# Patient Record
Sex: Female | Born: 1965
Health system: Southern US, Community
[De-identification: ages and names within clinical notes are randomized; demographics above are authoritative.]

## PROBLEM LIST (undated history)

## (undated) DIAGNOSIS — K8020 Calculus of gallbladder without cholecystitis without obstruction: Secondary | ICD-10-CM

## (undated) DIAGNOSIS — I10 Essential (primary) hypertension: Secondary | ICD-10-CM

## (undated) DIAGNOSIS — E785 Hyperlipidemia, unspecified: Secondary | ICD-10-CM

## (undated) DIAGNOSIS — E119 Type 2 diabetes mellitus without complications: Secondary | ICD-10-CM

## (undated) HISTORY — PX: TUBAL LIGATION: SHX77

## (undated) HISTORY — DX: Calculus of gallbladder without cholecystitis without obstruction: K80.20

## (undated) HISTORY — DX: Type 2 diabetes mellitus without complications: E11.9

## (undated) HISTORY — DX: Hyperlipidemia, unspecified: E78.5

## (undated) HISTORY — PX: CHOLECYSTECTOMY: SHX55

---

## 1997-07-08 ENCOUNTER — Encounter: Admission: RE | Admit: 1997-07-08 | Discharge: 1997-07-08 | Payer: Self-pay | Admitting: Family Medicine

## 1997-07-11 ENCOUNTER — Encounter: Admission: RE | Admit: 1997-07-11 | Discharge: 1997-07-11 | Payer: Self-pay | Admitting: Family Medicine

## 1997-09-26 ENCOUNTER — Emergency Department (HOSPITAL_COMMUNITY): Admission: EM | Admit: 1997-09-26 | Discharge: 1997-09-26 | Payer: Self-pay | Admitting: Emergency Medicine

## 1997-11-04 ENCOUNTER — Encounter: Admission: RE | Admit: 1997-11-04 | Discharge: 1997-11-04 | Payer: Self-pay | Admitting: Family Medicine

## 1997-11-25 ENCOUNTER — Encounter: Admission: RE | Admit: 1997-11-25 | Discharge: 1997-11-25 | Payer: Self-pay | Admitting: Family Medicine

## 1997-12-25 ENCOUNTER — Emergency Department (HOSPITAL_COMMUNITY): Admission: EM | Admit: 1997-12-25 | Discharge: 1997-12-25 | Payer: Self-pay | Admitting: Emergency Medicine

## 1997-12-25 ENCOUNTER — Encounter: Payer: Self-pay | Admitting: Emergency Medicine

## 1998-06-27 ENCOUNTER — Encounter: Admission: RE | Admit: 1998-06-27 | Discharge: 1998-06-27 | Payer: Self-pay | Admitting: Family Medicine

## 1998-07-17 ENCOUNTER — Encounter: Admission: RE | Admit: 1998-07-17 | Discharge: 1998-07-17 | Payer: Self-pay | Admitting: Family Medicine

## 1998-08-01 ENCOUNTER — Encounter: Admission: RE | Admit: 1998-08-01 | Discharge: 1998-08-01 | Payer: Self-pay | Admitting: Family Medicine

## 1998-09-14 ENCOUNTER — Encounter: Admission: RE | Admit: 1998-09-14 | Discharge: 1998-09-14 | Payer: Self-pay | Admitting: Family Medicine

## 1998-09-20 ENCOUNTER — Encounter: Admission: RE | Admit: 1998-09-20 | Discharge: 1998-09-20 | Payer: Self-pay | Admitting: Sports Medicine

## 1998-12-13 ENCOUNTER — Emergency Department (HOSPITAL_COMMUNITY): Admission: EM | Admit: 1998-12-13 | Discharge: 1998-12-13 | Payer: Self-pay | Admitting: Emergency Medicine

## 1999-07-31 ENCOUNTER — Encounter: Admission: RE | Admit: 1999-07-31 | Discharge: 1999-07-31 | Payer: Self-pay | Admitting: Family Medicine

## 1999-11-06 ENCOUNTER — Emergency Department (HOSPITAL_COMMUNITY): Admission: EM | Admit: 1999-11-06 | Discharge: 1999-11-06 | Payer: Self-pay | Admitting: Emergency Medicine

## 2000-11-28 ENCOUNTER — Encounter: Admission: RE | Admit: 2000-11-28 | Discharge: 2000-11-28 | Payer: Self-pay | Admitting: Family Medicine

## 2001-02-06 ENCOUNTER — Emergency Department (HOSPITAL_COMMUNITY): Admission: EM | Admit: 2001-02-06 | Discharge: 2001-02-06 | Payer: Self-pay | Admitting: Emergency Medicine

## 2001-04-23 ENCOUNTER — Encounter: Admission: RE | Admit: 2001-04-23 | Discharge: 2001-04-23 | Payer: Self-pay | Admitting: Family Medicine

## 2001-08-05 ENCOUNTER — Encounter: Admission: RE | Admit: 2001-08-05 | Discharge: 2001-08-05 | Payer: Self-pay | Admitting: Family Medicine

## 2001-09-01 ENCOUNTER — Inpatient Hospital Stay (HOSPITAL_COMMUNITY): Admission: AD | Admit: 2001-09-01 | Discharge: 2001-09-01 | Payer: Self-pay | Admitting: *Deleted

## 2001-09-11 ENCOUNTER — Encounter: Admission: RE | Admit: 2001-09-11 | Discharge: 2001-09-11 | Payer: Self-pay | Admitting: Family Medicine

## 2001-10-15 ENCOUNTER — Ambulatory Visit (HOSPITAL_COMMUNITY): Admission: RE | Admit: 2001-10-15 | Discharge: 2001-10-15 | Payer: Self-pay | Admitting: Family Medicine

## 2001-10-20 ENCOUNTER — Encounter: Admission: RE | Admit: 2001-10-20 | Discharge: 2001-10-20 | Payer: Self-pay | Admitting: Family Medicine

## 2001-10-27 ENCOUNTER — Inpatient Hospital Stay (HOSPITAL_COMMUNITY): Admission: RE | Admit: 2001-10-27 | Discharge: 2001-10-27 | Payer: Self-pay | Admitting: *Deleted

## 2001-12-09 ENCOUNTER — Encounter: Admission: RE | Admit: 2001-12-09 | Discharge: 2001-12-09 | Payer: Self-pay | Admitting: Family Medicine

## 2001-12-09 ENCOUNTER — Inpatient Hospital Stay (HOSPITAL_COMMUNITY): Admission: AD | Admit: 2001-12-09 | Discharge: 2001-12-09 | Payer: Self-pay | Admitting: Obstetrics and Gynecology

## 2001-12-09 ENCOUNTER — Encounter: Payer: Self-pay | Admitting: Obstetrics and Gynecology

## 2002-01-08 ENCOUNTER — Encounter: Admission: RE | Admit: 2002-01-08 | Discharge: 2002-01-08 | Payer: Self-pay | Admitting: Family Medicine

## 2002-01-15 ENCOUNTER — Encounter: Admission: RE | Admit: 2002-01-15 | Discharge: 2002-01-15 | Payer: Self-pay | Admitting: Family Medicine

## 2002-01-19 ENCOUNTER — Encounter: Admission: RE | Admit: 2002-01-19 | Discharge: 2002-04-19 | Payer: Self-pay | Admitting: *Deleted

## 2002-01-26 ENCOUNTER — Encounter: Admission: RE | Admit: 2002-01-26 | Discharge: 2002-01-26 | Payer: Self-pay | Admitting: Family Medicine

## 2002-02-08 ENCOUNTER — Encounter: Admission: RE | Admit: 2002-02-08 | Discharge: 2002-02-08 | Payer: Self-pay | Admitting: Family Medicine

## 2002-02-11 ENCOUNTER — Ambulatory Visit (HOSPITAL_COMMUNITY): Admission: RE | Admit: 2002-02-11 | Discharge: 2002-02-11 | Payer: Self-pay | Admitting: Family Medicine

## 2002-02-11 ENCOUNTER — Encounter: Payer: Self-pay | Admitting: Family Medicine

## 2002-02-22 ENCOUNTER — Encounter (INDEPENDENT_AMBULATORY_CARE_PROVIDER_SITE_OTHER): Payer: Self-pay | Admitting: Specialist

## 2002-02-22 ENCOUNTER — Inpatient Hospital Stay (HOSPITAL_COMMUNITY): Admission: AD | Admit: 2002-02-22 | Discharge: 2002-02-26 | Payer: Self-pay | Admitting: *Deleted

## 2002-02-23 ENCOUNTER — Encounter: Payer: Self-pay | Admitting: *Deleted

## 2002-02-24 ENCOUNTER — Encounter: Payer: Self-pay | Admitting: *Deleted

## 2002-02-24 ENCOUNTER — Encounter: Payer: Self-pay | Admitting: Family Medicine

## 2002-02-28 ENCOUNTER — Inpatient Hospital Stay (HOSPITAL_COMMUNITY): Admission: AD | Admit: 2002-02-28 | Discharge: 2002-02-28 | Payer: Self-pay | Admitting: Obstetrics and Gynecology

## 2002-02-28 ENCOUNTER — Encounter: Payer: Self-pay | Admitting: Obstetrics and Gynecology

## 2002-03-03 ENCOUNTER — Encounter: Admission: RE | Admit: 2002-03-03 | Discharge: 2002-03-03 | Payer: Self-pay | Admitting: Family Medicine

## 2002-03-25 ENCOUNTER — Encounter (INDEPENDENT_AMBULATORY_CARE_PROVIDER_SITE_OTHER): Payer: Self-pay | Admitting: *Deleted

## 2002-03-25 LAB — CONVERTED CEMR LAB

## 2002-04-09 ENCOUNTER — Encounter: Admission: RE | Admit: 2002-04-09 | Discharge: 2002-04-09 | Payer: Self-pay | Admitting: Family Medicine

## 2002-04-09 ENCOUNTER — Other Ambulatory Visit: Admission: RE | Admit: 2002-04-09 | Discharge: 2002-04-09 | Payer: Self-pay | Admitting: Family Medicine

## 2002-04-28 ENCOUNTER — Encounter: Admission: RE | Admit: 2002-04-28 | Discharge: 2002-04-28 | Payer: Self-pay | Admitting: Family Medicine

## 2003-11-04 ENCOUNTER — Encounter: Admission: RE | Admit: 2003-11-04 | Discharge: 2003-11-04 | Payer: Self-pay | Admitting: Family Medicine

## 2003-11-07 ENCOUNTER — Encounter: Admission: RE | Admit: 2003-11-07 | Discharge: 2003-11-07 | Payer: Self-pay | Admitting: Family Medicine

## 2004-04-26 ENCOUNTER — Other Ambulatory Visit: Admission: RE | Admit: 2004-04-26 | Discharge: 2004-04-26 | Payer: Self-pay | Admitting: Family Medicine

## 2004-04-26 ENCOUNTER — Ambulatory Visit: Payer: Self-pay | Admitting: Family Medicine

## 2004-12-14 ENCOUNTER — Ambulatory Visit: Payer: Self-pay | Admitting: Family Medicine

## 2005-03-06 ENCOUNTER — Emergency Department (HOSPITAL_COMMUNITY): Admission: EM | Admit: 2005-03-06 | Discharge: 2005-03-06 | Payer: Self-pay | Admitting: Emergency Medicine

## 2005-08-07 ENCOUNTER — Emergency Department (HOSPITAL_COMMUNITY): Admission: EM | Admit: 2005-08-07 | Discharge: 2005-08-07 | Payer: Self-pay | Admitting: Emergency Medicine

## 2006-01-11 ENCOUNTER — Emergency Department (HOSPITAL_COMMUNITY): Admission: EM | Admit: 2006-01-11 | Discharge: 2006-01-11 | Payer: Self-pay | Admitting: Emergency Medicine

## 2006-05-23 ENCOUNTER — Encounter (INDEPENDENT_AMBULATORY_CARE_PROVIDER_SITE_OTHER): Payer: Self-pay | Admitting: *Deleted

## 2006-07-23 ENCOUNTER — Emergency Department (HOSPITAL_COMMUNITY): Admission: EM | Admit: 2006-07-23 | Discharge: 2006-07-23 | Payer: Self-pay | Admitting: Emergency Medicine

## 2007-02-24 ENCOUNTER — Emergency Department (HOSPITAL_COMMUNITY): Admission: EM | Admit: 2007-02-24 | Discharge: 2007-02-24 | Payer: Self-pay | Admitting: Emergency Medicine

## 2007-02-25 ENCOUNTER — Emergency Department (HOSPITAL_COMMUNITY): Admission: EM | Admit: 2007-02-25 | Discharge: 2007-02-25 | Payer: Self-pay | Admitting: Emergency Medicine

## 2007-02-26 ENCOUNTER — Emergency Department (HOSPITAL_COMMUNITY): Admission: EM | Admit: 2007-02-26 | Discharge: 2007-02-26 | Payer: Self-pay | Admitting: Family Medicine

## 2007-09-03 ENCOUNTER — Emergency Department (HOSPITAL_COMMUNITY): Admission: EM | Admit: 2007-09-03 | Discharge: 2007-09-03 | Payer: Self-pay | Admitting: Emergency Medicine

## 2007-09-07 ENCOUNTER — Emergency Department (HOSPITAL_COMMUNITY): Admission: EM | Admit: 2007-09-07 | Discharge: 2007-09-07 | Payer: Self-pay | Admitting: Emergency Medicine

## 2008-06-11 ENCOUNTER — Emergency Department (HOSPITAL_COMMUNITY): Admission: EM | Admit: 2008-06-11 | Discharge: 2008-06-11 | Payer: Self-pay | Admitting: Emergency Medicine

## 2008-06-13 ENCOUNTER — Emergency Department (HOSPITAL_COMMUNITY): Admission: EM | Admit: 2008-06-13 | Discharge: 2008-06-13 | Payer: Self-pay | Admitting: Emergency Medicine

## 2009-05-04 ENCOUNTER — Emergency Department (HOSPITAL_COMMUNITY): Admission: EM | Admit: 2009-05-04 | Discharge: 2009-05-04 | Payer: Self-pay | Admitting: Emergency Medicine

## 2010-03-14 ENCOUNTER — Ambulatory Visit: Payer: Self-pay | Admitting: Family Medicine

## 2010-03-27 ENCOUNTER — Encounter: Payer: Self-pay | Admitting: Family Medicine

## 2010-03-27 DIAGNOSIS — F17200 Nicotine dependence, unspecified, uncomplicated: Secondary | ICD-10-CM | POA: Insufficient documentation

## 2010-03-27 DIAGNOSIS — E669 Obesity, unspecified: Secondary | ICD-10-CM | POA: Insufficient documentation

## 2010-03-27 DIAGNOSIS — R5383 Other fatigue: Secondary | ICD-10-CM

## 2010-03-27 DIAGNOSIS — L301 Dyshidrosis [pompholyx]: Secondary | ICD-10-CM | POA: Insufficient documentation

## 2010-03-27 DIAGNOSIS — R5381 Other malaise: Secondary | ICD-10-CM | POA: Insufficient documentation

## 2010-03-29 LAB — CONVERTED CEMR LAB
ALT: 14 units/L (ref 0–35)
AST: 12 units/L (ref 0–37)
Albumin: 4.5 g/dL (ref 3.5–5.2)
Alkaline Phosphatase: 64 units/L (ref 39–117)
BUN: 17 mg/dL (ref 6–23)
Basophils Absolute: 0 10*3/uL (ref 0.0–0.1)
Basophils Relative: 0 % (ref 0–1)
CO2: 25 meq/L (ref 19–32)
Calcium: 9.6 mg/dL (ref 8.4–10.5)
Chloride: 103 meq/L (ref 96–112)
Direct LDL: 156 mg/dL — ABNORMAL HIGH
Eosinophils Absolute: 0.1 10*3/uL (ref 0.0–0.7)
Glucose, Bld: 106 mg/dL — ABNORMAL HIGH (ref 70–99)
HCT: 42.1 % (ref 36.0–46.0)
Hemoglobin: 13.4 g/dL (ref 12.0–15.0)
Lymphocytes Relative: 35 % (ref 12–46)
Lymphs Abs: 2 10*3/uL (ref 0.7–4.0)
MCHC: 31.8 g/dL (ref 30.0–36.0)
Monocytes Absolute: 0.5 10*3/uL (ref 0.1–1.0)
Monocytes Relative: 8 % (ref 3–12)
Neutro Abs: 3.1 10*3/uL (ref 1.7–7.7)
Neutrophils Relative %: 55 % (ref 43–77)
Potassium: 4.3 meq/L (ref 3.5–5.3)
RBC: 4.83 M/uL (ref 3.87–5.11)
RDW: 14.6 % (ref 11.5–15.5)
Sodium: 137 meq/L (ref 135–145)
TSH: 2.768 microintl units/mL (ref 0.350–4.500)
Total Bilirubin: 0.3 mg/dL (ref 0.3–1.2)
Total Protein: 7.7 g/dL (ref 6.0–8.3)
WBC: 5.6 10*3/uL (ref 4.0–10.5)

## 2010-04-26 NOTE — Assessment & Plan Note (Signed)
Summary: np/son Lisa Sullivan davis sees Jenisha Faison/eo   Vital Signs:  Patient profile:   45 year old female Height:      63 inches Weight:      215.2 pounds BMI:     38.26 Temp:     98.7 degrees F oral Pulse rate:   76 / minute BP sitting:   121 / 85  (left arm) Cuff size:   regular  Vitals Entered By: Garen Grams LPN (March 27, 2010 8:48 AM) CC: New Patient Is Patient Diabetic? No Pain Assessment Patient in pain? no        Primary Care Provider:  Antoine Primas DO  CC:  New Patient.  History of Present Illness: 45 yo female here to establish care  1.  Preventitive-  Pt would like a mammogram and would like to have labs done.   Pt is due for a pap smear,  no abnormal pap smear in the past last one though was > 3 yrs ago.   Pt is still smoking but only anbout 4-5 cigs daily.  Pt has quit in the past for about 6 months at one point.  Pt does not feel she can set a quit date at this time, pt though would like to quit cold [place] when she triess  Pt has had some dizziness from time to time, no time of day specific, no meds, or supplements, not associated with food no shortness of breath or chest pain involved, no LOC, pt states it has only occured a couple of times.  Pt scared it may be her blood pressure.   obesity- pt is still overweight but is happy with her weight but states she is tired a lot of the time and contributes it to her weight.   Habits & Providers  Alcohol-Tobacco-Diet     Tobacco Status: current     Tobacco Counseling: to quit use of tobacco products     Cigarette Packs/Day: <0.25  Current Medications (verified): 1)  Hydrocortisone Acetate 1 % Crea (Hydrocortisone Acetate) .... Apply To Hands Nightly  Allergies (verified): No Known Drug Allergies PMH-FH-SH reviewed-no changes except otherwise noted  Social History: 1/4 ppd  Lives with boyfriend, Lisa Sullivan,  and  son Junius Finner. +cocaine in UDS in pregnancy but denies use.  Has older daugther (b 40 or so) and 2  grandchildren. Unemployed.Smoking Status:  current Packs/Day:  <0.25  Review of Systems       see hpi  Physical Exam  General:  NAD pleasant Eyes:  PERRL, EOMi Ears:  TM intact b/l non bulging no erythema Mouth:  MMM Neck:  mild goiter Lungs:  CTAB Heart:  RRR no murmur Abdomen:  BS+, NT ND Pulses:  2+ Extremities:  no edema hands- dry hands with eczema, mild around mouth as well.  Neurologic:  CN 2-12 intact   Impression & Recommendations:  Problem # 1:  FATIGUE (ICD-780.79) pt appears to have some mild fatigue, no red flags, will get labs to see how pt is doing, if still being tired may consider a sleep study as well in the future.  Orders: CBC w/Diff-[facility] (60454) TSH-[facility] ([id number])  Problem # 2:  OBESITY, UNSPECIFIED (ICD-278.00) get labs, pt seems content with her weight at present time.  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  If after treatment still not better will consider derm consult but have some other regimens to try first.   Complete Medication List: 1)  Hydrocortisone Acetate 1 % Crea (Hydrocortisone acetate) .... Apply to hands nightly  Other Orders: Mammogram (Screening) (Mammo) Pap Smear-[facility] ([id number])  Patient Instructions: 1)  Nice to meet you 2)  We will get some labs and I will call you with the results 3)  We did a pap smear today, I will call you if anything is abnormal otherwise no news is good news. 4)  Keep trying to quit smnoking it is the best you can do for your health 5)  For your hands, try hydrocortisone cream apply to hands nightly, you can put on your face three  times a week 6)  I want to see you again in 3-4 months to see how you are doing with smoking and your weight.    Orders Added: 1)  Comp Met-[facility] [[id number]] 2)  Direct LDL-[facility] [[id number]] 3)  CBC w/Diff-[facility] [85025] 4)  TSH-[facility] [[id number]] 5)  Mammogram (Screening) [Mammo] 6)  Pap Smear-[facility] [[id number]] 7)  [facility] [99203]

## 2010-06-17 ENCOUNTER — Inpatient Hospital Stay (HOSPITAL_COMMUNITY)
Admission: AD | Admit: 2010-06-17 | Discharge: 2010-06-17 | Disposition: A | Discharge status: Home / Self Care | Payer: Self-pay | Source: Ambulatory Visit | Attending: Obstetrics & Gynecology | Admitting: Obstetrics & Gynecology

## 2010-06-17 DIAGNOSIS — K219 Gastro-esophageal reflux disease without esophagitis: Secondary | ICD-10-CM | POA: Insufficient documentation

## 2010-06-17 DIAGNOSIS — R1013 Epigastric pain: Secondary | ICD-10-CM | POA: Insufficient documentation

## 2010-06-17 LAB — URINALYSIS, ROUTINE W REFLEX MICROSCOPIC
Leukocytes, UA: NEGATIVE
Protein, ur: NEGATIVE mg/dL
Urobilinogen, UA: 0.2 mg/dL (ref 0.0–1.0)

## 2010-06-17 LAB — URINE MICROSCOPIC-ADD ON

## 2010-07-05 LAB — URINE MICROSCOPIC-ADD ON

## 2010-07-05 LAB — URINALYSIS, ROUTINE W REFLEX MICROSCOPIC
Glucose, UA: NEGATIVE mg/dL
Ketones, ur: NEGATIVE mg/dL
pH: 6 (ref 5.0–8.0)

## 2010-07-27 ENCOUNTER — Inpatient Hospital Stay (INDEPENDENT_AMBULATORY_CARE_PROVIDER_SITE_OTHER)
Admission: RE | Admit: 2010-07-27 | Discharge: 2010-07-27 | Disposition: A | Discharge status: Home / Self Care | Payer: Self-pay | Source: Ambulatory Visit | Attending: Family Medicine | Admitting: Family Medicine

## 2010-07-27 ENCOUNTER — Ambulatory Visit (INDEPENDENT_AMBULATORY_CARE_PROVIDER_SITE_OTHER): Payer: Self-pay

## 2010-07-27 DIAGNOSIS — S82899A Other fracture of unspecified lower leg, initial encounter for closed fracture: Secondary | ICD-10-CM

## 2010-08-10 NOTE — Op Note (Signed)
NAME:  Lisa Sullivan, Lisa Sullivan                         ACCOUNT NO.:  [id number]   MEDICAL RECORD NO.:  [id number]                   PATIENT TYPE:  INP   LOCATION:  9128                                 FACILITY:  [facility]   PHYSICIAN:  Mary Sella. Orlene Erm, M.D.                 DATE OF BIRTH:  [birth year]   DATE OF PROCEDURE:  02/22/2002  DATE OF DISCHARGE:                                 OPERATIVE REPORT   PREOPERATIVE DIAGNOSES:  A 45 year old black female at 37 weeks 2 days with  probable abruption and undesired fertility, with a previous low transverse  cesarean section, desiring repeat low transverse cesarean section.   POSTOPERATIVE DIAGNOSES:  A 45 year old black female at 37 weeks 2 days with  probable abruption and undesired fertility, with a previous low transverse  cesarean section, desiring repeat low transverse cesarean section.   PROCEDURES:  1. Repeat low transverse cesarean section.  2. Bilateral tubal ligation.   SURGEON:  Mary Sella. Orlene Erm, M.D.   ASSISTANTMichele Mcalpine D. Okey Dupre, M.D.   ANESTHESIA:  Spinal.   COMPLICATIONS:  None.   ESTIMATED BLOOD LOSS:  1000 cc.   FINDINGS:  Viable female infant delivered at 12:52 p.m. with Apgars of 3 and  7.  Dense adhesions of the uterus to the anterior abdominal wall and dense  fascial scar.  Filmy adhesions of the tubes and ovaries.   INDICATION FOR PROCEDURE:  The patient is a 45 year old black female who  presents to [facility] with history of sudden onset of heavy vaginal  bleeding.  The patient was found to have bright red blood per vagina at the  time of admission.  The patient was not contracting at the time but was  complaining of sharp abdominal pain over her cesarean site.  The patient had  reassuring fetal monitoring at that time.  She was admitted and started on  IV fluids.  DIC profile was negative.  The patient was counseled on the  risks and benefits of delivering at 37 weeks 2 days with regard to  particularly prematurity  issues.  The patient was offered amniocentesis but  declined.  The decision was made with the patient to proceed with repeat low  transverse C-section followed by tubal ligation.   DESCRIPTION OF PROCEDURE:  The patient was taken to the operating room,  where she was given spinal anesthesia.  She was prepped and draped in a  sterile fashion.  A Pfannenstiel incision was performed with a scalpel and  carried down to the underlying fascia with the Bovie.  The fascia was  incised and the incision was extended laterally.  The fascia was separated  from the midline rectus muscle bellies with sharp and blunt dissection.  The  peritoneum was entered sharply and extended superiorly and inferiorly.  The  bladder blade was placed, and a bladder flap was created with sharp and  blunt dissection.  A low uterine incision was made with a scalpel and  carried down to the uterine cavity.  The placenta was anterior and the  uterine incision entered through the placenta.  The amniotic membranes were  ruptured with Allis clamp, and fluid was clear.  The head was asynclitic,  and there was difficulty with getting the head to come through the uterine  incision.  An attempt was made with the vacuum to place on the fetal head.  This was abandoned after two pop-offs and Kjelland forceps were placed on  the infant's head.  The infant's head was easily delivered through the  operative incision.  The body was delivered and the cord was clamped and  cut.  The infant was handed off to the awaiting neonatal resuscitation team.  The time from the uterine incision to time of delivery was approximately  three to four minutes.  The placenta was then manually extracted and the  uterus was mobilized by dissecting the dense adhesions to the anterior  abdominal wall.  The uterus was then externalized and the uterus was  curetted with a dry lap sponge.  The uterine incision was closed with a  running stitch of 0 chromic.  A  second imbricating suture was used to attain  hemostasis.  The final hemostasis was obtained with a figure-of-eight suture  on the left corner.  The patient's fallopian tubes were identified and  grasped with Babcock clamps.  A 0 plain gut suture was placed around the  tube and a 1 cm segment of tube was excised.  The tubes were hemostatic and  returned to the abdominal cavity.  The paracolic gutters were emptied of  clot and debris, and the tubes were inspected and noted to be hemostatic  again.  The uterine incision was inspected and noted to be hemostatic.  The  fascia was closed with a running stitch of 0 Vicryl.  The subcutaneous  tissues were irrigated with copious amounts of saline and the skin edges  were reapproximated with staples.  The patient tolerated the procedure well  and returned to the recovery room in stable condition.                                               Mary Sella. Orlene Erm, M.D.    [facility]  D:  02/22/2002  T:  02/22/2002  Job:  [id number]

## 2010-08-10 NOTE — Discharge Summary (Signed)
NAME:  Lisa Sullivan, Lisa Sullivan                         ACCOUNT NO.:  [id number]   MEDICAL RECORD NO.:  [id number]                   PATIENT TYPE:  INP   LOCATION:  9141                                 FACILITY:  [facility]   PHYSICIAN:  Bradly Bienenstock, M.D.                   DATE OF BIRTH:  1965-09-15   DATE OF ADMISSION:  02/22/2002  DATE OF DISCHARGE:  02/26/2002                                 DISCHARGE SUMMARY   DISCHARGE DIAGNOSES:  1. Intrauterine pregnancy at 37 weeks and 2 days, delivered of a viable     female.  2. Status post repeat low transverse cesarean section with bilateral tubal     ligation.  3. Placental abruption likely secondary to cocaine use.  4. Substance abuse, cocaine positive urine.  5. Pulmonary edema postoperatively with hypoxia - resolved.  6. Postoperative fevers believed secondary to respiratory     infection/pneumonia.  7. Anemia, discharge hemoglobin 8.9.   CONSULTS WHILE IN HOSPITAL:  Social work.  Child Protective Services was  contacted secondary to cocaine positive urine in pregnancy.   FOLLOW UP:  The patient is to follow up next week at the [facility].   DISCHARGE MEDICATIONS:  1. Percocet 5/325 1-2 tablets p.o. q.4h p.r.n. severe pain.  2. Ibuprofen 600 mg one tablet q.6h p.r.n. pain.  3. Azithromycin 260 mg one p.o. daily x3 days.  4. Albuterol MDI 2 puffs q.4h p.r.n.  5. Iron 325 mg one tablet p.o. daily.  6. Mylanta chewable gas pills p.r.n.  7. Over-the-counter stool softeners p.r.n.   HISTORY OF PRESENT ILLNESS:  This is a 45 year old G4, P1, 0, 2, 1, who  presents at 37 weeks and one day, dated by LMP and an 18-week ultrasound,  with the chief complaint of heavy vaginal bleeding.  The patient had been  transported to the hospital by EMS.  EMS arrived at the patient's home after  receiving a call about her vaginal bleeding and reported a blood pressure  elevated to 200/100 on site.  The patient was started on IV  antihypertensives and transported to [facility].   PREGNANCY COURSE/RISKS:  The patient was at advanced maternal age, she did  have a normal amniocentesis at 18 weeks.  The patient was Rh negative,  otherwise uncomplicated prenatal course.   OBSTETRICAL HISTORY:  The patient had one prior term delivery in 1982, a low  transverse cesarean section of a female infant, 4 pounds, 5 ounces.  The  patient states that she went into a coma after this, became very ill  postoperatively.  On examination of the records, patient did have what  sounds like a significant postoperative endometritis after her last delivery  but no loss of consciousness, did have an extended inpatient stay for IV  antibiotics.   GYNECOLOGIC HISTORY:  All normal Pap smears.   MEDICAL AND SURGICAL HISTORY:  Only  a C-section and a history of urinary  tract infections.   SOCIAL HISTORY:  The patient does smoke half a pack per day.  Denied alcohol  or drug use but was subsequently found to be cocaine positive and did  eventually admit to use of cocaine.   PRENATAL LABS:  Blood type A negative, antibody screen negative, rubella  immune, hepatitic B, syphilis, HIV, GC, chlamydia and GBS all negative.  She  had a normal triple screen and normal amniocentesis.   PHYSICAL EXAMINATION:  VITAL SIGNS: On admission temperature was 97.2, pulse  89, blood pressure 151/82.  GENERAL: This is an anxious patient who is alert and oriented.  CARDIOVASCULAR: Regular rate and rhythm without murmurs.  LUNGS: Clear to auscultation bilaterally.  ABDOMEN: Gravid, nontender.  EXTREMITIES: No extremity edema.  DIGITAL EXAM: Closed, 50% effaced and high.  Fetal heart tones baseline 140,  average variability was reactive and no decelerations, there was slight  uterine irritability.   PLAN:  The patient was admitted for management of her placental abruption.   HOSPITAL COURSE:  The patient's vaginal bleeding continued, therefore it was   decided to take the patient to the operating room for delivery on 02/22/02.  She was delivered of a viable female at that time, and also underwent  bilateral tubal ligation.   POSTOPERATIVE COURSE:  The patient continued to have somewhat high blood  pressures and 24 hours postoperatively she did have a temperature to 102  with decreased oxygenation to 87% on room air.  A chest x-ray was done.  Blood cultures and respiratory cultures were all done.  The patient was  started on Ancef therapy.  On chest x-ray patient was found to have  pulmonary edema which was believed secondary to her cocaine use.  She was  given IV Lasix with improvement of her oxygenation by  Postoperative day three.  The patient also did have a fever and as her fever  did begin fairly soon after her operation, it was felt that likely this was  not secondary to endometritis but this was more likely to be a pulmonary  infection, therefore azithromycin was added to the patient's antibiotics.  She did spend three days in the adult intensive care unit where she slowly  stabilized.  While in the intensive care she was seen by the social worker  and Child Protective Services was consulted.  With the patient's continued mild hypoxia, it was decided to do a spiral CT  to rule out pulmonary embolus and this was negative for PE, this was  performed on 02/24/02.  By 02/25/02 the patient had improved on her  oxygenation, her blood pressures had returned to normal range and fevers had  resolved.  Her last fever spike was on 02/24/02.  Therefore she was moved out  of the adult ICU.  She continued to do well and was discharged on 02/26/02.   DISCHARGE LABS:  Hemoglobin 8.9, platelets 161,000, white count 5900.  Complete metabolic panel performed on 02/24/02 was normal except for a  slightly low calcium of 7.3, total protein of 5.2, albumin of 2.2.  AST of 50, ALT 24, alkaline phosphatase 115, total bilirubin 0.1, LDH 270,  magnesium 1.8, uric  acid 4.3, creatinine was 0.8.  Blood culture showed no  growth.  The patient was discharged in good condition and is to follow up in the  family practice center.  Bradly Bienenstock, M.D.   Arliss Journey  D:  07/05/2002  T:  07/05/2002  Job:  [id number]

## 2010-12-20 LAB — POCT I-STAT, CHEM 8
BUN: 7
Calcium, Ion: 1.07 — ABNORMAL LOW
Chloride: 100
Creatinine, Ser: 1.3 — ABNORMAL HIGH
Glucose, Bld: 161 — ABNORMAL HIGH
HCT: 40
Hemoglobin: 13.6
Potassium: 3.4 — ABNORMAL LOW
Sodium: 130 — ABNORMAL LOW
TCO2: 22

## 2010-12-20 LAB — URINE MICROSCOPIC-ADD ON

## 2010-12-20 LAB — DIFFERENTIAL
Basophils Absolute: 0
Basophils Relative: 0
Eosinophils Absolute: 0
Eosinophils Relative: 0
Lymphocytes Relative: 4 — ABNORMAL LOW
Lymphs Abs: 0.5 — ABNORMAL LOW
Monocytes Absolute: 0.9
Monocytes Relative: 6
Neutro Abs: 13.4 — ABNORMAL HIGH
Neutrophils Relative %: 90 — ABNORMAL HIGH

## 2010-12-20 LAB — URINALYSIS, ROUTINE W REFLEX MICROSCOPIC
Glucose, UA: NEGATIVE
Ketones, ur: 15 — AB
Leukocytes, UA: NEGATIVE
Nitrite: NEGATIVE
Protein, ur: 100 — AB
Specific Gravity, Urine: 1.031 — ABNORMAL HIGH
Urobilinogen, UA: 1
pH: 5.5

## 2010-12-20 LAB — CBC
HCT: 37.4
Hemoglobin: 12.8
MCHC: 34.2
MCV: 83.8
Platelets: 200
RBC: 4.47
RDW: 13.4
WBC: 14.9 — ABNORMAL HIGH

## 2010-12-31 LAB — CBC
HCT: 38.1
MCV: 84.7
Platelets: 248
RBC: 4.5
WBC: 11.1 — ABNORMAL HIGH

## 2010-12-31 LAB — DIFFERENTIAL
Eosinophils Absolute: 0.2
Lymphocytes Relative: 23
Lymphs Abs: 2.5
Monocytes Relative: 9
Neutrophils Relative %: 66

## 2010-12-31 LAB — CULTURE, ROUTINE-ABSCESS

## 2011-02-10 ENCOUNTER — Encounter: Payer: Self-pay | Admitting: *Deleted

## 2011-02-10 ENCOUNTER — Emergency Department (HOSPITAL_COMMUNITY)
Admission: EM | Admit: 2011-02-10 | Discharge: 2011-02-10 | Disposition: A | Discharge status: Home / Self Care | Payer: Self-pay | Attending: Emergency Medicine | Admitting: Emergency Medicine

## 2011-02-10 DIAGNOSIS — H5789 Other specified disorders of eye and adnexa: Secondary | ICD-10-CM | POA: Insufficient documentation

## 2011-02-10 DIAGNOSIS — H109 Unspecified conjunctivitis: Secondary | ICD-10-CM

## 2011-02-10 LAB — GLUCOSE, CAPILLARY: Glucose-Capillary: 92 mg/dL (ref 70–99)

## 2011-02-10 MED ORDER — FLUORESCEIN SODIUM 1 MG OP STRP
1.0000 | ORAL_STRIP | Freq: Once | OPHTHALMIC | Status: AC
  Administered 2011-02-10: 1 via OPHTHALMIC
  Filled 2011-02-10: qty 2

## 2011-02-10 MED ORDER — TETRACAINE HCL 0.5 % OP SOLN
1.0000 [drp] | Freq: Once | OPHTHALMIC | Status: AC
  Administered 2011-02-10: 1 [drp] via OPHTHALMIC
  Filled 2011-02-10: qty 2

## 2011-02-10 MED ORDER — ERYTHROMYCIN 5 MG/GM OP OINT: TOPICAL_OINTMENT | OPHTHALMIC | Status: AC

## 2011-02-10 NOTE — ED Provider Notes (Signed)
History     CSN: [id number] Arrival date & time: 02/10/2011 12:31 PM   First MD Initiated Contact with Patient 02/10/11 1304      Chief Complaint  Patient presents with  . Eye Problem    (Consider location/radiation/quality/duration/timing/severity/associated sxs/prior treatment) HPI Comments: Patient woke up this morning with bilateral eye swelling shut as well as crusting of both eyelids. She was not able to open her left eye until warm compress was applied. She has irritation and drainage coming from both eyes. Photophobia and headache as well. She denies a change in vision or any recent eye trauma. 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 The patient does not wear contact lenses and feel a foreign body sensation.  Patient is a 45 y.o. female presenting with eye problem. The history is provided by the patient.  Eye Problem  Associated symptoms include discharge, photophobia and eye redness. Pertinent negatives include no numbness and no weakness.    History reviewed. No pertinent past medical history.  History reviewed. No pertinent past surgical history.  History reviewed. No pertinent family history.  History  Substance Use Topics  . Smoking status: Current Everyday Smoker  . Smokeless tobacco: Not on file  . Alcohol Use: Yes    OB History    Grav Para Term Preterm Abortions TAB SAB Ect Mult Living                  Review of Systems  Constitutional: Negative for fever, chills and appetite change.  HENT: Negative for congestion.   Eyes: Positive for photophobia, pain, discharge and redness. Negative for visual disturbance.  Respiratory: Negative for shortness of breath.   Cardiovascular: Negative for chest pain and leg swelling.  Gastrointestinal: Negative for abdominal pain.  Genitourinary: Negative for dysuria, urgency and frequency.  Neurological: Negative for dizziness, syncope, weakness,  light-headedness, numbness and headaches.  Psychiatric/Behavioral: Negative for confusion.    Allergies  Sulfa antibiotics  Home Medications   Current Outpatient Rx  Name Route Sig Dispense Refill  . IBUPROFEN 200 MG PO TABS Oral Take 800 mg by mouth every 6 (six) hours as needed. Pain     . TETRAHYDROZOLINE-ZN SULFATE 0.05-0.25 % OP SOLN Both Eyes Place 2 drops into both eyes 3 (three) times daily as needed. Dry Eyes       BP 158/102  Pulse 69  Temp 98.2 F (36.8 C)  Resp 18  SpO2 100%  Physical Exam  Nursing note and vitals reviewed. Constitutional: She is oriented to person, place, and time. She appears well-developed and well-nourished. No distress.  HENT:  Head: Normocephalic and atraumatic.  Eyes: EOM are normal. Pupils are equal, round, and reactive to light. No foreign bodies found. Right eye exhibits discharge and exudate. Right eye exhibits no hordeolum. No foreign body present in the right eye. Left eye exhibits discharge and exudate. Left eye exhibits no hordeolum. No foreign body present in the left eye. Right conjunctiva is injected. Right conjunctiva has no hemorrhage. Left conjunctiva is injected. Left conjunctiva has no hemorrhage. Scleral icterus is present.       Normal appearance  Neck: Normal range of motion.  Neurological: She is alert and oriented to person, place, and time.  Psychiatric: She has a normal mood and affect. Her behavior is normal.    ED Course  Procedures (including critical care time)   Labs Reviewed  POCT CBG MONITORING   No  results found.   No diagnosis found. Woods lamp done with fluoro and tetracaine drops. NO corneal abrasions seen, no foreign bodies present   MDM  Conjunctivitis        Jaci Carrel, [place] 02/10/11 1422

## 2011-02-10 NOTE — ED Notes (Signed)
bilat eye redness, left eye swelling

## 2011-02-10 NOTE — ED Provider Notes (Signed)
Medical screening examination/treatment/procedure(s) were performed by non-physician practitioner and as supervising physician I was immediately available for consultation/collaboration.   Andrew King, MD 02/10/11 1555 

## 2011-02-28 ENCOUNTER — Encounter: Payer: Self-pay | Admitting: Family Medicine

## 2011-02-28 ENCOUNTER — Ambulatory Visit (INDEPENDENT_AMBULATORY_CARE_PROVIDER_SITE_OTHER): Payer: Self-pay | Admitting: Family Medicine

## 2011-02-28 DIAGNOSIS — E049 Nontoxic goiter, unspecified: Secondary | ICD-10-CM

## 2011-02-28 DIAGNOSIS — E669 Obesity, unspecified: Secondary | ICD-10-CM

## 2011-02-28 DIAGNOSIS — R5381 Other malaise: Secondary | ICD-10-CM

## 2011-02-28 DIAGNOSIS — I10 Essential (primary) hypertension: Secondary | ICD-10-CM

## 2011-02-28 DIAGNOSIS — F17200 Nicotine dependence, unspecified, uncomplicated: Secondary | ICD-10-CM

## 2011-02-28 LAB — BASIC METABOLIC PANEL
CO2: 23 mEq/L (ref 19–32)
Calcium: 8.8 mg/dL (ref 8.4–10.5)
Chloride: 108 mEq/L (ref 96–112)
Glucose, Bld: 82 mg/dL (ref 70–99)
Sodium: 139 mEq/L (ref 135–145)

## 2011-02-28 LAB — T4, FREE: Free T4: 1.17 ng/dL (ref 0.80–1.80)

## 2011-02-28 MED ORDER — HYDROCHLOROTHIAZIDE 12.5 MG PO CAPS: 12.5000 mg | ORAL_CAPSULE | Freq: Every day | ORAL | Status: DC

## 2011-02-28 NOTE — Assessment & Plan Note (Signed)
Patient has had 2 readings have significant elevation one here with her dentist office. I think it is time to start a diuretic we will start hydrochlorothiazide 12.5 mg we will also get a basic metabolic panel to make sure there is no renal dysfunction. Patient's rectal exam today was normal we'll continue to monitor.

## 2011-02-28 NOTE — Assessment & Plan Note (Signed)
Encourage patient to continue to lose weight encouraged patient to continue to watch her diet. Patient declined any type of nutrition counseling and will continue to work this on her own. Patient was not willing to set any  goals today.

## 2011-02-28 NOTE — Progress Notes (Signed)
  Subjective:    Patient ID: Lisa Sullivan, female    DOB: 1965/07/15, 45 y.o.   MRN: [id number]  HPI Obesity- Wt Readings from Last 3 Encounters:  02/28/11 216 lb (97.977 kg)  03/27/10 215 lb 3.2 oz (97.614 kg)   Continues to have problems keeping her weight down. Patient states that she just to PT to work out to attempt to watch her diet but over the course of the holiday season she's been eating much worse. Patient does not have regular exercise routine and declined any type of nutrition counseling at this time. Vision is goal weight of 175 pounds but does not know a realistic time frame for this.  High blood pressure-patient was seen by a dentist recently and had significant pressure at that time he was told to follow up with [place]. Patient's blood pressure today is 150/97. Patient does state that she's been having some headaches from time to time and is black dots to come in her eyesight time to time. Patient is also been feeling much more fatigue than usual but denies any type of chest pain any shortness of breath or any weakness in the extremities.  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 Patient declined oh to set a quit date today.   Review of Systems History the history of present illness    Objective:   Physical Exam General: NAD pleasant  Eyes: PERRL, EOMi patient has no taking or changes over the Ears: TM intact b/l non bulging no erythema  Mouth: MMM  Neck: Moderate goiter this is change somewhat from last visit. Lungs: CTAB  Heart: RRR no murmur  Abdomen: BS+, NT ND  Pulses: 2+  Extremities: no edema  Neurologic: CN 2-12 intact    Assessment & Plan:

## 2011-02-28 NOTE — Assessment & Plan Note (Signed)
Patient is complaining of fatigue still. Patient has a new findings or goiter so we will get an thyroid function tests and see if there's anything abnormal it would be easily treatable.

## 2011-02-28 NOTE — Assessment & Plan Note (Addendum)
Symmetric bilaterally no nodules we will get labs to feel that no imaging is necessary at this time. Patient has had this for the last 2 visits and it seems to be moderately bigger than last time. I think once we have the labs we should consider also doing a imaging to make sure there is no nodules.

## 2011-02-28 NOTE — Assessment & Plan Note (Signed)
Still smoking but has decreased to less than 5 cigarettes daily encourage patient to attempt to set a quit date patient was not willing to do that today we will follow that up in 2 weeks.

## 2011-02-28 NOTE — Patient Instructions (Signed)
Good to see you. I am going to get some labs and I will call you when I get the results. I'm starting you on a small water pill I when she to take daily. Really would like to see you again in 2 weeks just to make sure everything is getting better and that you're tolerating the medication okay. Continue to try to stop smoking is the best thing he can do for yourself. See you in 2 weeks.

## 2011-03-12 ENCOUNTER — Ambulatory Visit (INDEPENDENT_AMBULATORY_CARE_PROVIDER_SITE_OTHER): Payer: Self-pay | Admitting: Family Medicine

## 2011-03-12 ENCOUNTER — Encounter: Payer: Self-pay | Admitting: Family Medicine

## 2011-03-12 VITALS — BP 133/94 | HR 76 | Temp 98.4°F | Ht 63.0 in | Wt 214.0 lb

## 2011-03-12 DIAGNOSIS — R220 Localized swelling, mass and lump, head: Secondary | ICD-10-CM

## 2011-03-12 DIAGNOSIS — R221 Localized swelling, mass and lump, neck: Secondary | ICD-10-CM | POA: Insufficient documentation

## 2011-03-12 DIAGNOSIS — I10 Essential (primary) hypertension: Secondary | ICD-10-CM

## 2011-03-12 DIAGNOSIS — E049 Nontoxic goiter, unspecified: Secondary | ICD-10-CM

## 2011-03-12 MED ORDER — HYDROCHLOROTHIAZIDE 12.5 MG PO CAPS: 25.0000 mg | ORAL_CAPSULE | Freq: Every day | ORAL | Status: DC

## 2011-03-12 NOTE — Assessment & Plan Note (Addendum)
Patient's blood pressure is improved Medications we will increase to 25 mg hydrochlorothiazide Followup in 3-4 weeks

## 2011-03-12 NOTE — Assessment & Plan Note (Signed)
Patient's neck swelling likely more of a lymph node which could be midline no a little more concerning. Patient also has a history of smoking. We will get a ultrasound just because we are also getting an ultrasound of her thyroid. Will call patient with results we'll follow up in 3-4 weeks to make sure it improves. It does not improve would consider using antibiotics and seeing if it improves. The patient red flags to look out for such as trouble swallowing hoarseness or worsening pain

## 2011-03-12 NOTE — Progress Notes (Signed)
  Subjective:    Patient ID: Lisa Sullivan, female    DOB: Jul 27, 1965, 45 y.o.   MRN: [id number]  HPI Patient is coming back with followup for her hypertension. Patient on last visit was given hydrochlorothiazide 12.5 mg daily. Patient's blood pressure today is Side effects: Patient denies any chest pain shortness of breath abdominal pain orthostatic hypotension type symptoms dizziness lightheadedness or loss of consciousness.  At last visit patient also had a goiter that seems to be enlarging from previous visits. Patient stated that she was feeling a little fatigued and having trouble with weight loss. Patient's did have thyroid labs drawn.  Lab Results  Component Value Date   TSH 2.205 02/28/2011   at this time we will get a ultrasound due to patient feeling very concerned. Starting last night as well patient had a midline mass under her jaw. Patient states it is tender to palpation but denies any fever, chills, any trouble swallowing or  Hoarseness.   Review of Systems As stated above in history of present illness    Objective:   Physical Exam General: NAD pleasant  Eyes: PERRL, EOMi patient has no taking or changes over the Ears: TM intact b/l non bulging no erythema  Mouth: MMM mild postnasal drip no masses or erythema in the mucosa. Neck: Moderate goiter stable since last visit. At midline on lower jaw on the inferior aspect patient has a 0.5 cm mass with mild edema surrounding it. Mildly tender to palpation mobile but does consistency is hard such as a lymph node more than salivary gland. Lungs: CTAB  Heart: RRR no murmur  Abdomen: BS+, NT ND  Pulses: 2+  Extremities: no edema     Assessment & Plan:

## 2011-03-12 NOTE — Assessment & Plan Note (Signed)
We'll get ultrasound to review. In addition to this patient's midline mass likely a enlarged lymph node we will also ultrasound to make sure it's not a solitary mass of a sore to her with the biopsy. Patient does have a history of smoking

## 2011-03-12 NOTE — Patient Instructions (Signed)
It is good to see you. I when she to increase your blood pressure medicine to 2 pills daily. We will get an ultrasound of your neck and I will call you with the results. I should probably see you again in 3-4 weeks. Happy holidays and happy new year.

## 2011-03-13 ENCOUNTER — Ambulatory Visit (HOSPITAL_COMMUNITY)
Admission: RE | Admit: 2011-03-13 | Discharge: 2011-03-13 | Disposition: A | Discharge status: Home / Self Care | Payer: Self-pay | Source: Ambulatory Visit | Attending: Family Medicine | Admitting: Family Medicine

## 2011-03-13 DIAGNOSIS — E049 Nontoxic goiter, unspecified: Secondary | ICD-10-CM | POA: Insufficient documentation

## 2011-03-13 DIAGNOSIS — E041 Nontoxic single thyroid nodule: Secondary | ICD-10-CM | POA: Insufficient documentation

## 2011-03-14 ENCOUNTER — Other Ambulatory Visit (HOSPITAL_COMMUNITY): Payer: Self-pay

## 2011-04-23 ENCOUNTER — Encounter: Payer: Self-pay | Admitting: Family Medicine

## 2011-04-23 ENCOUNTER — Ambulatory Visit (INDEPENDENT_AMBULATORY_CARE_PROVIDER_SITE_OTHER): Payer: Self-pay | Admitting: Family Medicine

## 2011-04-23 VITALS — BP 131/88 | HR 67 | Temp 98.1°F | Ht 63.0 in | Wt 223.0 lb

## 2011-04-23 DIAGNOSIS — J069 Acute upper respiratory infection, unspecified: Secondary | ICD-10-CM

## 2011-04-23 MED ORDER — FEXOFENADINE HCL 180 MG PO TABS: 180.0000 mg | ORAL_TABLET | Freq: Every day | ORAL | Status: DC

## 2011-04-23 MED ORDER — GUAIFENESIN ER 600 MG PO TB12: 1200.0000 mg | ORAL_TABLET | Freq: Two times a day (BID) | ORAL | Status: DC

## 2011-04-23 NOTE — Progress Notes (Signed)
  Subjective:    Patient ID: Lisa Sullivan, female    DOB: 1965-08-28, 46 y.o.   MRN: [id number]  URI  This is a new problem. The current episode started in the past 7 days. The problem has been gradually worsening. There has been no fever. Associated symptoms include congestion, coughing, headaches and nausea. Pertinent negatives include no abdominal pain, dysuria or vomiting. Associated symptoms comments: Eye swelling. She has tried antihistamine, increased fluids and NSAIDs for the symptoms. The treatment provided mild relief.      Review of Systems  Constitutional: Negative for fever, chills and fatigue.  HENT: Positive for congestion.   Respiratory: Positive for cough. Negative for chest tightness and shortness of breath.   Cardiovascular: Negative for palpitations.  Gastrointestinal: Positive for nausea. Negative for vomiting and abdominal pain.  Genitourinary: Negative for dysuria and genital sores.  Neurological: Positive for headaches.       Objective:   Physical Exam  Vitals reviewed. Constitutional: She appears well-developed and well-nourished.  HENT:  Head: Normocephalic and atraumatic.  Nose: Mucosal edema and rhinorrhea present. Right sinus exhibits no maxillary sinus tenderness. Left sinus exhibits no maxillary sinus tenderness.  Mouth/Throat: Oropharynx is clear and moist.       Right TM with effusion  Eyes: Pupils are equal, round, and reactive to light. Right conjunctiva is injected.  Neck: Normal range of motion. No thyromegaly present.  Cardiovascular: Normal rate, regular rhythm and normal heart sounds.   Pulmonary/Chest: Effort normal and breath sounds normal. She has no wheezes. She has no rales.  Abdominal: Soft. There is no tenderness.  Lymphadenopathy:    She has no cervical adenopathy.          Assessment & Plan:   1. URI (upper respiratory infection)   Mucinex, Allegra, Visine prn

## 2011-04-23 NOTE — Patient Instructions (Signed)

## 2011-05-03 ENCOUNTER — Other Ambulatory Visit: Payer: Self-pay | Admitting: Family Medicine

## 2011-05-03 NOTE — Telephone Encounter (Signed)
Refill request

## 2011-07-08 ENCOUNTER — Telehealth: Payer: Self-pay | Admitting: Family Medicine

## 2011-07-08 DIAGNOSIS — I10 Essential (primary) hypertension: Secondary | ICD-10-CM

## 2011-07-08 MED ORDER — HYDROCHLOROTHIAZIDE 12.5 MG PO CAPS: 25.0000 mg | ORAL_CAPSULE | Freq: Every day | ORAL | Status: DC

## 2011-07-08 NOTE — Telephone Encounter (Signed)
She does need to be seen.

## 2011-07-08 NOTE — Telephone Encounter (Signed)
Voice message left for patient. Need to schedule appointment to follow up about BP.

## 2011-07-08 NOTE — Telephone Encounter (Signed)
Pt is supposed to be taking 2   hydrochlorothiazide (MICROZIDE) 12.5 MG   And the pharmacy only has her taking 1 - needs to get this changed  [facility]

## 2011-07-08 NOTE — Telephone Encounter (Signed)
Message left on voicemail that rx has been sent  and to call  Soon for appointment.

## 2011-07-23 ENCOUNTER — Encounter: Payer: Self-pay | Admitting: Family Medicine

## 2011-07-26 ENCOUNTER — Other Ambulatory Visit (HOSPITAL_COMMUNITY)
Admission: RE | Admit: 2011-07-26 | Discharge: 2011-07-26 | Disposition: A | Discharge status: Home / Self Care | Payer: Self-pay | Source: Ambulatory Visit | Attending: Family Medicine | Admitting: Family Medicine

## 2011-07-26 ENCOUNTER — Encounter: Payer: Self-pay | Admitting: Family Medicine

## 2011-07-26 ENCOUNTER — Ambulatory Visit (INDEPENDENT_AMBULATORY_CARE_PROVIDER_SITE_OTHER): Payer: Self-pay | Admitting: Family Medicine

## 2011-07-26 VITALS — BP 132/100 | HR 79 | Temp 98.3°F | Ht 63.0 in | Wt 219.0 lb

## 2011-07-26 DIAGNOSIS — R7309 Other abnormal glucose: Secondary | ICD-10-CM

## 2011-07-26 DIAGNOSIS — Z01419 Encounter for gynecological examination (general) (routine) without abnormal findings: Secondary | ICD-10-CM | POA: Insufficient documentation

## 2011-07-26 DIAGNOSIS — E669 Obesity, unspecified: Secondary | ICD-10-CM

## 2011-07-26 DIAGNOSIS — R739 Hyperglycemia, unspecified: Secondary | ICD-10-CM

## 2011-07-26 DIAGNOSIS — E049 Nontoxic goiter, unspecified: Secondary | ICD-10-CM

## 2011-07-26 DIAGNOSIS — Z124 Encounter for screening for malignant neoplasm of cervix: Secondary | ICD-10-CM

## 2011-07-26 DIAGNOSIS — Z23 Encounter for immunization: Secondary | ICD-10-CM

## 2011-07-26 DIAGNOSIS — F17200 Nicotine dependence, unspecified, uncomplicated: Secondary | ICD-10-CM

## 2011-07-26 DIAGNOSIS — I10 Essential (primary) hypertension: Secondary | ICD-10-CM

## 2011-07-26 LAB — POCT GLYCOSYLATED HEMOGLOBIN (HGB A1C): Hemoglobin A1C: 6

## 2011-07-26 MED ORDER — LEVOTHYROXINE SODIUM 25 MCG PO TABS: 25.0000 ug | ORAL_TABLET | Freq: Every day | ORAL | Status: DC

## 2011-07-26 MED ORDER — METFORMIN HCL 500 MG PO TABS: 500.0000 mg | ORAL_TABLET | Freq: Two times a day (BID) | ORAL | Status: DC

## 2011-07-26 MED ORDER — RANITIDINE HCL 150 MG PO CAPS: 150.0000 mg | ORAL_CAPSULE | Freq: Two times a day (BID) | ORAL | Status: DC

## 2011-07-26 NOTE — Assessment & Plan Note (Signed)
Patient's goiter we will monitor closely, will start Synthroid at a very low dose see if patient symptoms improved as well as goiter. Patient has had workup no findings to suggest any further workup is necessary.

## 2011-07-26 NOTE — Progress Notes (Signed)
  Subjective:    Patient ID: Lisa Sullivan, female    DOB: 23-Jun-1965, 46 y.o.   MRN: [id number]  Gynecologic Exam   patient is here for her yearly exam.  1. Hypertension Blood pressure at home: Not checking Blood pressure today: 130 systolically on multiple checks and 90s to 100 diastolic Taking Meds: Yes hydrochlorothiazide 25 mg daily. Side effects: No ROS: Denies headache visual changes nausea, vomiting, chest pain or abdominal pain or shortness of breath.  At last visit patient also had a goiter that seems to be enlarging from previous visits. Patient stated that she was feeling a little fatigued and having trouble with weight loss. Patient's did have thyroid labs drawn.  Lab Results  Component Value Date   TSH 2.205 02/28/2011   ultrasound was negative at that time as well. Patient still complains of fatigue as well as weight loss that is not occurring even though patient is trying very hard. 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 Filed Weights   07/26/11 1557  Weight: 219 lb (99.338 kg)   patient has not been working on a regular basis but is encouraged to do so.  Patient is still smoking has never attempted to try to stop it would be very interested in stopping. She knows that this can affect her weight and her high blood pressure.  Genella Rife- patient also has a history of GERD has taken medicine for such as Entex which has improved has been off work for some time and is starting to have increasing heartburn with food as well as laying down at night. Patient denies any weight loss any hematemesis.   Preventative care-patient is due for her Pap smear as well as tetanus shot. Review of Systems  As stated above in history of present illness    Objective:   Physical Exam  vitals reviewed General: NAD pleasant  Eyes: PERRL, EOMi patient has no taking or changes over the Ears: TM intact b/l non bulging no  erythema  Mouth: MMM mild postnasal drip no masses or erythema in the mucosa. Neck: Moderate goiter stable since last visit. Lungs: CTAB  Heart: RRR no murmur  Abdomen: BS+, NT ND  Pulses: 2+  Extremities: Trace edema  Pelvic exam: Patient had Pap collected normal vaginal mucosa patient did have some very mild scant white discharge from the cervical os patient is nontender on bimanual exam no masses palpated.    Assessment & Plan:

## 2011-07-26 NOTE — Assessment & Plan Note (Signed)
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 Patient will follow up with Dr. Raymondo Band for intramedullary blood pressure monitoring. Patient will try some diet and exercise MIBI in follow up with Dr. Verlon Setting and nutrition clinic. Patient will return in 4-6 weeks for reevaluation.

## 2011-07-26 NOTE — Patient Instructions (Signed)
It is good to see you. We are checking your A1c today. I'm starting 2 new medications in you. One is for your thyroid in one is to keep your sugars controlled. Your blood pressure is little elevated today but we'll make no changes at this time. I would like you to come back and see Dr. Raymondo Band for ambulatory blood pressure monitoring and smoking cessation. He should probably come back and see me in 4-6 weeks to see how you're doing.  Marland Kitchen

## 2011-07-26 NOTE — Assessment & Plan Note (Signed)
Will be seen by Dr. Raymondo Band for smoking cessation. Encouraged to quit smoking. Patient has not tried to quit ever so is very open to many different options. Patient would like to quit for her kids as well as her health.

## 2011-07-26 NOTE — Assessment & Plan Note (Signed)
Did get an A1c with patient having a history of hyperglycemia as well as obesity. Patient has a strong family history of diabetes as well. Patient's A1c was 6.0 conserver prediabetic issues. Patient also has a history of gestational diabetes Started metformin 500 mg twice a day  Followup with Dr. Verlon Setting as well as myself in 4-6 weeks.

## 2011-07-29 NOTE — Progress Notes (Signed)
Addended by: Jone Baseman D on: 07/29/2011 08:46 AM   Modules accepted: Orders

## 2011-07-30 ENCOUNTER — Telehealth: Payer: Self-pay | Admitting: *Deleted

## 2011-07-30 NOTE — Telephone Encounter (Signed)
Lisa Sullivan ok to change

## 2011-07-31 MED ORDER — FAMOTIDINE 40 MG PO TABS: 40.0000 mg | ORAL_TABLET | Freq: Two times a day (BID) | ORAL | Status: DC

## 2011-07-31 NOTE — Telephone Encounter (Signed)
Done

## 2011-07-31 NOTE — Telephone Encounter (Signed)
Spoke with Dr. Jennette Kettle and she agreed to pepcid 20mg  bid.Loralee Pacas [place]

## 2011-08-21 ENCOUNTER — Other Ambulatory Visit (HOSPITAL_COMMUNITY): Payer: Self-pay | Admitting: General Practice

## 2011-08-21 ENCOUNTER — Other Ambulatory Visit: Payer: Self-pay | Admitting: Family Medicine

## 2011-08-21 DIAGNOSIS — Z1231 Encounter for screening mammogram for malignant neoplasm of breast: Secondary | ICD-10-CM

## 2011-09-12 ENCOUNTER — Emergency Department (HOSPITAL_COMMUNITY)
Admission: EM | Admit: 2011-09-12 | Discharge: 2011-09-12 | Disposition: A | Discharge status: Home / Self Care | Payer: Self-pay | Attending: Emergency Medicine | Admitting: Emergency Medicine

## 2011-09-12 ENCOUNTER — Encounter (HOSPITAL_COMMUNITY): Payer: Self-pay

## 2011-09-12 DIAGNOSIS — Z882 Allergy status to sulfonamides status: Secondary | ICD-10-CM | POA: Insufficient documentation

## 2011-09-12 DIAGNOSIS — F17200 Nicotine dependence, unspecified, uncomplicated: Secondary | ICD-10-CM | POA: Insufficient documentation

## 2011-09-12 DIAGNOSIS — I10 Essential (primary) hypertension: Secondary | ICD-10-CM | POA: Insufficient documentation

## 2011-09-12 DIAGNOSIS — M542 Cervicalgia: Secondary | ICD-10-CM | POA: Insufficient documentation

## 2011-09-12 DIAGNOSIS — M62838 Other muscle spasm: Secondary | ICD-10-CM | POA: Insufficient documentation

## 2011-09-12 HISTORY — DX: Essential (primary) hypertension: I10

## 2011-09-12 LAB — POCT PREGNANCY, URINE: Preg Test, Ur: NEGATIVE

## 2011-09-12 MED ORDER — IBUPROFEN 800 MG PO TABS: 800.0000 mg | ORAL_TABLET | Freq: Three times a day (TID) | ORAL | Status: AC | PRN

## 2011-09-12 MED ORDER — DIAZEPAM 5 MG PO TABS
5.0000 mg | ORAL_TABLET | Freq: Once | ORAL | Status: AC
  Administered 2011-09-12: 5 mg via ORAL
  Filled 2011-09-12: qty 1

## 2011-09-12 MED ORDER — ONDANSETRON 4 MG PO TBDP
ORAL_TABLET | ORAL | Status: AC
  Administered 2011-09-12: 4 mg
  Filled 2011-09-12: qty 1

## 2011-09-12 MED ORDER — HYDROMORPHONE HCL PF 1 MG/ML IJ SOLN
1.0000 mg | Freq: Once | INTRAMUSCULAR | Status: AC
  Administered 2011-09-12: 1 mg via INTRAMUSCULAR
  Filled 2011-09-12: qty 1

## 2011-09-12 MED ORDER — DIAZEPAM 5 MG PO TABS: 5.0000 mg | ORAL_TABLET | Freq: Three times a day (TID) | ORAL | Status: AC | PRN

## 2011-09-12 MED ORDER — KETOROLAC TROMETHAMINE 60 MG/2ML IM SOLN
60.0000 mg | Freq: Once | INTRAMUSCULAR | Status: AC
  Administered 2011-09-12: 60 mg via INTRAMUSCULAR
  Filled 2011-09-12: qty 2

## 2011-09-12 MED ORDER — OXYCODONE-ACETAMINOPHEN 5-325 MG PO TABS: 1.0000 | ORAL_TABLET | Freq: Four times a day (QID) | ORAL | Status: AC | PRN

## 2011-09-12 NOTE — ED Provider Notes (Signed)
History     CSN: [id number]  Arrival date & time 09/12/11  1478   First MD Initiated Contact with Patient 09/12/11 [phone]      Chief Complaint  Patient presents with  . Neck Pain    (Consider location/radiation/quality/duration/timing/severity/associated sxs/prior treatment) HPI Pt reports about 2 weeks of severe, aching R shoulder pain after doing some heavy lifting. Denies any falls or trauma. Pain radiates to her R lateral arm, worse with movement. No numbness or tingling. Not improved with motrin taken at home.   Past Medical History  Diagnosis Date  . Hypertension     Past Surgical History  Procedure Date  . Cesarean section     History reviewed. No pertinent family history.  History  Substance Use Topics  . Smoking status: Current Everyday Smoker -- 0.5 packs/day  . Smokeless tobacco: Not on file  . Alcohol Use: Yes    OB History    Grav Para Term Preterm Abortions TAB SAB Ect Mult Living                  Review of Systems All other systems reviewed and are negative except as noted in HPI.   Allergies  Sulfa antibiotics  Home Medications   Current Outpatient Rx  Name Route Sig Dispense Refill  . VITAMIN B 12 PO Oral Take 1 tablet by mouth daily.    Marland Kitchen FAMOTIDINE 40 MG PO TABS Oral Take 1 tablet (40 mg total) by mouth 2 (two) times daily. 180 tablet 1  . FEXOFENADINE HCL 180 MG PO TABS Oral Take 1 tablet (180 mg total) by mouth daily. 15 tablet 2  . HYDROCHLOROTHIAZIDE 12.5 MG PO CAPS Oral Take 2 capsules (25 mg total) by mouth daily. 180 capsule 1  . IBUPROFEN 200 MG PO TABS Oral Take 800 mg by mouth every 6 (six) hours as needed. Pain     . LEVOTHYROXINE SODIUM 25 MCG PO TABS Oral Take 1 tablet (25 mcg total) by mouth daily. 90 tablet 1  . METFORMIN HCL 500 MG PO TABS Oral Take 1 tablet (500 mg total) by mouth 2 (two) times daily with a meal. 180 tablet 3  . TETRAHYDROZOLINE-ZN SULFATE 0.05-0.25 % OP SOLN Both Eyes Place 2 drops into both eyes 3 (three)  times daily as needed. Dry Eyes       BP 133/86  Pulse 78  Temp 97.9 F (36.6 C) (Oral)  Resp 22  Ht 5\' 3"  (1.6 m)  Wt 215 lb (97.523 kg)  BMI 38.09 kg/m2  SpO2 100%  LMP 07/15/2011  Physical Exam  Nursing note and vitals reviewed. Constitutional: She is oriented to person, place, and time. She appears well-developed and well-nourished.  HENT:  Head: Normocephalic and atraumatic.  Eyes: EOM are normal. Pupils are equal, round, and reactive to light.  Neck: Normal range of motion. Neck supple.  Cardiovascular: Normal rate, normal heart sounds and intact distal pulses.   Pulmonary/Chest: Effort normal and breath sounds normal.  Abdominal: Bowel sounds are normal. She exhibits no distension. There is no tenderness.  Musculoskeletal: Normal range of motion. She exhibits tenderness (tender over the R trapezius muscles). She exhibits no edema.  Neurological: She is alert and oriented to person, place, and time. She has normal strength. No cranial nerve deficit or sensory deficit.  Skin: Skin is warm and dry. No rash noted.  Psychiatric: She has a normal mood and affect.    ED Course  Procedures (including critical care time)  Labs  Reviewed - No data to display No results found.   No diagnosis found.    MDM  Pt feeling better after meds. Family at bedside to take her home. Advised to avoid heavy lifting or strenuous activity and that it may be several days for her pain and spasm to resolve. Advised PCP followup.         Jaymes Revels B. Bernette Mayers, MD 09/12/11 1044

## 2011-09-12 NOTE — ED Notes (Signed)
Pt ambulatory to bathroom to void, specimen obtained if needed.  Upon return, pt reports nausea and requests imaging of her neck.

## 2011-09-12 NOTE — ED Notes (Signed)
Pt presents with 2 week h/o neck pain that radiates into R shoulder and upper arm.  Pt reports lifting objects 2 weeks ago, but denies any specific injury.  Pt reports pain worsened this morning, waking her up.  Pt reports R ear pain as well.

## 2011-09-12 NOTE — ED Notes (Signed)
Pt reports no relief from pain, pt continues to cry.

## 2011-09-13 ENCOUNTER — Ambulatory Visit (HOSPITAL_COMMUNITY): Payer: Self-pay

## 2011-10-08 ENCOUNTER — Ambulatory Visit (HOSPITAL_COMMUNITY): Payer: Self-pay

## 2011-10-09 ENCOUNTER — Ambulatory Visit (HOSPITAL_COMMUNITY)
Admission: RE | Admit: 2011-10-09 | Discharge: 2011-10-09 | Disposition: A | Discharge status: Home / Self Care | Payer: Self-pay | Source: Ambulatory Visit | Attending: Family Medicine | Admitting: Family Medicine

## 2011-10-09 DIAGNOSIS — Z1231 Encounter for screening mammogram for malignant neoplasm of breast: Secondary | ICD-10-CM | POA: Insufficient documentation

## 2011-10-11 ENCOUNTER — Telehealth: Payer: Self-pay | Admitting: *Deleted

## 2011-10-11 NOTE — Telephone Encounter (Signed)
Called [facility], they state they don't call patient on Fridays because they don't wont them to worry over the weekend. They state they will place a call to patient on Monday for follow up ultrasound. FYI to PCP.

## 2011-10-11 NOTE — Telephone Encounter (Signed)
Message copied by Aram Beecham on Fri Oct 11, 2011  4:58 PM ------      Message from: Birdie Sons, ERIC G      Created: Fri Oct 11, 2011 11:21 AM      Regarding: Diagnostic Mammogram for patient below       Thanks.      ----- Message -----         From: Carney Living, MD         Sent: 10/10/2011  11:34 AM           To: Glori Luis, MD                        ----- Message -----         From: Rad Results In Interface         Sent: 10/10/2011   9:15 AM           To: Carney Living, MD

## 2011-10-15 ENCOUNTER — Other Ambulatory Visit: Payer: Self-pay | Admitting: Family Medicine

## 2011-10-15 DIAGNOSIS — R928 Other abnormal and inconclusive findings on diagnostic imaging of breast: Secondary | ICD-10-CM

## 2011-10-18 ENCOUNTER — Telehealth: Payer: Self-pay | Admitting: Family Medicine

## 2011-10-18 NOTE — Telephone Encounter (Signed)
Attempted to call patient at home and on cell phone.  No answer at either number.  Regarding recent mammogram results.  Wanted to check and see if she was contacted earlier this week by the breast imaging center to schedule a follow-up US and diagnostic mammogram.  Will attempt to call patient back later today.

## 2011-10-21 ENCOUNTER — Ambulatory Visit
Admission: RE | Admit: 2011-10-21 | Discharge: 2011-10-21 | Disposition: A | Discharge status: Home / Self Care | Payer: No Typology Code available for payment source | Source: Ambulatory Visit | Attending: Family Medicine | Admitting: Family Medicine

## 2011-10-21 DIAGNOSIS — R928 Other abnormal and inconclusive findings on diagnostic imaging of breast: Secondary | ICD-10-CM

## 2011-10-31 ENCOUNTER — Telehealth: Payer: Self-pay | Admitting: Family Medicine

## 2011-10-31 NOTE — Telephone Encounter (Signed)
Called patient to report negative follow-up imaging for breast lesion.  Patient was not home and left message asking patient to call the clinic.

## 2011-11-20 ENCOUNTER — Ambulatory Visit: Payer: Self-pay | Admitting: Family Medicine

## 2011-11-26 ENCOUNTER — Ambulatory Visit: Payer: Self-pay | Admitting: Family Medicine

## 2012-07-04 ENCOUNTER — Other Ambulatory Visit: Payer: Self-pay | Admitting: Family Medicine

## 2012-07-07 NOTE — Telephone Encounter (Signed)
Pt states that she is completely out of her meds -

## 2012-08-03 ENCOUNTER — Encounter: Payer: Self-pay | Admitting: Family Medicine

## 2012-08-03 ENCOUNTER — Ambulatory Visit (INDEPENDENT_AMBULATORY_CARE_PROVIDER_SITE_OTHER): Payer: BC Managed Care – PPO | Admitting: Family Medicine

## 2012-08-03 VITALS — BP 140/90 | HR 68 | Temp 98.1°F | Ht 63.0 in | Wt 208.0 lb

## 2012-08-03 DIAGNOSIS — R109 Unspecified abdominal pain: Secondary | ICD-10-CM

## 2012-08-03 DIAGNOSIS — M549 Dorsalgia, unspecified: Secondary | ICD-10-CM

## 2012-08-03 LAB — POCT URINALYSIS DIPSTICK
Bilirubin, UA: NEGATIVE
Glucose, UA: NEGATIVE
Nitrite, UA: NEGATIVE
Spec Grav, UA: 1.025
Urobilinogen, UA: 0.2
pH, UA: 5.5

## 2012-08-03 LAB — POCT UA - MICROSCOPIC ONLY

## 2012-08-03 MED ORDER — CYCLOBENZAPRINE HCL 5 MG PO TABS: 10.0000 mg | ORAL_TABLET | Freq: Three times a day (TID) | ORAL | Status: DC | PRN

## 2012-08-03 MED ORDER — TRAMADOL HCL 50 MG PO TABS: 50.0000 mg | ORAL_TABLET | Freq: Three times a day (TID) | ORAL | Status: DC | PRN

## 2012-08-03 NOTE — Patient Instructions (Addendum)
Follow-up in a few days with me. You may cancel appointment if things are better.   Take muscle relaxant (Flexeril) as needed for back pain Take Tramadol as needed for pain   Drink plenty of water to prevent constipation   If your lab results are normal, I will send you a letter with the results. If abnormal, someone at the clinic will get in touch with you.

## 2012-08-03 NOTE — Progress Notes (Signed)
  Subjective:    Patient ID: Lisa Sullivan, female    DOB: [birth year], 47 y.o.   MRN: [id number]  HPI # SDA: back and stomach pain  Back pain for 1 week without radiation He felt like he had a bug or stomach virus on Saturday 05/10. She also had watery stools and felt nauseous and threw-up.   Both hurt equally at this time.   She had a black stool this morning. She no longer has diarrhea.   Denies eating new or unusual foods, including picnic foods; she has been eating at home.   She is able to keep down liquids (water and ginger ale). The last time she had some Oodles of Noodles which she was able to keep down.   No other sick contacts.  She lives with her son.   Medications tried:  -Tylenol ES: 2 tablets did not help   She started her period on Saturday as well   Review of Systems Denies fevers, urinary retention/incontinence Endorses chills  Allergies, medication, past medical history reviewed.  Smoking status noted. Tobacco 2-3 cigarettes daily Denies recent alcohol use  She is sexually active with men; last intercourse 1 month ago    Objective:   Physical Exam GEN: appears uncomfortable; overweight CV: RRR PULM: NI WOB; CTAB without w/r/r ABD: moderate diffuse tenderness worse lower quadrant BACK: moderate diffuse lower lumbar tenderness; no midline tenderness NEURO: negative SLR, sensation intact lower extremities, normal gait GU: menstruating Rectum: no hemorrhoids; normal sphincter tone     Assessment & Plan:

## 2012-08-04 ENCOUNTER — Encounter: Payer: Self-pay | Admitting: Family Medicine

## 2012-08-04 DIAGNOSIS — R109 Unspecified abdominal pain: Secondary | ICD-10-CM | POA: Insufficient documentation

## 2012-08-04 LAB — CBC WITH DIFFERENTIAL/PLATELET
Basophils Relative: 0 % (ref 0–1)
Eosinophils Absolute: 0.1 10*3/uL (ref 0.0–0.7)
Eosinophils Relative: 1 % (ref 0–5)
Hemoglobin: 13 g/dL (ref 12.0–15.0)
Lymphs Abs: 3.1 10*3/uL (ref 0.7–4.0)
MCH: 27.8 pg (ref 26.0–34.0)
MCHC: 32.6 g/dL (ref 30.0–36.0)
MCV: 85.4 fL (ref 78.0–100.0)
Monocytes Relative: 7 % (ref 3–12)
Neutrophils Relative %: 48 % (ref 43–77)
RBC: 4.67 MIL/uL (ref 3.87–5.11)

## 2012-08-04 LAB — COMPREHENSIVE METABOLIC PANEL
Alkaline Phosphatase: 53 U/L (ref 39–117)
BUN: 14 mg/dL (ref 6–23)
CO2: 26 mEq/L (ref 19–32)
Creat: 0.77 mg/dL (ref 0.50–1.10)
Glucose, Bld: 82 mg/dL (ref 70–99)
Total Bilirubin: 0.4 mg/dL (ref 0.3–1.2)

## 2012-08-04 NOTE — Assessment & Plan Note (Addendum)
Suspect gastroenteritis Other D/Dx: UTI, PID -Her diarrhea is resolved and she is able to keep down liquids however she remains uncomfortable -Stop NSAID, start Tramadol -Try Flexeril for back pain  -Check UA>>>negative -Check GC/Chlamydia>>>poor specimen due to her menstruating.  -Check WBC, electrolytes>>>WNL  Called patient and LVM notified of normal labs.  -Follow-up in a few days; may cancel appointment if improved -Work not given to be off next 2 days

## 2012-08-13 ENCOUNTER — Ambulatory Visit: Payer: Self-pay

## 2012-10-20 ENCOUNTER — Other Ambulatory Visit: Payer: Self-pay | Admitting: Family Medicine

## 2012-10-20 DIAGNOSIS — Z1231 Encounter for screening mammogram for malignant neoplasm of breast: Secondary | ICD-10-CM

## 2012-10-26 ENCOUNTER — Ambulatory Visit (HOSPITAL_COMMUNITY): Payer: BC Managed Care – PPO

## 2012-11-05 ENCOUNTER — Ambulatory Visit (HOSPITAL_COMMUNITY): Payer: BC Managed Care – PPO

## 2012-11-20 ENCOUNTER — Ambulatory Visit (HOSPITAL_COMMUNITY)
Admission: RE | Admit: 2012-11-20 | Discharge: 2012-11-20 | Disposition: A | Discharge status: Home / Self Care | Payer: BC Managed Care – PPO | Source: Ambulatory Visit | Attending: Family Medicine | Admitting: Family Medicine

## 2012-11-20 DIAGNOSIS — Z1231 Encounter for screening mammogram for malignant neoplasm of breast: Secondary | ICD-10-CM | POA: Insufficient documentation

## 2012-12-03 ENCOUNTER — Encounter: Payer: Self-pay | Admitting: Family Medicine

## 2012-12-03 ENCOUNTER — Ambulatory Visit (INDEPENDENT_AMBULATORY_CARE_PROVIDER_SITE_OTHER): Payer: BC Managed Care – PPO | Admitting: Family Medicine

## 2012-12-03 ENCOUNTER — Ambulatory Visit
Admission: RE | Admit: 2012-12-03 | Discharge: 2012-12-03 | Disposition: A | Discharge status: Home / Self Care | Payer: BC Managed Care – PPO | Source: Ambulatory Visit | Attending: Family Medicine | Admitting: Family Medicine

## 2012-12-03 VITALS — BP 146/81 | HR 60 | Temp 98.3°F | Ht 63.0 in | Wt 218.0 lb

## 2012-12-03 DIAGNOSIS — J069 Acute upper respiratory infection, unspecified: Secondary | ICD-10-CM

## 2012-12-03 MED ORDER — ONDANSETRON HCL 4 MG PO TABS: 4.0000 mg | ORAL_TABLET | Freq: Three times a day (TID) | ORAL | Status: DC | PRN

## 2012-12-03 MED ORDER — HYDROCHLOROTHIAZIDE 12.5 MG PO CAPS: 25.0000 mg | ORAL_CAPSULE | ORAL | Status: DC

## 2012-12-03 MED ORDER — BENZONATATE 100 MG PO CAPS: 100.0000 mg | ORAL_CAPSULE | Freq: Three times a day (TID) | ORAL | Status: DC | PRN

## 2012-12-03 NOTE — Patient Instructions (Addendum)
I would like to get a chest xray to make sure that there is nothing going on in your chest.  Your symptoms are very consistent with a viral infection. We treat viruses symptomatically. This is what we can do:  - ibuprofen 600mg  every 6hrs for fevers and body pain - I am sending a prescription to help with the cough - I am also sending a prescription to help with nausea and vomiting in case it happens again.  - you can take sudafed for the congestion. This you can get over the counter.    Upper Respiratory Infection, Adult An upper respiratory infection (URI) is also sometimes known as the common cold. The upper respiratory tract includes the nose, sinuses, throat, trachea, and bronchi. Bronchi are the airways leading to the lungs. Most people improve within 1 week, but symptoms can last up to 2 weeks. A residual cough may last even longer.  CAUSES Many different viruses can infect the tissues lining the upper respiratory tract. The tissues become irritated and inflamed and often become very moist. Mucus production is also common. A cold is contagious. You can easily spread the virus to others by oral contact. This includes kissing, sharing a glass, coughing, or sneezing. Touching your mouth or nose and then touching a surface, which is then touched by another person, can also spread the virus. SYMPTOMS  Symptoms typically develop 1 to 3 days after you come in contact with a cold virus. Symptoms vary from person to person. They may include:  Runny nose.  Sneezing.  Nasal congestion.  Sinus irritation.  Sore throat.  Loss of voice (laryngitis).  Cough.  Fatigue.  Muscle aches.  Loss of appetite.  Headache.  Low-grade fever. DIAGNOSIS  You might diagnose your own cold based on familiar symptoms, since most people get a cold 2 to 3 times a year. Your caregiver can confirm this based on your exam. Most importantly, your caregiver can check that your symptoms are not due to another  disease such as strep throat, sinusitis, pneumonia, asthma, or epiglottitis. Blood tests, throat tests, and X-rays are not necessary to diagnose a common cold, but they may sometimes be helpful in excluding other more serious diseases. Your caregiver will decide if any further tests are required. RISKS AND COMPLICATIONS  You may be at risk for a more severe case of the common cold if you smoke cigarettes, have chronic heart disease (such as heart failure) or lung disease (such as asthma), or if you have a weakened immune system. The very young and very old are also at risk for more serious infections. Bacterial sinusitis, middle ear infections, and bacterial pneumonia can complicate the common cold. The common cold can worsen asthma and chronic obstructive pulmonary disease (COPD). Sometimes, these complications can require emergency medical care and may be life-threatening. PREVENTION  The best way to protect against getting a cold is to practice good hygiene. Avoid oral or hand contact with people with cold symptoms. Wash your hands often if contact occurs. There is no clear evidence that vitamin C, vitamin E, echinacea, or exercise reduces the chance of developing a cold. However, it is always recommended to get plenty of rest and practice good nutrition. TREATMENT  Treatment is directed at relieving symptoms. There is no cure. Antibiotics are not effective, because the infection is caused by a virus, not by bacteria. Treatment may include:  Increased fluid intake. Sports drinks offer valuable electrolytes, sugars, and fluids.  Breathing heated mist or steam (vaporizer  or shower).  Eating chicken soup or other clear broths, and maintaining good nutrition.  Getting plenty of rest.  Using gargles or lozenges for comfort.  Controlling fevers with ibuprofen or acetaminophen as directed by your caregiver.  Increasing usage of your inhaler if you have asthma. Zinc gel and zinc lozenges, taken in  the first 24 hours of the common cold, can shorten the duration and lessen the severity of symptoms. Pain medicines may help with fever, muscle aches, and throat pain. A variety of non-prescription medicines are available to treat congestion and runny nose. Your caregiver can make recommendations and may suggest nasal or lung inhalers for other symptoms.  HOME CARE INSTRUCTIONS   Only take over-the-counter or prescription medicines for pain, discomfort, or fever as directed by your caregiver.  Use a warm mist humidifier or inhale steam from a shower to increase air moisture. This may keep secretions moist and make it easier to breathe.  Drink enough water and fluids to keep your urine clear or pale yellow.  Rest as needed.  Return to work when your temperature has returned to normal or as your caregiver advises. You may need to stay home longer to avoid infecting others. You can also use a face mask and careful hand washing to prevent spread of the virus. SEEK MEDICAL CARE IF:   After the first few days, you feel you are getting worse rather than better.  You need your caregiver's advice about medicines to control symptoms.  You develop chills, worsening shortness of breath, or brown or red sputum. These may be signs of pneumonia.  You develop yellow or brown nasal discharge or pain in the face, especially when you bend forward. These may be signs of sinusitis.  You develop a fever, swollen neck glands, pain with swallowing, or white areas in the back of your throat. These may be signs of strep throat. SEEK IMMEDIATE MEDICAL CARE IF:   You have a fever.  You develop severe or persistent headache, ear pain, sinus pain, or chest pain.  You develop wheezing, a prolonged cough, cough up blood, or have a change in your usual mucus (if you have chronic lung disease).  You develop sore muscles or a stiff neck. Document Released: 09/04/2000 Document Revised: 06/03/2011 Document Reviewed:  07/13/2010 [facility] Patient Information 2014 [place], [place].

## 2012-12-03 NOTE — Assessment & Plan Note (Addendum)
Symptoms likely from viral URI. - check CXR to rule out pneumonia - treat aches and chills with ibuprofen scheduled for 2 days - sudafed for congestion - tessalon for cough - zofran for nausea - return to clinic if worst or not improved.

## 2012-12-03 NOTE — Progress Notes (Signed)
Patient ID: Lisa Sullivan    DOB: [birth year], 47 y.o.   MRN: [id number] --- Subjective:  Lisa Sullivan is a 47 y.o.female who presents to same day clinic with cough, sore throat and body aches.   - started with nasal congestion on Monday, then started having sore throat and cough, followed by body aches. Cough is non productive. No difficulty breathing but some wheezing at times. She has been having headache as well as nausea and 2 episodes of vomiting yesterday. Despite this, she has been able to keep fluids down. She has had fevers at night of 101.0. Overall symptoms have worsened since starting.   ROS: see HPI Past Medical History: reviewed and updated medications and allergies. Social History: Tobacco: 1/2 pack per day  Objective: Filed Vitals:   12/03/12 1112  BP: 146/81  Pulse: 60  Temp: 98.3 F (36.8 C)   O2: 100% on room air  Physical Examination:   General appearance - alert, tired appearing, in no distress Ears - bilateral TM's and external ear canals normal Nose - congested and erythematous nasal turbinates bilaterally Mouth - mucous membranes moist, pharynx normal without lesions Neck - supple, tender left cervical lymph node.  Chest -normal work of breathing, occasional wheeze, symmetric air entry Heart - normal rate, regular rhythm, normal S1, S2, no murmurs Abdomen - soft, nontender, nondistended

## 2012-12-04 ENCOUNTER — Telehealth: Payer: Self-pay | Admitting: Family Medicine

## 2012-12-04 MED ORDER — AZITHROMYCIN 250 MG PO TABS: ORAL_TABLET | ORAL | Status: DC

## 2012-12-04 NOTE — Telephone Encounter (Signed)
Called patient to let her know of teh cxr results showing possible bronchitis. Patient is feeling worst, now with a fever and coughing more. Since symptoms have been lasting for 5 days and worsening, will send azithromycin prescription to pharmacy.  Patient expressed understanding and agreed with plan.   Marena Chancy, PGY-3 Family Medicine Resident

## 2013-04-13 ENCOUNTER — Telehealth: Payer: Self-pay | Admitting: Family Medicine

## 2013-04-13 MED ORDER — HYDROCHLOROTHIAZIDE 12.5 MG PO CAPS: 25.0000 mg | ORAL_CAPSULE | ORAL | Status: DC

## 2013-04-13 NOTE — Telephone Encounter (Signed)
Will run out of blood pressure pills in 2-3 day Cant wait for the appt on Feb 9 Please advise

## 2013-04-13 NOTE — Telephone Encounter (Signed)
Covering for Dr. Caryl Bis. Will write Rx for HCTZ for 30 days. He will need to get further refills once he comes in on Feb 9. Thanks! --CMS

## 2013-04-14 NOTE — Telephone Encounter (Signed)
LM for pt informing him that rx was sent to pharmacy. Jazmin Hartsell,CMA

## 2013-06-10 ENCOUNTER — Ambulatory Visit (INDEPENDENT_AMBULATORY_CARE_PROVIDER_SITE_OTHER): Payer: No Typology Code available for payment source | Admitting: Family Medicine

## 2013-06-10 ENCOUNTER — Encounter: Payer: Self-pay | Admitting: Family Medicine

## 2013-06-10 VITALS — BP 131/88 | HR 72 | Ht 63.0 in | Wt 222.1 lb

## 2013-06-10 DIAGNOSIS — I10 Essential (primary) hypertension: Secondary | ICD-10-CM

## 2013-06-10 DIAGNOSIS — K5900 Constipation, unspecified: Secondary | ICD-10-CM | POA: Insufficient documentation

## 2013-06-10 DIAGNOSIS — L301 Dyshidrosis [pompholyx]: Secondary | ICD-10-CM

## 2013-06-10 LAB — BASIC METABOLIC PANEL
BUN: 15 mg/dL (ref 6–23)
CALCIUM: 9.3 mg/dL (ref 8.4–10.5)
CO2: 25 mEq/L (ref 19–32)
Chloride: 105 mEq/L (ref 96–112)
Creat: 0.84 mg/dL (ref 0.50–1.10)
Glucose, Bld: 95 mg/dL (ref 70–99)
POTASSIUM: 4.5 meq/L (ref 3.5–5.3)
SODIUM: 137 meq/L (ref 135–145)

## 2013-06-10 LAB — LIPID PANEL
CHOL/HDL RATIO: 3.7 ratio
Cholesterol: 212 mg/dL — ABNORMAL HIGH (ref 0–200)
HDL: 58 mg/dL (ref >39)
LDL Cholesterol: 129 mg/dL — ABNORMAL HIGH (ref 0–99)
Triglycerides: 123 mg/dL (ref <150)
VLDL: 25 mg/dL (ref 0–40)

## 2013-06-10 MED ORDER — CLOBETASOL PROPIONATE 0.05 % EX OINT: 1.0000 "application " | TOPICAL_OINTMENT | Freq: Two times a day (BID) | CUTANEOUS | Status: DC

## 2013-06-10 MED ORDER — HYDROCHLOROTHIAZIDE 12.5 MG PO CAPS: 25.0000 mg | ORAL_CAPSULE | ORAL | Status: DC

## 2013-06-10 MED ORDER — POLYETHYLENE GLYCOL 3350 17 GM/SCOOP PO POWD: 17.0000 g | Freq: Every day | ORAL | Status: DC | PRN

## 2013-06-10 MED ORDER — CETIRIZINE HCL 10 MG PO TABS: 10.0000 mg | ORAL_TABLET | Freq: Every day | ORAL | Status: DC

## 2013-06-10 NOTE — Assessment & Plan Note (Signed)
Potentially related to benadryl use. Will give trial of miralax. Needs more fiber in diet. F/u in one month for this issue.

## 2013-06-10 NOTE — Progress Notes (Signed)
Patient ID: Lisa Sullivan, female   DOB: November 26, 1965, 48 y.o.   MRN: [id number]  Tommi Rumps, MD Phone: [phone]  Lisa Sullivan is a 48 y.o. female who presents today for HTN, hand rash, and constipation.  HYPERTENSION Disease Monitoring Home BP Monitoring not checking Chest pain- no    Dyspnea- no Medications Compliance-  Taking, though has been out for the past week. Edema- no  Left hand rash: has been present for years. Saw derm previously and they told her they did not know what it was and there wasn't anything in particular to do for this, per her report. The area itches. Burns when she gets it wet and aches. She has tried triamcinolone on it without benefit. She takes daily benadryl 25 mg for itching.   Constipation: for the past month. She notes having to strain and has small balls of stool. Feels a fullness in her lower stomach. Has not noted any bleeding. She notes not having a lot of fiber in her diet. She takes benadryl nightly.  Patient is a smoker.   ROS: Per HPI   Physical Exam Filed Vitals:   06/10/13 1042  BP: 131/88  Pulse: 72    Physical Examination: General appearance - alert, well appearing, and in no distress Chest - clear to auscultation, no wheezes, rales or rhonchi, symmetric air entry Heart - normal rate, regular rhythm, normal S1, S2, no murmurs, rubs, clicks or gallops Abdomen - soft, mild tenderness in left lower quadrant and suprapubic region, no guarding or rebound, +BS Extremities - no pedal edema noted Skin - left hand with palmar rash consisting of papules and hypertrophic underlying skin with excoriations   Assessment/Plan: Please see individual problem list.

## 2013-06-10 NOTE — Assessment & Plan Note (Addendum)
Appears to be eczema on hands. Will try clobetasol ointment on the lesion. Discussed using aveeno moisturizer as well. Advised against benadryl use and will prescribe zyrtec for itching. F/u in one month for this issue.

## 2013-06-10 NOTE — Assessment & Plan Note (Signed)
At goal. Will refill HCTZ. Check BMET and lipid panel today. F/u 3 months.

## 2013-06-10 NOTE — Patient Instructions (Signed)
Nice to meet you. Please try the zyrtec and clobetasol ointment for the rash on your hand. You can also try aveeno lotion to help moisturize your skin. Use the miralax for your constipation. You can use this daily until having normal bowel movements.

## 2013-06-12 ENCOUNTER — Telehealth: Payer: Self-pay | Admitting: *Deleted

## 2013-06-12 NOTE — Telephone Encounter (Signed)
Pt is aware of results.  She would like to start a statin.  Pharmacy verified. Jazmin Hartsell,CMA

## 2013-06-12 NOTE — Telephone Encounter (Signed)
Message copied by Valerie Roys on Sat Jun 12, 2013 11:08 AM ------      Message from: Caryl Bis, ERIC G      Created: Fri Jun 11, 2013  9:14 AM       Patient with mildly elevated cholesterol. Given her hypertension, cholesterol, and smoking history the patient would benefit from a statin as these things put her at an increased risk for cardiovascular disease and morbidity. I can send this in if the patient desires this. Please inform the patient of this. ------

## 2013-06-14 MED ORDER — ATORVASTATIN CALCIUM 10 MG PO TABS: 10.0000 mg | ORAL_TABLET | Freq: Every day | ORAL | Status: DC

## 2013-06-14 NOTE — Telephone Encounter (Signed)
Called patient to let her know that medication was called in.  She has now decided that she will try to watch her diet and exercise.  Would like to have it rechecked at a later date to see if she can control on her own first. [facility]

## 2013-06-14 NOTE — Telephone Encounter (Signed)
Lipitor sent in to patients pharmacy. She is to take this once a day. Please inform the patient. Thanks.

## 2013-06-14 NOTE — Telephone Encounter (Signed)
Ok. Will continue to monitor.

## 2013-08-02 ENCOUNTER — Emergency Department (HOSPITAL_COMMUNITY)
Admission: EM | Admit: 2013-08-02 | Discharge: 2013-08-03 | Disposition: A | Discharge status: Home / Self Care | Payer: No Typology Code available for payment source | Attending: Emergency Medicine | Admitting: Emergency Medicine

## 2013-08-02 ENCOUNTER — Emergency Department (HOSPITAL_COMMUNITY): Payer: No Typology Code available for payment source

## 2013-08-02 ENCOUNTER — Encounter (HOSPITAL_COMMUNITY): Payer: Self-pay | Admitting: Emergency Medicine

## 2013-08-02 DIAGNOSIS — Z3202 Encounter for pregnancy test, result negative: Secondary | ICD-10-CM | POA: Insufficient documentation

## 2013-08-02 DIAGNOSIS — F17200 Nicotine dependence, unspecified, uncomplicated: Secondary | ICD-10-CM | POA: Insufficient documentation

## 2013-08-02 DIAGNOSIS — K8020 Calculus of gallbladder without cholecystitis without obstruction: Secondary | ICD-10-CM | POA: Insufficient documentation

## 2013-08-02 DIAGNOSIS — Z79899 Other long term (current) drug therapy: Secondary | ICD-10-CM | POA: Insufficient documentation

## 2013-08-02 DIAGNOSIS — I10 Essential (primary) hypertension: Secondary | ICD-10-CM | POA: Insufficient documentation

## 2013-08-02 LAB — COMPREHENSIVE METABOLIC PANEL
ALK PHOS: 71 U/L (ref 39–117)
ALT: 11 U/L (ref 0–35)
AST: 14 U/L (ref 0–37)
Albumin: 4.1 g/dL (ref 3.5–5.2)
BILIRUBIN TOTAL: 0.2 mg/dL — AB (ref 0.3–1.2)
BUN: 20 mg/dL (ref 6–23)
CHLORIDE: 101 meq/L (ref 96–112)
CO2: 19 meq/L (ref 19–32)
Calcium: 9.5 mg/dL (ref 8.4–10.5)
Creatinine, Ser: 0.94 mg/dL (ref 0.50–1.10)
GFR, EST AFRICAN AMERICAN: 81 mL/min — AB (ref >90)
GFR, EST NON AFRICAN AMERICAN: 70 mL/min — AB (ref >90)
GLUCOSE: 97 mg/dL (ref 70–99)
POTASSIUM: 4.2 meq/L (ref 3.7–5.3)
SODIUM: 139 meq/L (ref 137–147)
Total Protein: 8 g/dL (ref 6.0–8.3)

## 2013-08-02 LAB — CBC WITH DIFFERENTIAL/PLATELET
Basophils Absolute: 0 10*3/uL (ref 0.0–0.1)
Basophils Relative: 0 % (ref 0–1)
EOS PCT: 1 % (ref 0–5)
Eosinophils Absolute: 0.1 10*3/uL (ref 0.0–0.7)
HEMATOCRIT: 42.7 % (ref 36.0–46.0)
Hemoglobin: 13.6 g/dL (ref 12.0–15.0)
LYMPHS ABS: 3 10*3/uL (ref 0.7–4.0)
LYMPHS PCT: 42 % (ref 12–46)
MCH: 27.1 pg (ref 26.0–34.0)
MCHC: 31.9 g/dL (ref 30.0–36.0)
MCV: 85.2 fL (ref 78.0–100.0)
Monocytes Absolute: 0.5 10*3/uL (ref 0.1–1.0)
Monocytes Relative: 6 % (ref 3–12)
NEUTROS ABS: 3.7 10*3/uL (ref 1.7–7.7)
Neutrophils Relative %: 51 % (ref 43–77)
PLATELETS: 265 10*3/uL (ref 150–400)
RBC: 5.01 MIL/uL (ref 3.87–5.11)
RDW: 14 % (ref 11.5–15.5)
WBC: 7.3 10*3/uL (ref 4.0–10.5)

## 2013-08-02 LAB — URINALYSIS, ROUTINE W REFLEX MICROSCOPIC
Bilirubin Urine: NEGATIVE
GLUCOSE, UA: NEGATIVE mg/dL
HGB URINE DIPSTICK: NEGATIVE
Ketones, ur: NEGATIVE mg/dL
LEUKOCYTES UA: NEGATIVE
Nitrite: NEGATIVE
PH: 5 (ref 5.0–8.0)
PROTEIN: NEGATIVE mg/dL
SPECIFIC GRAVITY, URINE: 1.024 (ref 1.005–1.030)
Urobilinogen, UA: 0.2 mg/dL (ref 0.0–1.0)

## 2013-08-02 LAB — BASIC METABOLIC PANEL
BUN: 19 mg/dL (ref 6–23)
CALCIUM: 9.5 mg/dL (ref 8.4–10.5)
CHLORIDE: 102 meq/L (ref 96–112)
CO2: 22 meq/L (ref 19–32)
Creatinine, Ser: 0.91 mg/dL (ref 0.50–1.10)
GFR calc Af Amer: 84 mL/min — ABNORMAL LOW (ref >90)
GFR calc non Af Amer: 73 mL/min — ABNORMAL LOW (ref >90)
GLUCOSE: 95 mg/dL (ref 70–99)
Potassium: 4.1 mEq/L (ref 3.7–5.3)
SODIUM: 138 meq/L (ref 137–147)

## 2013-08-02 LAB — LIPASE, BLOOD: Lipase: 46 U/L (ref 11–59)

## 2013-08-02 MED ORDER — ONDANSETRON 8 MG PO TBDP
8.0000 mg | ORAL_TABLET | Freq: Once | ORAL | Status: AC
  Administered 2013-08-02: 8 mg via ORAL
  Filled 2013-08-02: qty 1

## 2013-08-02 MED ORDER — OXYCODONE-ACETAMINOPHEN 5-325 MG PO TABS
1.0000 | ORAL_TABLET | Freq: Once | ORAL | Status: AC
  Administered 2013-08-02: 1 via ORAL
  Filled 2013-08-02: qty 1

## 2013-08-02 NOTE — ED Provider Notes (Signed)
CSN: [id number]     Arrival date & time 08/02/13  1902 History   First MD Initiated Contact with Patient 08/02/13 2305     Chief Complaint  Patient presents with  . Abdominal Pain     (Consider location/radiation/quality/duration/timing/severity/associated sxs/prior Treatment) Patient is a 48 y.o. female presenting with abdominal pain. The history is provided by the patient.  Abdominal Pain  She complains of right upper quadrant, and generalized abdominal pain, for several days. It is worse with eating. She describes a "pulling" sensation. She has not had fever, vomiting, or diarrhea. She has mild, nausea. She's never had this previously. No prior abdominal surgeries. He has had a hysterectomy. There are no other known modifying factors.  Past Medical History  Diagnosis Date  . Hypertension    Past Surgical History  Procedure Laterality Date  . Cesarean section     No family history on file. History  Substance Use Topics  . Smoking status: Current Some Day Smoker -- 0.50 packs/day  . Smokeless tobacco: Not on file  . Alcohol Use: Yes     Comment: socially    OB History   Grav Para Term Preterm Abortions TAB SAB Ect Mult Living                 Review of Systems  Gastrointestinal: Positive for abdominal pain.  All other systems reviewed and are negative.     Allergies  Sulfa antibiotics  Home Medications   Prior to Admission medications   Medication Sig Start Date End Date Taking? Authorizing Provider  clobetasol ointment (TEMOVATE) 9.56 % Apply 1 application topically 2 (two) times daily. For up to 2 weeks. 06/10/13  Yes Leone Haven, MD  Cyanocobalamin (VITAMIN B 12 PO) Take 1 tablet by mouth daily.   Yes Historical Provider, MD  diphenhydrAMINE (BENADRYL) 25 mg capsule Take 25 mg by mouth every 6 (six) hours as needed for itching or allergies.   Yes Historical Provider, MD  hydrochlorothiazide (MICROZIDE) 12.5 MG capsule Take 2 capsules (25 mg total) by mouth  every morning. 06/10/13  Yes Leone Haven, MD  ibuprofen (ADVIL,MOTRIN) 200 MG tablet Take 800 mg by mouth every 6 (six) hours as needed. Pain    Yes Historical Provider, MD  polyethylene glycol powder (GLYCOLAX/MIRALAX) powder Take 17 g by mouth daily as needed for moderate constipation. 06/10/13  Yes Leone Haven, MD  metFORMIN (GLUCOPHAGE) 500 MG tablet Take 1 tablet (500 mg total) by mouth 2 (two) times daily with a meal. 07/26/11 07/25/12  Lyndal Pulley, DO   BP 129/78  Pulse 68  Temp(Src) 98 F (36.7 C) (Oral)  Resp 18  Ht 5\' 4"  (1.626 m)  Wt 222 lb (100.699 kg)  BMI 38.09 kg/m2  SpO2 100%  LMP 07/05/2013 Physical Exam  Nursing note and vitals reviewed. Constitutional: She is oriented to person, place, and time. She appears well-developed and well-nourished.  HENT:  Head: Normocephalic and atraumatic.  Eyes: Conjunctivae and EOM are normal. Pupils are equal, round, and reactive to light.  Neck: Normal range of motion and phonation normal. Neck supple.  Cardiovascular: Normal rate, regular rhythm and intact distal pulses.   Pulmonary/Chest: Effort normal and breath sounds normal. She exhibits no tenderness.  Abdominal: Soft. She exhibits no distension and no mass. There is tenderness (Diffuse abdominal tenderness, with somewhat more right upper quadrant tenderness.). There is no rebound and no guarding.  Musculoskeletal: Normal range of motion. She exhibits no edema and no tenderness.  Neurological: She is alert and oriented to person, place, and time. She exhibits normal muscle tone.  Skin: Skin is warm and dry.  Psychiatric: She has a normal mood and affect. Her behavior is normal. Judgment and thought content normal.    ED Course  Procedures (including critical care time)  Medications  oxyCODONE-acetaminophen (PERCOCET/ROXICET) 5-325 MG per tablet 1 tablet (1 tablet Oral Given 08/02/13 2332)  ondansetron (ZOFRAN-ODT) disintegrating tablet 8 mg (8 mg Oral Given 08/02/13  2332)  oxyCODONE-acetaminophen (PERCOCET/ROXICET) 5-325 MG per tablet 1 tablet (1 tablet Oral Given 08/03/13 0118)    Patient Vitals for the past 24 hrs:  BP Temp Temp src Pulse Resp SpO2 Height Weight  08/03/13 0013 129/78 mmHg 98 F (36.7 C) Oral 68 18 100 % - -  08/02/13 1931 131/88 mmHg 98.3 F (36.8 C) Oral 72 18 99 % 5\' 4"  (1.626 m) 222 lb (100.699 kg)    2:04 AM Reevaluation with update and discussion. After initial assessment and treatment, an updated evaluation reveals she is comfortable now. Findings discussed with patient and family members, all questions answered. Richarda Blade    Labs Review Labs Reviewed  BASIC METABOLIC PANEL - Abnormal; Notable for the following:    GFR calc non Af Amer 73 (*)    GFR calc Af Amer 84 (*)    All other components within normal limits  URINALYSIS, ROUTINE W REFLEX MICROSCOPIC - Abnormal; Notable for the following:    APPearance CLOUDY (*)    All other components within normal limits  COMPREHENSIVE METABOLIC PANEL - Abnormal; Notable for the following:    Total Bilirubin 0.2 (*)    GFR calc non Af Amer 70 (*)    GFR calc Af Amer 81 (*)    All other components within normal limits  CBC WITH DIFFERENTIAL  LIPASE, BLOOD  POC URINE PREG, ED    Imaging Review US Abdomen Complete  08/03/2013   CLINICAL DATA:  Chronic diffuse abdominal pain and nausea.  EXAM: ULTRASOUND ABDOMEN COMPLETE  COMPARISON:  None.  FINDINGS: Gallbladder:  There is a large mobile stone in the gallbladder, measuring 2.3 cm diameter. No gallbladder wall thickening or sludge. Murphy's sign is negative.  Common bile duct:  Diameter: 8 mm, mildly dilated.  No intrahepatic ductal dilatation.  Liver:  No focal lesion identified. Within normal limits in parenchymal echogenicity.  IVC:  No abnormality visualized.  Pancreas:  Not visualized due to overlying bowel gas.  Spleen:  Limited visualization.  Visualized portion appears unremarkable.  Right Kidney:  Length: 10 cm.  Echogenicity within normal limits. No mass or hydronephrosis visualized.  Left Kidney:  Length: 11.3 cm. Echogenicity within normal limits. No mass or hydronephrosis visualized.  Abdominal aorta:  Partially obscured by overlying bowel gas. Visualized portions are not aneurysmal.  Other findings:  None.  IMPRESSION: Cholelithiasis with single mobile stone in the gallbladder. Borderline dilatation of common bowel duct.   Electronically Signed   By: Lucienne Capers M.D.   On: 08/03/2013 01:04     EKG Interpretation None      MDM   Final diagnoses:  Cholelithiases    Abdominal pain, likely secondary to cholelithiasis. No evidence for cholecystitis, or systemic illness, or metabolic instability.  Nursing Notes Reviewed/ Care Coordinated Applicable Imaging Reviewed Interpretation of Laboratory Data incorporated into ED treatment  The patient appears reasonably screened and/or stabilized for discharge and I doubt any other medical condition or other [facility] requiring further screening, evaluation, or treatment in the  ED at this time prior to discharge.  Plan: Home Medications- Percocet, Zofran; Home Treatments- low-fat diet; return here if the recommended treatment, does not improve the symptoms; Recommended follow up- General Surgery 1 week    Richarda Blade, MD 08/03/13 [phone]

## 2013-08-02 NOTE — ED Notes (Signed)
Pt presents with c/o right upper quadrant abdominal pain. Pt says she does feel nauseated but no vomiting or diarrhea. Pt denies any urinary symptoms. Pt says the pain has been there for a week and feels like a pulling sensation.

## 2013-08-03 ENCOUNTER — Emergency Department (HOSPITAL_COMMUNITY): Payer: No Typology Code available for payment source

## 2013-08-03 MED ORDER — OXYCODONE-ACETAMINOPHEN 5-325 MG PO TABS
1.0000 | ORAL_TABLET | Freq: Once | ORAL | Status: AC
  Administered 2013-08-03: 1 via ORAL
  Filled 2013-08-03: qty 1

## 2013-08-03 MED ORDER — ONDANSETRON HCL 8 MG PO TABS: 8.0000 mg | ORAL_TABLET | Freq: Three times a day (TID) | ORAL | Status: DC | PRN

## 2013-08-03 MED ORDER — OXYCODONE-ACETAMINOPHEN 5-325 MG PO TABS: 1.0000 | ORAL_TABLET | ORAL | Status: DC | PRN

## 2013-08-03 NOTE — ED Notes (Signed)
Ultrasound in process

## 2013-08-03 NOTE — Discharge Instructions (Signed)
Cholelithiasis Cholelithiasis (also called gallstones) is a form of gallbladder disease in which gallstones form in your gallbladder. The gallbladder is an organ that stores bile made in the liver, which helps digest fats. Gallstones begin as small crystals and slowly grow into stones. Gallstone pain occurs when the gallbladder spasms and a gallstone is blocking the duct. Pain can also occur when a stone passes out of the duct.  RISK FACTORS  Being female.   Having multiple pregnancies. Health care providers sometimes advise removing diseased gallbladders before future pregnancies.   Being obese.  Eating a diet heavy in fried foods and fat.   Being older than 12 years and increasing age.   Prolonged use of medicines containing female hormones.   Having diabetes mellitus.   Rapidly losing weight.   Having a family history of gallstones (heredity).  SYMPTOMS  Nausea.   Vomiting.  Abdominal pain.   Yellowing of the skin (jaundice).   Sudden pain. It may persist from several minutes to several hours.  Fever.   Tenderness to the touch. In some cases, when gallstones do not move into the bile duct, people have no pain or symptoms. These are called "silent" gallstones.  TREATMENT Silent gallstones do not need treatment. In severe cases, emergency surgery may be required. Options for treatment include:  Surgery to remove the gallbladder. This is the most common treatment.  Medicines. These do not always work and may take 6 12 months or more to work.  Shock wave treatment (extracorporeal biliary lithotripsy). In this treatment an ultrasound machine sends shock waves to the gallbladder to break gallstones into smaller pieces that can pass into the intestines or be dissolved by medicine. HOME CARE INSTRUCTIONS   Only take over-the-counter or prescription medicines for pain, discomfort, or fever as directed by your health care provider.   Follow a low-fat diet until  seen again by your health care provider. Fat causes the gallbladder to contract, which can result in pain.   Follow up with your health care provider as directed. Attacks are almost always recurrent and surgery is usually required for permanent treatment.  SEEK IMMEDIATE MEDICAL CARE IF:   Your pain increases and is not controlled by medicines.   You have a fever or persistent symptoms for more than 2 3 days.   You have a fever and your symptoms suddenly get worse.   You have persistent nausea and vomiting.  MAKE SURE YOU:   Understand these instructions.  Will watch your condition.  Will get help right away if you are not doing well or get worse. Document Released: 03/07/2005 Document Revised: 11/11/2012 Document Reviewed: 09/02/2012 [facility] Patient Information 2014 [place].  Fat and Cholesterol Control Diet Fat and cholesterol levels in your blood and organs are influenced by your diet. High levels of fat and cholesterol may lead to diseases of the heart, small and large blood vessels, gallbladder, liver, and pancreas. CONTROLLING FAT AND CHOLESTEROL WITH DIET Although exercise and lifestyle factors are important, your diet is key. That is because certain foods are known to raise cholesterol and others to lower it. The goal is to balance foods for their effect on cholesterol and more importantly, to replace saturated and trans fat with other types of fat, such as monounsaturated fat, polyunsaturated fat, and omega-3 fatty acids. On average, a person should consume no more than 15 to 17 g of saturated fat daily. Saturated and trans fats are considered "bad" fats, and they will raise LDL cholesterol. Saturated  fats are primarily found in animal products such as meats, butter, and cream. However, that does not mean you need to give up all your favorite foods. Today, there are good tasting, low-fat, low-cholesterol substitutes for most of the things you like to eat. Choose  low-fat or nonfat alternatives. Choose round or loin cuts of red meat. These types of cuts are lowest in fat and cholesterol. Chicken (without the skin), fish, veal, and ground [place] breast are great choices. Eliminate fatty meats, such as hot dogs and salami. Even shellfish have little or no saturated fat. Have a 3 oz (85 g) portion when you eat lean meat, poultry, or fish. Trans fats are also called "partially hydrogenated oils." They are oils that have been scientifically manipulated so that they are solid at room temperature resulting in a longer shelf life and improved taste and texture of foods in which they are added. Trans fats are found in stick margarine, some tub margarines, cookies, crackers, and baked goods.  When baking and cooking, oils are a great substitute for butter. The monounsaturated oils are especially beneficial since it is believed they lower LDL and raise HDL. The oils you should avoid entirely are saturated tropical oils, such as coconut and palm.  Remember to eat a lot from food groups that are naturally free of saturated and trans fat, including fish, fruit, vegetables, beans, grains (barley, rice, couscous, bulgur wheat), and pasta (without cream sauces).  IDENTIFYING FOODS THAT LOWER FAT AND CHOLESTEROL  Soluble fiber may lower your cholesterol. This type of fiber is found in fruits such as apples, vegetables such as broccoli, potatoes, and carrots, legumes such as beans, peas, and lentils, and grains such as barley. Foods fortified with plant sterols (phytosterol) may also lower cholesterol. You should eat at least 2 g per day of these foods for a cholesterol lowering effect.  Read package labels to identify low-saturated fats, trans fat free, and low-fat foods at the supermarket. Select cheeses that have only 2 to 3 g saturated fat per ounce. Use a heart-healthy tub margarine that is free of trans fats or partially hydrogenated oil. When buying baked goods (cookies, crackers),  avoid partially hydrogenated oils. Breads and muffins should be made from whole grains (whole-wheat or whole oat flour, instead of "flour" or "enriched flour"). Buy non-creamy canned soups with reduced salt and no added fats.  FOOD PREPARATION TECHNIQUES  Never deep-fry. If you must fry, either stir-fry, which uses very little fat, or use non-stick cooking sprays. When possible, broil, bake, or roast meats, and steam vegetables. Instead of putting butter or margarine on vegetables, use lemon and herbs, applesauce, and cinnamon (for squash and sweet potatoes). Use nonfat yogurt, salsa, and low-fat dressings for salads.  LOW-SATURATED FAT / LOW-FAT FOOD SUBSTITUTES Meats / Saturated Fat (g)  Avoid: Steak, marbled (3 oz/85 g) / 11 g  Choose: Steak, lean (3 oz/85 g) / 4 g  Avoid: Hamburger (3 oz/85 g) / 7 g  Choose: Hamburger, lean (3 oz/85 g) / 5 g  Avoid: Ham (3 oz/85 g) / 6 g  Choose: Ham, lean cut (3 oz/85 g) / 2.4 g  Avoid: Chicken, with skin, dark meat (3 oz/85 g) / 4 g  Choose: Chicken, skin removed, dark meat (3 oz/85 g) / 2 g  Avoid: Chicken, with skin, light meat (3 oz/85 g) / 2.5 g  Choose: Chicken, skin removed, light meat (3 oz/85 g) / 1 g Dairy / Saturated Fat (g)  Avoid: Whole milk (  1 cup) / 5 g  Choose: Low-fat milk, 2% (1 cup) / 3 g  Choose: Low-fat milk, 1% (1 cup) / 1.5 g  Choose: Skim milk (1 cup) / 0.3 g  Avoid: Hard cheese (1 oz/28 g) / 6 g  Choose: Skim milk cheese (1 oz/28 g) / 2 to 3 g  Avoid: Cottage cheese, 4% fat (1 cup) / 6.5 g  Choose: Low-fat cottage cheese, 1% fat (1 cup) / 1.5 g  Avoid: Ice cream (1 cup) / 9 g  Choose: Sherbet (1 cup) / 2.5 g  Choose: Nonfat frozen yogurt (1 cup) / 0.3 g  Choose: Frozen fruit bar / trace  Avoid: Whipped cream (1 tbs) / 3.5 g  Choose: Nondairy whipped topping (1 tbs) / 1 g Condiments / Saturated Fat (g)  Avoid: Mayonnaise (1 tbs) / 2 g  Choose: Low-fat mayonnaise (1 tbs) / 1 g  Avoid: Butter (1  tbs) / 7 g  Choose: Extra light margarine (1 tbs) / 1 g  Avoid: Coconut oil (1 tbs) / 11.8 g  Choose: Olive oil (1 tbs) / 1.8 g  Choose: Corn oil (1 tbs) / 1.7 g  Choose: Safflower oil (1 tbs) / 1.2 g  Choose: Sunflower oil (1 tbs) / 1.4 g  Choose: Soybean oil (1 tbs) / 2.4 g  Choose: Canola oil (1 tbs) / 1 g Document Released: 03/11/2005 Document Revised: 07/06/2012 Document Reviewed: 08/30/2010 [employer] Patient Information 2014 [place], [place].

## 2013-08-11 ENCOUNTER — Telehealth: Payer: Self-pay | Admitting: Family Medicine

## 2013-08-11 ENCOUNTER — Encounter: Payer: Self-pay | Admitting: Internal Medicine

## 2013-08-11 ENCOUNTER — Encounter (INDEPENDENT_AMBULATORY_CARE_PROVIDER_SITE_OTHER): Payer: Self-pay | Admitting: General Surgery

## 2013-08-11 ENCOUNTER — Other Ambulatory Visit (INDEPENDENT_AMBULATORY_CARE_PROVIDER_SITE_OTHER): Payer: Self-pay

## 2013-08-11 ENCOUNTER — Ambulatory Visit (INDEPENDENT_AMBULATORY_CARE_PROVIDER_SITE_OTHER): Payer: No Typology Code available for payment source | Admitting: General Surgery

## 2013-08-11 VITALS — BP 130/80 | HR 71 | Temp 97.0°F | Ht 64.0 in | Wt 216.0 lb

## 2013-08-11 DIAGNOSIS — R109 Unspecified abdominal pain: Secondary | ICD-10-CM

## 2013-08-11 NOTE — Telephone Encounter (Signed)
Is in pain and has been for over 2 weeks. She is very upset.  She is scheduled to have an endoscopy in July She cant stand the pain until then Please advise

## 2013-08-11 NOTE — Progress Notes (Signed)
Patient ID: Lisa Sullivan, female   DOB: May 13, 1965, 48 y.o.   MRN: [id number]  Chief Complaint  Patient presents with  . eval gallbladder    HPI Lisa Sullivan is a 48 y.o. female.  The patient is a 48 year old female who is recently seen in the ER secondary to epigastric abdominal pain. Patient states his first occurrence. Patient underwent ultrasound which revealed a mobile gallstone with normal laboratory functions as well as no signs of acute cholecystitis. Patient states that since that time the pain is continuous been in the epigastrium as well as left upper quadrant. Patient does state she has a history of dysmenorrhea and does take ibuprofen for pain.    HPI  Past Medical History  Diagnosis Date  . Hypertension     Past Surgical History  Procedure Laterality Date  . Cesarean section      Family History  Problem Relation Age of Onset  . Diabetes Mother   . Stroke Father     Social History History  Substance Use Topics  . Smoking status: Current Some Day Smoker -- 0.50 packs/day  . Smokeless tobacco: Not on file  . Alcohol Use: Yes     Comment: socially     Allergies  Allergen Reactions  . Sulfa Antibiotics Hives    Current Outpatient Prescriptions  Medication Sig Dispense Refill  . clobetasol ointment (TEMOVATE) 3.71 % Apply 1 application topically 2 (two) times daily. For up to 2 weeks.  30 g  0  . diphenhydrAMINE (BENADRYL) 25 mg capsule Take 25 mg by mouth every 6 (six) hours as needed for itching or allergies.      . hydrochlorothiazide (MICROZIDE) 12.5 MG capsule Take 2 capsules (25 mg total) by mouth every morning.  60 capsule  3  . ibuprofen (ADVIL,MOTRIN) 200 MG tablet Take 800 mg by mouth every 6 (six) hours as needed. Pain       . ondansetron (ZOFRAN) 8 MG tablet Take 1 tablet (8 mg total) by mouth every 8 (eight) hours as needed for nausea or vomiting.  20 tablet  0  . oxyCODONE-acetaminophen (PERCOCET) 5-325 MG per tablet Take 1 tablet by mouth  every 4 (four) hours as needed for severe pain.  20 tablet  0  . polyethylene glycol powder (GLYCOLAX/MIRALAX) powder Take 17 g by mouth daily as needed for moderate constipation.  3350 g  1  . metFORMIN (GLUCOPHAGE) 500 MG tablet Take 1 tablet (500 mg total) by mouth 2 (two) times daily with a meal.  180 tablet  3   No current facility-administered medications for this visit.    Review of Systems Review of Systems  Constitutional: Negative.   HENT: Negative.   Respiratory: Negative.   Cardiovascular: Negative.   Gastrointestinal: Negative.   Neurological: Negative.   All other systems reviewed and are negative.   Blood pressure 130/80, pulse 71, temperature 97 F (36.1 C), height 5\' 4"  (1.626 m), weight 216 lb (97.977 kg), last menstrual period 07/05/2013.  Physical Exam Physical Exam  Constitutional: She is oriented to person, place, and time. She appears well-developed and well-nourished.  HENT:  Head: Normocephalic and atraumatic.  Eyes: Conjunctivae and EOM are normal. Pupils are equal, round, and reactive to light.  Neck: Normal range of motion. Neck supple.  Cardiovascular: Normal rate, regular rhythm and normal heart sounds.   Pulmonary/Chest: Effort normal and breath sounds normal.  Abdominal: Soft. Bowel sounds are normal. There is tenderness in the right upper quadrant and left  upper quadrant.  Musculoskeletal: Normal range of motion.  Neurological: She is alert and oriented to person, place, and time.  Skin: Skin is warm and dry.  Psychiatric: She has a normal mood and affect.    Data Reviewed As above  Assessment    48 year old female with cholelithiasis, questionable symptomatic. Patient does have possible history for ulcers secondary to ibuprofen usage.     Plan    1.  we will have patient referred  To GI for workup for ulcers, possible endoscopy. 2. I discussed with the patient that she might require gallbladder be removed should this workup was  negative.        Ralene Ok 08/11/2013, 3:19 PM

## 2013-08-12 NOTE — Telephone Encounter (Signed)
Reviewed notes from recent ED visit and general surgery office visit. Patient likely has gastritis/peptic ulcer or pain related to gallstones based on the review of notes. We could try a PPI to see if that would help, though I would like her to come in to the office for evaluation as this is a new problem. Please advise the patient of this. Thanks.

## 2013-08-13 NOTE — Telephone Encounter (Signed)
No answer and no machine.  When pt returns call, please inform of the below. Laban Emperor Pete Merten

## 2013-08-19 ENCOUNTER — Encounter: Payer: Self-pay | Admitting: Family Medicine

## 2013-08-19 ENCOUNTER — Ambulatory Visit (INDEPENDENT_AMBULATORY_CARE_PROVIDER_SITE_OTHER): Payer: No Typology Code available for payment source | Admitting: Family Medicine

## 2013-08-19 VITALS — BP 115/78 | HR 78 | Temp 98.2°F | Wt 217.0 lb

## 2013-08-19 DIAGNOSIS — R1013 Epigastric pain: Secondary | ICD-10-CM

## 2013-08-19 MED ORDER — TRAMADOL HCL 50 MG PO TABS: 50.0000 mg | ORAL_TABLET | Freq: Three times a day (TID) | ORAL | Status: DC | PRN

## 2013-08-19 MED ORDER — PANTOPRAZOLE SODIUM 40 MG PO TBEC: 40.0000 mg | DELAYED_RELEASE_TABLET | Freq: Every day | ORAL | Status: DC

## 2013-08-19 NOTE — Progress Notes (Signed)
Patient ID: Lisa Sullivan, female   DOB: [birth year], 48 y.o.   MRN: [id number]  Tommi Rumps, MD Phone: [phone]  Lisa Sullivan is a 48 y.o. female who presents today for a new complaint.  Abdominal pain: Patient reports onset of epigastric pain at the beginning of May. Went to the ED and found to have cholelithiasis. Was sent to general surgery for evaluation and Dr Rosendo Gros felt this was likely related to PUD and referred the patient to GI for evaluation. She notes now that her abdominal pain is slightly improved. She notes she has not been eating much. Describes the pain as a pulling sensation. Hurts more when she eats. Notes some nausea though no vomiting or diarrhea. She has never had this before. She notes taking motrin for her menstrual cramps. Notes the vicodin given in the ED did not help. She reports occasionally the pain radiates to the LUQ. No blood in her stool.  Patient is a smoker.   ROS: Per HPI   Physical Exam Filed Vitals:   08/19/13 1427  BP: 115/78  Pulse: 78  Temp: 98.2 F (36.8 C)    Gen: Well NAD HEENT: PERRL,  MMM Lungs: CTABL Nl WOB Heart: RRR no MRG Abd: soft, mild tenderness to palpation in epigastric region, ND, no guarding or rebound Exts: Non edematous BL  LE, warm and well perfused.    Assessment/Plan: Please see individual problem list.

## 2013-08-19 NOTE — Patient Instructions (Signed)
Nice to see you. I think your pain is likely related to gastritis or an ulcer. We will start you on a PPI called protonix for this. Please keep your appointment with the GI physician in July. Please stop taking ibuprofen for pain. You can try the tramadol I have prescribed or tylenol.  Gastritis, Adult Gastritis is soreness and swelling (inflammation) of the lining of the stomach. Gastritis can develop as a sudden onset (acute) or long-term (chronic) condition. If gastritis is not treated, it can lead to stomach bleeding and ulcers. CAUSES  Gastritis occurs when the stomach lining is weak or damaged. Digestive juices from the stomach then inflame the weakened stomach lining. The stomach lining may be weak or damaged due to viral or bacterial infections. One common bacterial infection is the Helicobacter pylori infection. Gastritis can also result from excessive alcohol consumption, taking certain medicines, or having too much acid in the stomach.  SYMPTOMS  In some cases, there are no symptoms. When symptoms are present, they may include:  Pain or a burning sensation in the upper abdomen.  Nausea.  Vomiting.  An uncomfortable feeling of fullness after eating. DIAGNOSIS  Your caregiver may suspect you have gastritis based on your symptoms and a physical exam. To determine the cause of your gastritis, your caregiver may perform the following:  Blood or stool tests to check for the H pylori bacterium.  Gastroscopy. A thin, flexible tube (endoscope) is passed down the esophagus and into the stomach. The endoscope has a light and camera on the end. Your caregiver uses the endoscope to view the inside of the stomach.  Taking a tissue sample (biopsy) from the stomach to examine under a microscope. TREATMENT  Depending on the cause of your gastritis, medicines may be prescribed. If you have a bacterial infection, such as an H pylori infection, antibiotics may be given. If your gastritis is caused  by too much acid in the stomach, H2 blockers or antacids may be given. Your caregiver may recommend that you stop taking aspirin, ibuprofen, or other nonsteroidal anti-inflammatory drugs (NSAIDs). HOME CARE INSTRUCTIONS  Only take over-the-counter or prescription medicines as directed by your caregiver.  If you were given antibiotic medicines, take them as directed. Finish them even if you start to feel better.  Drink enough fluids to keep your urine clear or pale yellow.  Avoid foods and drinks that make your symptoms worse, such as:  Caffeine or alcoholic drinks.  Chocolate.  Peppermint or mint flavorings.  Garlic and onions.  Spicy foods.  Citrus fruits, such as oranges, lemons, or limes.  Tomato-based foods such as sauce, chili, salsa, and pizza.  Fried and fatty foods.  Eat small, frequent meals instead of large meals. SEEK IMMEDIATE MEDICAL CARE IF:   You have black or dark red stools.  You vomit blood or material that looks like coffee grounds.  You are unable to keep fluids down.  Your abdominal pain gets worse.  You have a fever.  You do not feel better after 1 week.  You have any other questions or concerns. MAKE SURE YOU:  Understand these instructions.  Will watch your condition.  Will get help right away if you are not doing well or get worse. Document Released: 03/05/2001 Document Revised: 09/10/2011 Document Reviewed: 04/24/2011 [facility] Patient Information 2014 [place].

## 2013-08-19 NOTE — Assessment & Plan Note (Signed)
Patient with epigastric abdominal pain for the past month. Has been evaluated by gen surgery and referred to GI already. She does have evidence of cholelithiasis, though location of pain does not correlate with this. Potentially has a gastritis vs PUD possibly related to motrin use. I have advised to stop taking motrin. Will try tramadol for her pain. Will also give a trial of protonix for her epigastric discomfort. She is to keep her appointment with GI in July. I will see her back in one month.

## 2013-09-21 ENCOUNTER — Ambulatory Visit (INDEPENDENT_AMBULATORY_CARE_PROVIDER_SITE_OTHER): Payer: No Typology Code available for payment source | Admitting: Family Medicine

## 2013-09-21 ENCOUNTER — Encounter: Payer: Self-pay | Admitting: Family Medicine

## 2013-09-21 VITALS — BP 136/84 | HR 69 | Temp 97.8°F | Wt 218.0 lb

## 2013-09-21 DIAGNOSIS — F17200 Nicotine dependence, unspecified, uncomplicated: Secondary | ICD-10-CM

## 2013-09-21 DIAGNOSIS — R1013 Epigastric pain: Secondary | ICD-10-CM

## 2013-09-21 DIAGNOSIS — I10 Essential (primary) hypertension: Secondary | ICD-10-CM

## 2013-09-21 NOTE — Patient Instructions (Signed)
Nice to see you. Your blood pressure is doing well. Please take one tablet of HCTZ each day. Please keep your appointment with the stomach doctor. If you ever need help quitting smoking please let me know.

## 2013-09-21 NOTE — Assessment & Plan Note (Signed)
At goal today. HCTZ 12.5 mg daily. F/u in 3 months.

## 2013-09-21 NOTE — Assessment & Plan Note (Signed)
Has improved on protonix. Will continue this medication and plan for already scheduled appointment with GI in July. F/u prn.

## 2013-09-21 NOTE — Assessment & Plan Note (Signed)
Continues to smoke. Encouraged to quit. Offered support if she desires to quit in the future. Will continue to encourage to quit.

## 2013-09-21 NOTE — Progress Notes (Signed)
Patient ID: Lisa Sullivan, female   DOB: [birth year], 48 y.o.   MRN: [id number]  Tommi Rumps, MD Phone: [phone]  Lisa Sullivan is a 48 y.o. female who presents today for f/u.  HYPERTENSION Disease Monitoring Home BP Monitoring not checking, states last checked at pharmacy and was 482 systolic Chest pain- no    Dyspnea- no Medications Compliance-  Taking HCTZ 1-2 tablets daily  Edema- no  Epigastric pain: patient reports this is improved on protonix. She notes she has changed her diet and does not eat fried foods. No discomfort this month. Has an appointment to see GI July 22nd. She denies blood in her stool. She does note an episode of diarrhea and vomiting and fever last week though this resolved.   Smoking: patient notes she is still smoking 2-3 cigarettes per day. She states she would be able to quit on her own if she wanted. She does not desire to quit at this time. Her work hours limit her from smoking more as she is not allowed to smoke while at work.  Patient is a smoker.   ROS: Per HPI   Physical Exam Filed Vitals:   09/21/13 1602  BP: 136/84  Pulse: 69  Temp: 97.8 F (36.6 C)    Gen: Well NAD HEENT: PERRL, MMM Lungs: CTABL Nl WOB Heart: RRR no MRG Abd: NABS, mild epigastric tenderness, ND, no guarding or rebound Exts: Non edematous BL  LE, warm and well perfused.    Assessment/Plan: Please see individual problem list.  # Healthcare maintenance: up to date

## 2013-10-13 ENCOUNTER — Encounter: Payer: Self-pay | Admitting: Internal Medicine

## 2013-10-13 ENCOUNTER — Ambulatory Visit (INDEPENDENT_AMBULATORY_CARE_PROVIDER_SITE_OTHER): Payer: No Typology Code available for payment source | Admitting: Internal Medicine

## 2013-10-13 VITALS — BP 138/90 | HR 68 | Ht 63.0 in | Wt 219.0 lb

## 2013-10-13 DIAGNOSIS — K5900 Constipation, unspecified: Secondary | ICD-10-CM

## 2013-10-13 DIAGNOSIS — R1084 Generalized abdominal pain: Secondary | ICD-10-CM

## 2013-10-13 DIAGNOSIS — Z1211 Encounter for screening for malignant neoplasm of colon: Secondary | ICD-10-CM

## 2013-10-13 MED ORDER — MOVIPREP 100 G PO SOLR: 1.0000 | Freq: Once | ORAL | Status: DC

## 2013-10-13 NOTE — Patient Instructions (Signed)

## 2013-10-13 NOTE — Progress Notes (Signed)
HISTORY OF PRESENT ILLNESS:  Lisa Sullivan is a 48 y.o. female with hypertension who presents today for evaluation of abdominal pain. The patient is not a particularly good historian but as best I can tell she has had problems with fairly constant upper abdominal pain for about 2 months. This on a background of more chronic abdominal discomfort, which in part, she relates to menstrual periods. No menstrual periods for the past 2 months. She states that her current discomfort is worse after meals. She states that it is constant. It is anterior without radiation to either side or back. There is nausea without vomiting. There has been no weight loss. She reports bowel movements approximately 2 times per week. This is unchanged from her baseline. She takes Dulcolax occasionally. More recently, a course of MiraLax which she states did not help. Placed on PPI in the past 6 weeks. Taking daily. Not particularly helpful. No GI bleeding. She did undergo evaluation in the emergency room on 08/03/2013. Ultrasound revealed cholelithiasis without complication. Other laboratories including CBC and comprehensive metabolic panel were normal. She did have outpatient evaluation with general surgery. They were not convinced that her pain was related to gallstones. GI evaluation requested.  REVIEW OF SYSTEMS:  All non-GI ROS negative except for sleeping problems and menstrual cramps  Past Medical History  Diagnosis Date  . Hypertension   . Gall stone     Past Surgical History  Procedure Laterality Date  . Cesarean section      Social History SHENEA GIACOBBE  reports that she has been smoking.  She has never used smokeless tobacco. She reports that she drinks alcohol. She reports that she does not use illicit drugs.  family history includes Diabetes in her mother; Stroke in her father.  Allergies  Allergen Reactions  . Sulfa Antibiotics Hives       PHYSICAL EXAMINATION: Vital signs: BP 138/90  Pulse 68   Ht 5\' 3"  (1.6 m)  Wt 219 lb (99.338 kg)  BMI 38.80 kg/m2  LMP 08/04/2013  Constitutional: Obese, generally well-appearing, no acute distress Psychiatric: alert and oriented x3, cooperative Eyes: extraocular movements intact, anicteric, conjunctiva pink Mouth: oral pharynx moist, no lesions Neck: supple no lymphadenopathy Cardiovascular: heart regular rate and rhythm, no murmur Lungs: clear to auscultation bilaterally Abdomen: Obese, soft, mild tenderness with minimal palpation in the midabdomen, nondistended, no obvious ascites, no peritoneal signs, normal bowel sounds, no organomegaly Rectal: Deferred until colonoscopy Extremities: no lower extremity edema bilaterally Skin: no lesions on visible extremities Neuro: No focal deficits.   ASSESSMENT:  #1. Chronic abdominal pain. Vague description. No improvement with PPI. Etiology unclear. Further evaluation warranted #2. Chronic constipation. Unchanged #3. Cholelithiasis. Question clinical significance #4. Colon cancer screening. Baseline risk   PLAN:  #1. Schedule diagnostic upper endoscopy to further evaluate upper abdominal pain.The nature of the procedure, as well as the risks, benefits, and alternatives were carefully and thoroughly reviewed with the patient. Ample time for discussion and questions allowed. The patient understood, was satisfied, and agreed to proceed. #2. Schedule colonoscopy to evaluate abdominal complaints, but to provide colon cancer screening and she is at the appropriate age.The nature of the procedure, as well as the risks, benefits, and alternatives were carefully and thoroughly reviewed with the patient. Ample time for discussion and questions allowed. The patient understood, was satisfied, and agreed to proceed. #3. If endoscopic evaluation is unrevealing, then contrast-enhanced CT scan of the abdomen and pelvis #4. If advanced imaging needed and unrevealing, then  problems Linzess see if this impacts bowel  habits and abdominal discomfort.

## 2013-10-25 ENCOUNTER — Other Ambulatory Visit: Payer: Self-pay

## 2013-10-25 ENCOUNTER — Encounter: Payer: Self-pay | Admitting: Internal Medicine

## 2013-10-25 ENCOUNTER — Ambulatory Visit (AMBULATORY_SURGERY_CENTER): Payer: No Typology Code available for payment source | Admitting: Internal Medicine

## 2013-10-25 VITALS — BP 143/73 | HR 67 | Temp 98.0°F | Resp 16 | Ht 63.0 in | Wt 219.0 lb

## 2013-10-25 DIAGNOSIS — D126 Benign neoplasm of colon, unspecified: Secondary | ICD-10-CM

## 2013-10-25 DIAGNOSIS — R109 Unspecified abdominal pain: Secondary | ICD-10-CM

## 2013-10-25 DIAGNOSIS — R1013 Epigastric pain: Secondary | ICD-10-CM

## 2013-10-25 DIAGNOSIS — R1084 Generalized abdominal pain: Secondary | ICD-10-CM

## 2013-10-25 DIAGNOSIS — Z1211 Encounter for screening for malignant neoplasm of colon: Secondary | ICD-10-CM

## 2013-10-25 MED ORDER — SODIUM CHLORIDE 0.9 % IV SOLN: 500.0000 mL | INTRAVENOUS | Status: DC

## 2013-10-25 NOTE — Op Note (Signed)
[place]  [address] [place], [id number]   COLONOSCOPY PROCEDURE REPORT  PATIENT: Lisa Sullivan, Lisa Sullivan  MR#: [id number] BIRTHDATE: 23-Mar-1966 , 47  yrs. old GENDER: Female ENDOSCOPIST: Eustace Quail, MD REFERRED [id number] Caryl Bis, M.D. (Family Practice) PROCEDURE DATE:  10/25/2013 PROCEDURE:   Colonoscopy with snare polypectomy x 1 First Screening Colonoscopy - Avg.  risk and is 50 yrs.  old or older Yes.  Prior Negative Screening - Now for repeat screening. N/A  History of Adenoma - Now for follow-up colonoscopy & has been > or = to 3 yrs.  N/A  Polyps Removed Today? Yes. ASA CLASS:   Class II INDICATIONS:average risk screening. MEDICATIONS: MAC sedation, administered by CRNA and propofol (Diprivan) 500mg  IV  DESCRIPTION OF PROCEDURE:   After the risks benefits and alternatives of the procedure were thoroughly explained, informed consent was obtained.  A digital rectal exam revealed no abnormalities of the rectum.   The LB [id number]  endoscope was introduced through the anus and advanced to the cecum, which was identified by both the appendix and ileocecal valve. No adverse events experienced.   The quality of the prep was excellent, using MoviPrep  The instrument was then slowly withdrawn as the colon was fully examined.  COLON FINDINGS: A diminutive polyp was found in the transverse colon.  A polypectomy was performed with a cold snare.  The resection was complete and the polyp tissue was completely retrieved.   The colon was otherwise normal.  There was no diverticulosis, inflammation, other polyps or cancers unless previously stated.  Retroflexed views revealed internal hemorrhoids. The time to cecum=2 minutes 36 seconds.  Withdrawal time=9 minutes 19 seconds.  The scope was withdrawn and the procedure completed. COMPLICATIONS: There were no complications.  ENDOSCOPIC IMPRESSION: 1.   Diminutive polyp was found in the transverse colon;  polypectomy was performed with a cold snare 2.   The colon was otherwise normal  RECOMMENDATIONS: 1.  Repeat colonoscopy in 5 years if polyp adenomatous; otherwise 10 years 2.  Upper endoscopy today (see report)   eSigned:  Eustace Quail, MD 10/25/2013 3:20 PM   cc: The Patient    ; Tommi Rumps, M.D. [facility])

## 2013-10-25 NOTE — Progress Notes (Signed)
A/ox3, pleased with MAC, report to RN 

## 2013-10-25 NOTE — Progress Notes (Signed)
Pt. Given contrast with paper to fill in with appointment time and other instructions related to CT scan.  Dr. Blanch Media office to schedule and get in touch with pt.  Pt. Advised to call office later in the week if she had not heard from the office.

## 2013-10-25 NOTE — Op Note (Signed)
[place]  [address] [place], [id number]   ENDOSCOPY PROCEDURE REPORT  PATIENT: Lisa Sullivan, Lisa Sullivan  MR#: [id number] BIRTHDATE: 02/18/1966 , 47  yrs. old GENDER: Female ENDOSCOPIST: Eustace Quail, MD REFERRED BY:  Tommi Rumps, M.D. PROCEDURE DATE:  10/25/2013 PROCEDURE:  EGD, diagnostic ASA CLASS:     Class II INDICATIONS:  abdominal pain. MEDICATIONS: MAC sedation, administered by CRNA and propofol (Diprivan) 150mg  IV TOPICAL ANESTHETIC: none  DESCRIPTION OF PROCEDURE: After the risks benefits and alternatives of the procedure were thoroughly explained, informed consent was obtained.  The LB [id number] endoscope was introduced through the mouth and advanced to the second portion of the duodenum. Without limitations.  The instrument was slowly withdrawn as the mucosa was fully examined.     EXAM:The upper, middle and distal third of the esophagus were carefully inspected and no abnormalities were noted.  The z-line was well seen at the GEJ.  The endoscope was pushed into the fundus which was normal including a retroflexed view.  The antrum, gastric body, first and second part of the duodenum were unremarkable. Retroflexed views revealed no abnormalities.     The scope was then withdrawn from the patient and the procedure completed.  COMPLICATIONS: There were no complications. ENDOSCOPIC IMPRESSION: 1.Normal EGD 2. No cause for abdominal pain found  RECOMMENDATIONS: 1.My office will arrange for you to have a contrast-enhanced CT scan of your abdomen and pelvis"abdominal pain, evaluate". 2. Consider trial of Linzess if CT negative  REPEAT EXAM:  eSigned:  Eustace Quail, MD 10/25/2013 3:23 PM   CC:The Patient and Tommi Rumps, MD  (Family Practice)

## 2013-10-25 NOTE — Progress Notes (Signed)
Called to room to assist during endoscopic procedure.  Patient ID and intended procedure confirmed with present staff. Received instructions for my participation in the procedure from the performing physician.  

## 2013-10-25 NOTE — Patient Instructions (Signed)
YOU HAD AN ENDOSCOPIC PROCEDURE TODAY AT THE [facility]: Refer to the procedure report that was given to you for any specific questions about what was found during the examination.  If the procedure report does not answer your questions, please call your gastroenterologist to clarify.  If you requested that your care partner not be given the details of your procedure findings, then the procedure report has been included in a sealed envelope for you to review at your convenience later.  YOU SHOULD EXPECT: Some feelings of bloating in the abdomen. Passage of more gas than usual.  Walking can help get rid of the air that was put into your GI tract during the procedure and reduce the bloating. If you had a lower endoscopy (such as a colonoscopy or flexible sigmoidoscopy) you may notice spotting of blood in your stool or on the toilet paper. If you underwent a bowel prep for your procedure, then you may not have a normal bowel movement for a few days.  DIET: Your first meal following the procedure should be a light meal and then it is ok to progress to your normal diet.  A half-sandwich or bowl of soup is an example of a good first meal.  Heavy or fried foods are harder to digest and may make you feel nauseous or bloated.  Likewise meals heavy in dairy and vegetables can cause extra gas to form and this can also increase the bloating.  Drink plenty of fluids but you should avoid alcoholic beverages for 24 hours.  ACTIVITY: Your care partner should take you home directly after the procedure.  You should plan to take it easy, moving slowly for the rest of the day.  You can resume normal activity the day after the procedure however you should NOT DRIVE or use heavy machinery for 24 hours (because of the sedation medicines used during the test).    SYMPTOMS TO REPORT IMMEDIATELY: A gastroenterologist can be reached at any hour.  During normal business hours, 8:30 AM to 5:00 PM Monday through Friday,  call [phone].  After hours and on weekends, please call the GI answering service at [phone] who will take a message and have the physician on call contact you.   Following lower endoscopy (colonoscopy or flexible sigmoidoscopy):  Excessive amounts of blood in the stool  Significant tenderness or worsening of abdominal pains  Swelling of the abdomen that is new, acute  Fever of 100F or higher  Following upper endoscopy (EGD)  Vomiting of blood or coffee ground material  New chest pain or pain under the shoulder blades  Painful or persistently difficult swallowing  New shortness of breath  Fever of 100F or higher  Black, tarry-looking stools  FOLLOW UP: If any biopsies were taken you will be contacted by phone or by letter within the next 1-3 weeks.  Call your gastroenterologist if you have not heard about the biopsies in 3 weeks.  Our staff will call the home number listed on your records the next business day following your procedure to check on you and address any questions or concerns that you may have at that time regarding the information given to you following your procedure. This is a courtesy call and so if there is no answer at the home number and we have not heard from you through the emergency physician on call, we will assume that you have returned to your regular daily activities without incident.  SIGNATURES/CONFIDENTIALITY: You and/or your care   partner have signed paperwork which will be entered into your electronic medical record.  These signatures attest to the fact that that the information above on your After Visit Summary has been reviewed and is understood.  Full responsibility of the confidentiality of this discharge information lies with you and/or your care-partner.   Polyp information given.  Dr. Henrene Pastor will let you know when to have next colonscopy after pathology report is reviewed.  Normal upper GI endoscopy. Dr. Blanch Media office will schedule a  contrast - enhanced CT scan of abdomen and pelvis for abdominal pain.

## 2013-10-26 ENCOUNTER — Telehealth: Payer: Self-pay

## 2013-10-26 ENCOUNTER — Telehealth: Payer: Self-pay | Admitting: *Deleted

## 2013-10-26 NOTE — Telephone Encounter (Signed)
  Follow up Call-  Call back number 10/25/2013  Post procedure Call Back phone  # [phone]  Permission to leave phone message Yes     Patient questions:  Message left to call us if necessary.

## 2013-10-26 NOTE — Telephone Encounter (Signed)
Pt scheduled for CT of A/P at [facility] CT 10/29/13@10 :30am. Pt to be NPO after 6:30am. Pt to drink B1 of contrast at 8:30am, and B2 at 9:30am. Spoke with pt and she states she needs this to be several weeks out due to work. Pt given the phone number [phone] to reschedule the CT to a date that works for her. Pt verbalized understanding.

## 2013-10-29 ENCOUNTER — Other Ambulatory Visit: Payer: No Typology Code available for payment source

## 2013-11-09 ENCOUNTER — Encounter: Payer: Self-pay | Admitting: Internal Medicine

## 2013-12-18 ENCOUNTER — Other Ambulatory Visit: Payer: Self-pay | Admitting: Family Medicine

## 2014-02-23 ENCOUNTER — Encounter (HOSPITAL_BASED_OUTPATIENT_CLINIC_OR_DEPARTMENT_OTHER): Payer: Self-pay | Admitting: *Deleted

## 2014-02-23 ENCOUNTER — Emergency Department (HOSPITAL_BASED_OUTPATIENT_CLINIC_OR_DEPARTMENT_OTHER)
Admission: EM | Admit: 2014-02-23 | Discharge: 2014-02-23 | Disposition: A | Discharge status: Home / Self Care | Payer: No Typology Code available for payment source | Attending: Emergency Medicine | Admitting: Emergency Medicine

## 2014-02-23 DIAGNOSIS — L509 Urticaria, unspecified: Secondary | ICD-10-CM | POA: Diagnosis not present

## 2014-02-23 DIAGNOSIS — Z720 Tobacco use: Secondary | ICD-10-CM | POA: Diagnosis not present

## 2014-02-23 DIAGNOSIS — I10 Essential (primary) hypertension: Secondary | ICD-10-CM | POA: Insufficient documentation

## 2014-02-23 DIAGNOSIS — Z79899 Other long term (current) drug therapy: Secondary | ICD-10-CM | POA: Diagnosis not present

## 2014-02-23 DIAGNOSIS — Z8719 Personal history of other diseases of the digestive system: Secondary | ICD-10-CM | POA: Insufficient documentation

## 2014-02-23 DIAGNOSIS — R21 Rash and other nonspecific skin eruption: Secondary | ICD-10-CM | POA: Diagnosis present

## 2014-02-23 MED ORDER — PREDNISONE 50 MG PO TABS
60.0000 mg | ORAL_TABLET | Freq: Once | ORAL | Status: AC
  Administered 2014-02-23: 60 mg via ORAL
  Filled 2014-02-23 (×2): qty 1

## 2014-02-23 MED ORDER — PREDNISONE 50 MG PO TABS: 50.0000 mg | ORAL_TABLET | Freq: Every day | ORAL | Status: DC

## 2014-02-23 NOTE — ED Notes (Signed)
Pt requesting med for itching

## 2014-02-23 NOTE — ED Notes (Signed)
Patient has raised itching rash on arms, back , legs, face

## 2014-02-23 NOTE — Discharge Instructions (Signed)
Hives Hives are itchy, red, swollen areas of the skin. They can vary in size and location on your body. Hives can come and go for hours or several days (acute hives) or for several weeks (chronic hives). Hives do not spread from person to person (noncontagious). They may get worse with scratching, exercise, and emotional stress. CAUSES   Allergic reaction to food, additives, or drugs.  Infections, including the common cold.  Illness, such as vasculitis, lupus, or thyroid disease.  Exposure to sunlight, heat, or cold.  Exercise.  Stress.  Contact with chemicals. SYMPTOMS   Red or white swollen patches on the skin. The patches may change size, shape, and location quickly and repeatedly.  Itching.  Swelling of the hands, feet, and face. This may occur if hives develop deeper in the skin. DIAGNOSIS  Your caregiver can usually tell what is wrong by performing a physical exam. Skin or blood tests may also be done to determine the cause of your hives. In some cases, the cause cannot be determined. TREATMENT  Mild cases usually get better with medicines such as antihistamines. Severe cases may require an emergency epinephrine injection. If the cause of your hives is known, treatment includes avoiding that trigger.  HOME CARE INSTRUCTIONS   Avoid causes that trigger your hives.  Take antihistamines as directed by your caregiver to reduce the severity of your hives. Non-sedating or low-sedating antihistamines are usually recommended. Do not drive while taking an antihistamine.  Take any other medicines prescribed for itching as directed by your caregiver.  Wear loose-fitting clothing.  Keep all follow-up appointments as directed by your caregiver. SEEK MEDICAL CARE IF:   You have persistent or severe itching that is not relieved with medicine.  You have painful or swollen joints. SEEK IMMEDIATE MEDICAL CARE IF:   You have a fever.  Your tongue or lips are swollen.  You have  trouble breathing or swallowing.  You feel tightness in the throat or chest.  You have abdominal pain. These problems may be the first sign of a life-threatening allergic reaction. Call your local emergency services (911 in [place] MAKE SURE YOU:   Understand these instructions.  Will watch your condition.  Will get help right away if you are not doing well or get worse. Document Released: 03/11/2005 Document Revised: 03/16/2013 Document Reviewed: 06/04/2011 ExitCare Patient Information 2015 [employer]. This information is not intended to replace advice given to you by your health care provider. Make sure you discuss any questions you have with your health care provider.  

## 2014-02-23 NOTE — ED Provider Notes (Signed)
CSN: [id number]     Arrival date & time 02/23/14  6433 History   First MD Initiated Contact with Patient 02/23/14 [phone]     Chief Complaint  Patient presents with  . Rash     (Consider location/radiation/quality/duration/timing/severity/associated sxs/prior Treatment) HPI Pt is a 48yo female who p/w diffuse itchy rash since Monday. Patient reports she traveled for the holiday and her son got a bug bite on his chin which is resolving but otherwise nobody else is having itching. She first noticed the bumps on her hands and arms and is now seeing them on her face, back and chest as well. She reports they are red and disappear and reappear. The ones on her arms are worse now because she has been scratching them. She has been using benadryl and topical cortisone and alcohol but none have helped. She does not recall any new food or environmental exposure.  Past Medical History  Diagnosis Date  . Hypertension   . Gall stone    Past Surgical History  Procedure Laterality Date  . Cesarean section    . Tubal ligation     Family History  Problem Relation Age of Onset  . Diabetes Mother   . Stroke Father   . Colon cancer Neg Hx   . Esophageal cancer Neg Hx   . Stomach cancer Neg Hx    History  Substance Use Topics  . Smoking status: Current Some Day Smoker -- 0.50 packs/day  . Smokeless tobacco: Never Used  . Alcohol Use: Yes     Comment: socially    OB History    No data available     Review of Systems  Constitutional: Negative for fever.  Respiratory: Negative for shortness of breath.   Cardiovascular: Negative for chest pain.  Musculoskeletal: Negative for arthralgias.  Skin: Positive for rash.  All other systems reviewed and are negative.     Allergies  Sulfa antibiotics  Home Medications   Prior to Admission medications   Medication Sig Start Date End Date Taking? Authorizing Provider  clobetasol ointment (TEMOVATE) 8.84 % Apply 1 application topically 2 (two)  times daily. For up to 2 weeks. 06/10/13   Leone Haven, MD  Cyanocobalamin (B-12) 1000 MCG CAPS Take 1 tablet by mouth daily.    Historical Provider, MD  diphenhydrAMINE (BENADRYL) 25 mg capsule Take 25 mg by mouth every 6 (six) hours as needed for itching or allergies.    Historical Provider, MD  hydrochlorothiazide (MICROZIDE) 12.5 MG capsule TAKE 2 CAPSULES (25 MG TOTAL) BY MOUTH EVERY MORNING. 12/20/13   Leone Haven, MD  ondansetron (ZOFRAN) 8 MG tablet Take 1 tablet (8 mg total) by mouth every 8 (eight) hours as needed for nausea or vomiting. 08/03/13   Richarda Blade, MD  pantoprazole (PROTONIX) 40 MG tablet Take 1 tablet (40 mg total) by mouth daily. 08/19/13   Leone Haven, MD  polyethylene glycol powder (GLYCOLAX/MIRALAX) powder Take 17 g by mouth daily as needed for moderate constipation. 06/10/13   Leone Haven, MD   BP 152/98 mmHg  Pulse 68  Temp(Src) 97.9 F (36.6 C) (Oral)  Resp 18  Ht 5\' 4"  (1.626 m)  Wt 218 lb (98.884 kg)  BMI 37.40 kg/m2  SpO2 96% Physical Exam  Constitutional: She is oriented to person, place, and time. She appears well-developed and well-nourished. No distress.  HENT:  Head: Normocephalic and atraumatic.  Eyes: Conjunctivae are normal. Right eye exhibits no discharge. Left eye exhibits no  discharge. No scleral icterus.  Cardiovascular: Normal rate, regular rhythm and normal heart sounds.   No murmur heard. Pulmonary/Chest: Effort normal and breath sounds normal. No respiratory distress. She has no wheezes.  Abdominal: Soft. Bowel sounds are normal. She exhibits no distension. There is no tenderness.  Neurological: She is alert and oriented to person, place, and time.  Skin: Skin is warm and intact. Rash noted. No petechiae noted. Rash is maculopapular and urticarial. She is not diaphoretic.  Smooth pink patches on back, areas of clumped papules on arms  Psychiatric: She has a normal mood and affect. Her behavior is normal.  Nursing  note and vitals reviewed.   ED Course  Procedures (including critical care time) Labs Review Labs Reviewed - No data to display  Imaging Review No results found.   EKG Interpretation None      MDM   Final diagnoses:  None   Urticarial rash with unknown allergen. Will treat with systemic steroids and recommend allergy testing if recurrent.   Frazier Richards, MD 02/23/14 [place], MD 02/23/14 [phone]

## 2014-02-28 ENCOUNTER — Other Ambulatory Visit: Payer: Self-pay | Admitting: Family Medicine

## 2014-04-10 ENCOUNTER — Encounter (HOSPITAL_BASED_OUTPATIENT_CLINIC_OR_DEPARTMENT_OTHER): Payer: Self-pay | Admitting: *Deleted

## 2014-04-10 ENCOUNTER — Emergency Department (HOSPITAL_BASED_OUTPATIENT_CLINIC_OR_DEPARTMENT_OTHER)
Admission: EM | Admit: 2014-04-10 | Discharge: 2014-04-10 | Disposition: A | Discharge status: Home / Self Care | Payer: 59 | Attending: Emergency Medicine | Admitting: Emergency Medicine

## 2014-04-10 DIAGNOSIS — Z8719 Personal history of other diseases of the digestive system: Secondary | ICD-10-CM | POA: Insufficient documentation

## 2014-04-10 DIAGNOSIS — Z79899 Other long term (current) drug therapy: Secondary | ICD-10-CM | POA: Diagnosis not present

## 2014-04-10 DIAGNOSIS — H9202 Otalgia, left ear: Secondary | ICD-10-CM | POA: Diagnosis present

## 2014-04-10 DIAGNOSIS — I10 Essential (primary) hypertension: Secondary | ICD-10-CM | POA: Insufficient documentation

## 2014-04-10 DIAGNOSIS — H6092 Unspecified otitis externa, left ear: Secondary | ICD-10-CM | POA: Diagnosis not present

## 2014-04-10 DIAGNOSIS — H6091 Unspecified otitis externa, right ear: Secondary | ICD-10-CM

## 2014-04-10 DIAGNOSIS — Z720 Tobacco use: Secondary | ICD-10-CM | POA: Diagnosis not present

## 2014-04-10 MED ORDER — TRAMADOL HCL 50 MG PO TABS: 50.0000 mg | ORAL_TABLET | Freq: Four times a day (QID) | ORAL | Status: DC | PRN

## 2014-04-10 MED ORDER — NEOMYCIN-POLYMYXIN-HC 3.5-10000-1 OT SUSP: 4.0000 [drp] | Freq: Three times a day (TID) | OTIC | Status: DC

## 2014-04-10 NOTE — ED Provider Notes (Signed)
CSN: [id number]     Arrival date & time 04/10/14  0908 History   First MD Initiated Contact with Patient 04/10/14 [phone]     Chief Complaint  Patient presents with  . Otalgia   Patient is a 49 y.o. female presenting with ear pain. The history is provided by the patient.  Otalgia Location:  Right Quality:  Aching Severity:  Moderate Duration:  1 day Timing:  Constant Chronicity:  New Context: not direct blow and not foreign body in ear   Relieved by:  Nothing Worsened by:  Position Ineffective treatments:  OTC medications Associated symptoms: no abdominal pain, no congestion, no cough, no ear discharge, no fever and no sore throat     Past Medical History  Diagnosis Date  . Hypertension   . Gall stone    Past Surgical History  Procedure Laterality Date  . Cesarean section    . Tubal ligation     Family History  Problem Relation Age of Onset  . Diabetes Mother   . Stroke Father   . Colon cancer Neg Hx   . Esophageal cancer Neg Hx   . Stomach cancer Neg Hx    History  Substance Use Topics  . Smoking status: Current Some Day Smoker -- 0.50 packs/day  . Smokeless tobacco: Never Used  . Alcohol Use: Yes     Comment: socially    OB History    No data available     Review of Systems  Constitutional: Negative for fever.  HENT: Positive for ear pain. Negative for congestion, ear discharge and sore throat.   Respiratory: Negative for cough.   Gastrointestinal: Negative for abdominal pain.  All other systems reviewed and are negative.     Allergies  Sulfa antibiotics  Home Medications   Prior to Admission medications   Medication Sig Start Date End Date Taking? Authorizing Provider  hydrochlorothiazide (MICROZIDE) 12.5 MG capsule TAKE 2 CAPSULES (25 MG TOTAL) BY MOUTH EVERY MORNING. 12/20/13   Leone Haven, MD  neomycin-polymyxin-hydrocortisone (CORTISPORIN) 3.5-10000-1 otic suspension Place 4 drops into the right ear 3 (three) times daily. 04/10/14   Dorie Rank, MD  pantoprazole (PROTONIX) 40 MG tablet TAKE 1 TABLET (40 MG TOTAL) BY MOUTH DAILY. 03/01/14   Leone Haven, MD  traMADol (ULTRAM) 50 MG tablet Take 1 tablet (50 mg total) by mouth every 6 (six) hours as needed. 04/10/14   Dorie Rank, MD   BP 139/89 mmHg  Pulse 77  Temp(Src) 98.4 F (36.9 C) (Oral)  Resp 18  Ht 5\' 4"  (1.626 m)  Wt 218 lb (98.884 kg)  BMI 37.40 kg/m2  SpO2 100% Physical Exam  Constitutional: She appears well-developed and well-nourished. No distress.  HENT:  Head: Normocephalic and atraumatic.  Right Ear: Tympanic membrane normal. There is tenderness. No foreign bodies. No mastoid tenderness. Tympanic membrane is not injected. No middle ear effusion.  Left Ear: Tympanic membrane and external ear normal.  Ears:  Mouth/Throat: No dental abscesses, uvula swelling or dental caries. No oropharyngeal exudate.  Eyes: Conjunctivae are normal. Right eye exhibits no discharge. Left eye exhibits no discharge. No scleral icterus.  Neck: Neck supple. No muscular tenderness present. No tracheal deviation and no edema present. No thyroid mass and no thyromegaly present.  Cardiovascular: Normal rate.   Pulmonary/Chest: Effort normal. No stridor. No respiratory distress.  Musculoskeletal: She exhibits no edema.  Lymphadenopathy:    She has no cervical adenopathy.  Neurological: She is alert. Cranial nerve deficit: no gross  deficits.  Skin: Skin is warm and dry. No rash noted.  Psychiatric: She has a normal mood and affect.  Nursing note and vitals reviewed.   ED Course  Procedures (including critical care time) Labs Review Labs Reviewed - No data to display  Imaging Review No results found.   EKG Interpretation None      MDM   Final diagnoses:  Otitis externa, right    Will treat for otitis externa.  TTP with manipulation of external ear.  Follow up with pcp    Dorie Rank, MD 04/10/14 [phone]

## 2014-04-10 NOTE — ED Notes (Signed)
Pt having right ear pain that radiates behind her ear as well. Started yesterday morning

## 2014-04-10 NOTE — Discharge Instructions (Signed)

## 2014-08-09 ENCOUNTER — Encounter: Payer: Self-pay | Admitting: Family Medicine

## 2014-08-09 ENCOUNTER — Telehealth: Payer: Self-pay | Admitting: Family Medicine

## 2014-08-09 ENCOUNTER — Ambulatory Visit (INDEPENDENT_AMBULATORY_CARE_PROVIDER_SITE_OTHER): Payer: 59 | Admitting: Family Medicine

## 2014-08-09 VITALS — BP 133/92 | HR 79 | Temp 98.2°F | Ht 64.0 in | Wt 222.0 lb

## 2014-08-09 DIAGNOSIS — J069 Acute upper respiratory infection, unspecified: Secondary | ICD-10-CM | POA: Insufficient documentation

## 2014-08-09 DIAGNOSIS — B9789 Other viral agents as the cause of diseases classified elsewhere: Principal | ICD-10-CM

## 2014-08-09 MED ORDER — BENZONATATE 100 MG PO CAPS: 100.0000 mg | ORAL_CAPSULE | Freq: Three times a day (TID) | ORAL | Status: DC | PRN

## 2014-08-09 MED ORDER — HYDROCODONE-HOMATROPINE 5-1.5 MG/5ML PO SYRP: 5.0000 mL | ORAL_SOLUTION | Freq: Three times a day (TID) | ORAL | Status: DC | PRN

## 2014-08-09 NOTE — Patient Instructions (Signed)
It was nice to see you today.  Your cough is secondary to a viral illness.   Use the medication as needed.  Follow up if you worsen or fail to improve.  Take care  Dr. Lacinda Axon

## 2014-08-09 NOTE — Assessment & Plan Note (Signed)
Supportive care. Tessalon and Hycodan for cough.

## 2014-08-09 NOTE — Telephone Encounter (Signed)
Patient is aware that the script was given to her while in clinic.  She has it and turned it in. [employer]

## 2014-08-09 NOTE — Telephone Encounter (Signed)
Pt was recently seen today. She was told that two medications were to be sent to the pharmacy, however they only have one. She would like to know when to expect the cough medicine that was supposed to be sent. Please follow up with the patient once it is completed. Thanks, [employer], ASA

## 2014-08-09 NOTE — Progress Notes (Signed)
   Subjective:    Patient ID: Lisa Sullivan, female    DOB: April 01, 1965, 49 y.o.   MRN: [id number]  HPI 49 year old female presents for same day appointment with complaints of cough and cold symptoms.  1) URI & Cough  Patient reports that she began having cold symptoms last Friday.  She states that she's been experiencing stuffy nose as well as runny nose. She's also been expressing chest congestion, itchy eyes, watery eyes.  Recently over the past few days she's been experiencing frequent dry cough.  She been taking Advil congestion, Benadryl and other over the counter remedies with no improvement in her symptoms.  No known exacerbating factors.  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  Review of Systems Per HPI    Objective:   Physical Exam Filed Vitals:   08/09/14 1605  BP: 133/92  Pulse: 79  Temp: 98.2 F (36.8 C)   Vital signs reviewed.  Exam: General: Appears fatigued, no acute distress. HEENT: NCAT. Normal TMs bilaterally. Oropharynx clear. Neck: Supple. No adenopathy. Cardiovascular: RRR. No murmurs, rubs, or gallops. Respiratory: CTAB. No rales, rhonchi, or wheeze.    Assessment & Plan:  See problem list.

## 2014-08-11 ENCOUNTER — Telehealth: Payer: Self-pay | Admitting: Family Medicine

## 2014-08-11 NOTE — Telephone Encounter (Signed)
Lisa Sullivan still not well enough to return to work today.  Need another work note for being absent today.  Please contact when ready for pickup.

## 2014-08-11 NOTE — Telephone Encounter (Signed)
Will forward to Dr Lacinda Axon since he saw the patient for this issue.

## 2014-08-13 NOTE — Telephone Encounter (Signed)
Please give the patient letter requested.

## 2014-08-15 ENCOUNTER — Encounter: Payer: Self-pay | Admitting: *Deleted

## 2014-08-15 NOTE — Telephone Encounter (Signed)
Tried to contact pt at home and also on cell. Did not LVM due to generic VM.  If pt returns call let her know that her note she requested will be upfront for her to pick up. Katharina Caper, April D, [place]

## 2014-09-25 ENCOUNTER — Other Ambulatory Visit: Payer: Self-pay | Admitting: Family Medicine

## 2014-10-31 ENCOUNTER — Emergency Department (HOSPITAL_COMMUNITY)
Admission: EM | Admit: 2014-10-31 | Discharge: 2014-10-31 | Disposition: A | Discharge status: Home / Self Care | Payer: 59 | Attending: Emergency Medicine | Admitting: Emergency Medicine

## 2014-10-31 ENCOUNTER — Encounter (HOSPITAL_COMMUNITY): Payer: Self-pay | Admitting: Emergency Medicine

## 2014-10-31 DIAGNOSIS — R0602 Shortness of breath: Secondary | ICD-10-CM

## 2014-10-31 DIAGNOSIS — T63441A Toxic effect of venom of bees, accidental (unintentional), initial encounter: Secondary | ICD-10-CM | POA: Insufficient documentation

## 2014-10-31 DIAGNOSIS — Y9389 Activity, other specified: Secondary | ICD-10-CM | POA: Diagnosis not present

## 2014-10-31 DIAGNOSIS — F419 Anxiety disorder, unspecified: Secondary | ICD-10-CM | POA: Insufficient documentation

## 2014-10-31 DIAGNOSIS — Z720 Tobacco use: Secondary | ICD-10-CM | POA: Diagnosis not present

## 2014-10-31 DIAGNOSIS — I10 Essential (primary) hypertension: Secondary | ICD-10-CM | POA: Insufficient documentation

## 2014-10-31 DIAGNOSIS — Y998 Other external cause status: Secondary | ICD-10-CM | POA: Diagnosis not present

## 2014-10-31 DIAGNOSIS — Z79899 Other long term (current) drug therapy: Secondary | ICD-10-CM | POA: Diagnosis not present

## 2014-10-31 DIAGNOSIS — Y9289 Other specified places as the place of occurrence of the external cause: Secondary | ICD-10-CM | POA: Insufficient documentation

## 2014-10-31 DIAGNOSIS — Z91030 Bee allergy status: Secondary | ICD-10-CM

## 2014-10-31 MED ORDER — METHYLPREDNISOLONE 4 MG PO TBPK: ORAL_TABLET | ORAL | Status: DC

## 2014-10-31 MED ORDER — FAMOTIDINE IN NACL 20-0.9 MG/50ML-% IV SOLN
20.0000 mg | Freq: Once | INTRAVENOUS | Status: AC
  Administered 2014-10-31: 20 mg via INTRAVENOUS
  Filled 2014-10-31: qty 50

## 2014-10-31 MED ORDER — METHYLPREDNISOLONE SODIUM SUCC 125 MG IJ SOLR
125.0000 mg | Freq: Once | INTRAMUSCULAR | Status: AC
  Administered 2014-10-31: 125 mg via INTRAVENOUS
  Filled 2014-10-31: qty 2

## 2014-10-31 MED ORDER — FAMOTIDINE 40 MG PO TABS: 40.0000 mg | ORAL_TABLET | Freq: Every day | ORAL | Status: DC

## 2014-10-31 MED ORDER — SODIUM CHLORIDE 0.9 % IV SOLN
1000.0000 mL | Freq: Once | INTRAVENOUS | Status: AC
  Administered 2014-10-31: 1000 mL via INTRAVENOUS

## 2014-10-31 MED ORDER — DIPHENHYDRAMINE HCL 25 MG PO CAPS
25.0000 mg | ORAL_CAPSULE | Freq: Once | ORAL | Status: DC
  Filled 2014-10-31: qty 1

## 2014-10-31 MED ORDER — DIPHENHYDRAMINE HCL 25 MG PO TABS: 25.0000 mg | ORAL_TABLET | Freq: Four times a day (QID) | ORAL | Status: DC | PRN

## 2014-10-31 MED ORDER — EPINEPHRINE 0.3 MG/0.3ML IJ SOAJ
0.3000 mg | Freq: Once | INTRAMUSCULAR | Status: AC
  Administered 2014-10-31: 0.3 mg via INTRAMUSCULAR

## 2014-10-31 MED ORDER — ONDANSETRON HCL 4 MG/2ML IJ SOLN
4.0000 mg | Freq: Once | INTRAMUSCULAR | Status: AC
  Administered 2014-10-31: 4 mg via INTRAVENOUS
  Filled 2014-10-31: qty 2

## 2014-10-31 MED ORDER — DIPHENHYDRAMINE HCL 50 MG/ML IJ SOLN
25.0000 mg | Freq: Once | INTRAMUSCULAR | Status: AC
  Administered 2014-10-31: 25 mg via INTRAVENOUS

## 2014-10-31 MED ORDER — EPINEPHRINE 0.3 MG/0.3ML IJ SOAJ: 0.3000 mg | Freq: Once | INTRAMUSCULAR | Status: DC

## 2014-10-31 MED ORDER — EPINEPHRINE 0.3 MG/0.3ML IJ SOAJ
INTRAMUSCULAR | Status: AC
  Filled 2014-10-31: qty 0.3

## 2014-10-31 NOTE — ED Provider Notes (Signed)
CSN: [id number]     Arrival date & time 10/31/14  1005 History   First MD Initiated Contact with Patient 10/31/14 1011     Chief Complaint  Patient presents with  . Allergic Reaction     (Consider location/radiation/quality/duration/timing/severity/associated sxs/prior Treatment) HPI Comments: Patient states that she was stung by a bee approximately 10 minutes prior to arrival. Currently states she is having shortness of breath and pain at the sting site. Patient denies any nausea or vomiting or abdominal pain. She denies any sensation that her tongue or throat swelling currently. Patient did not have epinephrine at home.  Patient is a 49 y.o. female presenting with allergic reaction. The history is provided by the patient. No language interpreter was used.  Allergic Reaction Presenting symptoms: difficulty breathing and rash (R arm redness where bee sting was)   Presenting symptoms: no itching and no wheezing   Difficulty breathing:    Severity:  Moderate   Onset quality:  Sudden   Duration:  1 hour   Timing:  Constant   Progression:  Unchanged Severity:  Unable to specify Prior allergic episodes:  Insect allergies (bee stings, states last bee sting was in 1970's and got epi then, none since. ) Context: insect bite/sting (bee sting)   Relieved by:  None tried Worsened by:  Nothing tried Ineffective treatments:  None tried   Past Medical History  Diagnosis Date  . Hypertension   . Gall stone    Past Surgical History  Procedure Laterality Date  . Cesarean section    . Tubal ligation     Family History  Problem Relation Age of Onset  . Diabetes Mother   . Stroke Father   . Colon cancer Neg Hx   . Esophageal cancer Neg Hx   . Stomach cancer Neg Hx    History  Substance Use Topics  . Smoking status: Current Some Day Smoker -- 0.50 packs/day  . Smokeless tobacco: Never Used  . Alcohol Use: Yes     Comment: socially    OB History    No data available     Review of  Systems  Constitutional: Negative for fever and chills.  HENT: Negative for congestion and facial swelling.   Eyes: Negative for photophobia and visual disturbance.  Respiratory: Positive for shortness of breath. Negative for wheezing.   Cardiovascular: Negative for chest pain.  Gastrointestinal: Negative for nausea, vomiting and abdominal pain.  Genitourinary: Negative for dysuria and hematuria.  Skin: Positive for rash (R arm redness where bee sting was). Negative for itching.  Neurological: Positive for light-headedness. Negative for syncope.  Psychiatric/Behavioral: Negative for confusion. The patient is nervous/anxious.   All other systems reviewed and are negative.     Allergies  Sulfa antibiotics  Home Medications   Prior to Admission medications   Medication Sig Start Date End Date Taking? Authorizing Provider  benzonatate (TESSALON) 100 MG capsule Take 1 capsule (100 mg total) by mouth 3 (three) times daily as needed for cough. 08/09/14   Coral Spikes, DO  diphenhydrAMINE (BENADRYL) 25 MG tablet Take 1 tablet (25 mg total) by mouth every 6 (six) hours as needed. 10/31/14   Theodosia Quay, MD  EPINEPHrine 0.3 mg/0.3 mL IJ SOAJ injection Inject 0.3 mLs (0.3 mg total) into the muscle once. 10/31/14   Theodosia Quay, MD  famotidine (PEPCID) 40 MG tablet Take 1 tablet (40 mg total) by mouth daily. 10/31/14   Theodosia Quay, MD  hydrochlorothiazide (MICROZIDE) 12.5 MG capsule TAKE 2  CAPSULES (25 MG TOTAL) BY MOUTH EVERY MORNING. 12/20/13   Leone Haven, MD  HYDROcodone-homatropine [facility]) 5-1.5 MG/5ML syrup Take 5 mLs by mouth every 8 (eight) hours as needed for cough. 08/09/14   Coral Spikes, DO  methylPREDNISolone (MEDROL DOSEPAK) 4 MG TBPK tablet Follow package instructions 10/31/14   Theodosia Quay, MD  neomycin-polymyxin-hydrocortisone (CORTISPORIN) 3.5-10000-1 otic suspension Place 4 drops into the right ear 3 (three) times daily. 04/10/14   Dorie Rank, MD  pantoprazole (PROTONIX) 40 MG  tablet TAKE 1 TABLET BY MOUTH EVERY DAY 09/26/14   Vivi Barrack, MD  traMADol (ULTRAM) 50 MG tablet Take 1 tablet (50 mg total) by mouth every 6 (six) hours as needed. 04/10/14   Dorie Rank, MD   BP 144/85 mmHg  Pulse 77  Temp(Src) 97.8 F (36.6 C) (Oral)  Resp 16  Ht 5\' 4"  (1.626 m)  Wt 221 lb (100.245 kg)  BMI 37.92 kg/m2  SpO2 96%  LMP 05/31/2014 (Approximate) Physical Exam  Constitutional: She is oriented to person, place, and time.  HENT:  Head: Normocephalic and atraumatic.  Mouth/Throat: Oropharynx is clear and moist. No posterior oropharyngeal edema.  Eyes: Conjunctivae and EOM are normal. Pupils are equal, round, and reactive to light.  Pulmonary/Chest: She is in respiratory distress. She has no wheezes. She has no rales.  Abdominal: Soft. Bowel sounds are normal. There is no tenderness.  Musculoskeletal: Normal range of motion. She exhibits no tenderness.  Neurological: She is alert and oriented to person, place, and time.  Skin: Skin is warm and dry. There is erythema (area of erythema noted to L forearm).  Psychiatric: Her speech is normal and behavior is normal. Her mood appears anxious.  Vitals reviewed.   ED Course  Procedures (including critical care time) Labs Review Labs Reviewed - No data to display  Imaging Review No results found.   EKG Interpretation None      MDM   Final diagnoses:  Bee sting reaction, accidental or unintentional, initial encounter  History of bee sting allergy  Shortness of breath    49 year old female with past medical history of hypertension presents today status post bee sting occurred approximately 10 minutes prior to arrival. Patient was stung on her left arm. Patient states she has a known allergy to bee stings and did not have her EpiPen at home. On exam patient is awake and alert but tearful and in obvious distress. Lungs were clear to auscultation bilaterally. O2 saturations were within normal limits. No findings to be  consistent with edema in the mouth. Given patient's prior history of allergies and her obvious distress did give her an EpiPen dose of epinephrine. Additionally patient was given Benadryl, Pepcid, steroids through her IV.  Following this patient remained stable. No further acute events occurred. Patient was later sleeping soundly in no acute distress. After 4 hours of observation discharge the patient home with an EpiPen and instructions for Benadryl and Pepcid as well. Also patient was given a Medrol Dosepak as well. Patient was stable at the time of discharge.    Theodosia Quay, MD 10/31/14 1546  Theodosia Quay, MD 10/31/14 1547  Dorie Rank, MD 10/31/14 1549

## 2014-10-31 NOTE — Discharge Instructions (Signed)
Anaphylactic Reaction °An anaphylactic reaction is a sudden, severe allergic reaction. It affects the whole body. It can be life threatening. You may need to stay in the hospital.  °HOME CARE °· Wear a medical bracelet or necklace that lists your allergy. °· Carry your allergy kit or medicine shot to treat severe allergic reactions with you. These can save your life. °· Do not drive until medicine from your shot has worn off, unless your doctor says it is okay. °· If you have hives or a rash: °¨ Take medicine as told by your doctor. °¨ You may take over-the-counter antihistamine medicine. °¨ Place cold cloths on your skin. Take baths in cool water. Avoid hot baths and hot showers. °GET HELP RIGHT AWAY IF:  °· Your mouth is puffy (swollen), or you have trouble breathing. °· You start making whistling sounds when you breathe (wheezing). °· You have a tight feeling in your chest or throat. °· You have a rash, hives, puffiness, or itching on your body. °· You throw up (vomit) or have watery poop (diarrhea). °· You feel dizzy or pass out (faint). °· You think you are having an allergic reaction. °· You have new symptoms. °This is an emergency. Use your medicine shot or allergy kit as told. Call your local emergency services (911 in [place] Even if you feel better after the shot, you need to go to the hospital emergency department. °MAKE SURE YOU:  °· Understand these instructions. °· Will watch your condition. °· Will get help right away if you are not doing well or get worse. °Document Released: 08/28/2007 Document Revised: 09/10/2011 Document Reviewed: 06/12/2011 °ExitCare® Patient Information ©2015 [employer]. This information is not intended to replace advice given to you by your health care provider. Make sure you discuss any questions you have with your health care provider. ° °

## 2014-10-31 NOTE — ED Provider Notes (Signed)
Patient presents to the emergency department for evaluation after a bee sting 10 minutes ago. She has history of allergic reaction although has not had a bee sting and a number of years. Patient complains of shortness of breath. Physical Exam  BP 136/81 mmHg  Pulse 71  Temp(Src) 98 F (36.7 C) (Oral)  Resp 18  Ht 5\' 4"  (1.626 m)  Wt 221 lb (100.245 kg)  BMI 37.92 kg/m2  SpO2 100%  LMP 05/31/2014 (Approximate)  Physical Exam  Constitutional: She appears well-developed and well-nourished. No distress.  HENT:  Head: Normocephalic and atraumatic.  Right Ear: External ear normal.  Left Ear: External ear normal.  No oropharyngeal swelling  Eyes: Conjunctivae are normal. Right eye exhibits no discharge. Left eye exhibits no discharge. No scleral icterus.  Neck: Neck supple. No tracheal deviation present.  Cardiovascular: Normal rate.   Pulmonary/Chest: Effort normal. No stridor. No respiratory distress. She has no wheezes. She has no rales.  Musculoskeletal: She exhibits no edema.  Neurological: She is alert. Cranial nerve deficit: no gross deficits.  Skin: Skin is warm and dry. No rash noted.  Psychiatric: She has a normal mood and affect.  Nursing note and vitals reviewed.   ED Course  Procedures  MDM Patient does not have any obvious evidence of anaphylaxis.  No wheezing, no hypertension, no airway edema, no urticaria. Steroid-induced, and histamines, and epinephrine been ordered. We will continue to monitor in the emergency department.     Dorie Rank, MD 10/31/14 1056

## 2014-10-31 NOTE — ED Notes (Signed)
PT stung by bee in left forearm 10 mins ago; wheezing and having SOB; room air sats 100%.

## 2014-11-29 ENCOUNTER — Other Ambulatory Visit: Payer: Self-pay | Admitting: *Deleted

## 2014-11-30 MED ORDER — HYDROCHLOROTHIAZIDE 12.5 MG PO CAPS: ORAL_CAPSULE | ORAL | Status: DC

## 2014-11-30 NOTE — Telephone Encounter (Signed)
Refilled for 1 month. Patient needs to come to the office for visit for further refills.  Lisa Sullivan. [name], [place] Resident PGY-2 11/30/2014 8:39 AM

## 2014-11-30 NOTE — Telephone Encounter (Signed)
Patient is aware and appt scheduled for 12-15-14. Bless Lisenby,CMA

## 2014-12-15 ENCOUNTER — Ambulatory Visit (INDEPENDENT_AMBULATORY_CARE_PROVIDER_SITE_OTHER): Payer: 59 | Admitting: Family Medicine

## 2014-12-15 ENCOUNTER — Encounter: Payer: Self-pay | Admitting: Family Medicine

## 2014-12-15 ENCOUNTER — Telehealth: Payer: Self-pay | Admitting: *Deleted

## 2014-12-15 VITALS — BP 141/89 | HR 89 | Temp 98.4°F | Ht 64.0 in | Wt 222.2 lb

## 2014-12-15 DIAGNOSIS — L301 Dyshidrosis [pompholyx]: Secondary | ICD-10-CM

## 2014-12-15 DIAGNOSIS — R21 Rash and other nonspecific skin eruption: Secondary | ICD-10-CM

## 2014-12-15 DIAGNOSIS — I10 Essential (primary) hypertension: Secondary | ICD-10-CM | POA: Diagnosis not present

## 2014-12-15 DIAGNOSIS — Z1239 Encounter for other screening for malignant neoplasm of breast: Secondary | ICD-10-CM | POA: Diagnosis not present

## 2014-12-15 DIAGNOSIS — R51 Headache: Secondary | ICD-10-CM

## 2014-12-15 DIAGNOSIS — Z23 Encounter for immunization: Secondary | ICD-10-CM | POA: Diagnosis not present

## 2014-12-15 DIAGNOSIS — Z79899 Other long term (current) drug therapy: Secondary | ICD-10-CM

## 2014-12-15 DIAGNOSIS — R519 Headache, unspecified: Secondary | ICD-10-CM | POA: Insufficient documentation

## 2014-12-15 DIAGNOSIS — Z0000 Encounter for general adult medical examination without abnormal findings: Secondary | ICD-10-CM

## 2014-12-15 LAB — CBC
HCT: 41 % (ref 36.0–46.0)
HEMOGLOBIN: 13 g/dL (ref 12.0–15.0)
MCH: 27 pg (ref 26.0–34.0)
MCHC: 31.7 g/dL (ref 30.0–36.0)
MCV: 85.1 fL (ref 78.0–100.0)
MPV: 10.9 fL (ref 8.6–12.4)
PLATELETS: 263 10*3/uL (ref 150–400)
RBC: 4.82 MIL/uL (ref 3.87–5.11)
RDW: 14.9 % (ref 11.5–15.5)
WBC: 6.5 10*3/uL (ref 4.0–10.5)

## 2014-12-15 LAB — LIPID PANEL
Cholesterol: 197 mg/dL (ref 125–200)
HDL: 49 mg/dL (ref >=46)
LDL CALC: 122 mg/dL (ref <130)
TRIGLYCERIDES: 130 mg/dL (ref <150)
Total CHOL/HDL Ratio: 4 Ratio (ref <=5.0)
VLDL: 26 mg/dL (ref <30)

## 2014-12-15 LAB — BASIC METABOLIC PANEL WITH GFR
BUN: 17 mg/dL (ref 7–25)
CHLORIDE: 104 mmol/L (ref 98–110)
CO2: 25 mmol/L (ref 20–31)
Calcium: 9.1 mg/dL (ref 8.6–10.4)
Creat: 0.84 mg/dL (ref 0.50–1.05)
GFR, EST NON AFRICAN AMERICAN: 81 mL/min (ref >=60)
GFR, Est African American: >89 mL/min (ref >=60)
Glucose, Bld: 92 mg/dL (ref 65–99)
POTASSIUM: 4.4 mmol/L (ref 3.5–5.3)
SODIUM: 139 mmol/L (ref 135–146)

## 2014-12-15 LAB — POCT GLYCOSYLATED HEMOGLOBIN (HGB A1C): Hemoglobin A1C: 6.3

## 2014-12-15 MED ORDER — LEVOCETIRIZINE DIHYDROCHLORIDE 5 MG PO TABS: 5.0000 mg | ORAL_TABLET | Freq: Every evening | ORAL | Status: DC

## 2014-12-15 MED ORDER — HYDROCORTISONE 1 % RE CREA: TOPICAL_CREAM | RECTAL | Status: DC

## 2014-12-15 MED ORDER — HYDROCORTISONE 2.5 % EX OINT: TOPICAL_OINTMENT | Freq: Two times a day (BID) | CUTANEOUS | Status: DC

## 2014-12-15 NOTE — Assessment & Plan Note (Signed)
Mammogram ordered today. Instructed patient to return to clinic soon for pap test.

## 2014-12-15 NOTE — Progress Notes (Signed)
    Subjective:  Lisa Sullivan is a 49 y.o. female who presents to the [facility] today with a chief complaint of hand rash.   HPI:  Hand Pain Patient with longstanding history of intermittent rash on her hands. Was previously referred to Dermatology, though reports that she had no official diagnosis. Rash is very itchy when it breaks out. States that she tries vaseline, which helps some. No new soaps or detergents. Has tried benadryl in the past which helps with the itching.   Headaches Patient with new onset headaches. States that the started last week. Has been having headaches every day that last all day. Pain is located primarily in the posterior aspect of her head. Also has noted blurry vision. No weakness, though states that her hands are occasionally numb. Has tried taking alleve and motrin in the past with some relief. Endorses photophobia and phonophobia. Has been taking analgesic everyday.   Hypertension BP Readings from Last 3 Encounters:  12/15/14 141/89  10/31/14 144/85  08/09/14 133/92   Home BP monitoring-No Compliant with medications-yes, without side effects ROS-Denies any CP, HA, SOB, blurry vision, LE edema, transient weakness, orthopnea, PND.   Healthcare Maintenance Due for mammogram and pap smear.  ROS: Per HPI  PMH:  The following were reviewed and entered/updated in epic: Past Medical History  Diagnosis Date  . Hypertension   . Gall stone    Patient Active Problem List   Diagnosis Date Noted  . Headache 12/15/2014  . Healthcare maintenance 12/15/2014  . Unspecified constipation 06/10/2013  . Hypertension 02/28/2011  . Goiter 02/28/2011  . OBESITY, UNSPECIFIED 03/27/2010  . SMOKER 03/27/2010  . 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 Cesarean section    . Tubal ligation       Objective:  Physical Exam: BP 141/89 mmHg  Pulse 89  Temp(Src) 98.4 F (36.9 C) (Oral)  Ht 5\' 4"  (1.626 m)  Wt 222  lb 3 oz (100.784 kg)  BMI 38.12 kg/m2  LMP 11/11/2014  Gen: NAD, resting comfortably CV: RRR with no murmurs appreciated Lungs: NWOB, CTAB with no crackles, wheezes, or rhonchi GI: Normal bowel sounds present. Soft, Nontender, Nondistended. MSK:  - Hands: Bilateral palmar rash. Discrete papular lesions with exoriations noted. Nonerythematous. Located only on palms.  Skin: warm, dry, rash as noted above Neuro: CN2-12 intact. Strength 5/5 in upper and lower extremities. Sensation to light touch intact throughout.  Psych: Normal affect and thought content   Assessment/Plan:  DYSHIDROTIC ECZEMA, HANDS Rash on hands most consistent with dyshidrotic eczema. Will treat with hydrocortisone cream. Will treat pruritis with daily Xyzal (Zyrtec not on formulary for patient). Will refer to dermatology for further management (patient has previously seen dermatology in the past for the same rash).  Headache No red flag signs or symptoms. Neuro exam benign today. Possible tension-type headache, though unusual to present at this age. Can also consider medication overuse headaches given daily use of analgesics. Does not seem to be migraine type headache. Will refer to headache specialist for further management.   Hypertension Well controlled today. Will continue HCTZ.   Healthcare maintenance Mammogram ordered today. Instructed patient to return to clinic soon for pap test.     Algis Greenhouse. [name], [place] Resident PGY-2 12/15/2014 12:30 PM

## 2014-12-15 NOTE — Assessment & Plan Note (Signed)
No red flag signs or symptoms. Neuro exam benign today. Possible tension-type headache, though unusual to present at this age. Can also consider medication overuse headaches given daily use of analgesics. Does not seem to be migraine type headache. Will refer to headache specialist for further management.

## 2014-12-15 NOTE — Assessment & Plan Note (Signed)
Well controlled today. Will continue HCTZ.

## 2014-12-15 NOTE — Telephone Encounter (Signed)
Sent in formulary alternative.  Algis Greenhouse. [name], [facility] Resident PGY-2 12/15/2014 3:33 PM

## 2014-12-15 NOTE — Patient Instructions (Addendum)
Thank you for coming to the clinic today. It was nice seeing you.  For your rash, we will send in a prescription for a steroid cream. We will also start you on a medication called Xyxal to help with the itching. We will also place a refferal to dermatology.   For your headaches, you may be having what it called rebound headaches. We will refer you to a headache specialist.  Your blood pressure looks good today. Please continue taking the HCTZ.  We will be checking blood work today. You will receive a call or a letter in the mail with the results.  We will set you up to have a mammogram. You are also due for a pap smear, please come back at your earliest convenience to have this done.  Please see me again in 3-6 months, or sooner if you need anything else.  Take care,  Dr [name]

## 2014-12-15 NOTE — Assessment & Plan Note (Signed)
Rash on hands most consistent with dyshidrotic eczema. Will treat with hydrocortisone cream. Will treat pruritis with daily Xyzal (Zyrtec not on formulary for patient). Will refer to dermatology for further management (patient has previously seen dermatology in the past for the same rash).

## 2014-12-15 NOTE — Telephone Encounter (Signed)
Prior Authorization received from [employer] for Proctocort 1% cream.  PA form placed in provider box for completion. Derl Barrow, RN

## 2014-12-16 ENCOUNTER — Telehealth: Payer: Self-pay | Admitting: Family Medicine

## 2014-12-16 ENCOUNTER — Encounter: Payer: Self-pay | Admitting: Family Medicine

## 2014-12-16 NOTE — Telephone Encounter (Signed)
Pam from [facility] advises me the patient has the incorrect PCP on her [place] insurance card. Cannot schedule until this is corrected. Called and LMOVM for patient to call [facility] and change her card to [facility] or to one of our attending physicians. Awaiting a return call before processing the referral along with dermatology referral.

## 2014-12-23 ENCOUNTER — Encounter (HOSPITAL_COMMUNITY): Payer: Self-pay | Admitting: Family Medicine

## 2014-12-23 ENCOUNTER — Emergency Department (INDEPENDENT_AMBULATORY_CARE_PROVIDER_SITE_OTHER)
Admission: EM | Admit: 2014-12-23 | Discharge: 2014-12-23 | Disposition: A | Discharge status: Home / Self Care | Payer: 59 | Source: Home / Self Care | Attending: Family Medicine | Admitting: Family Medicine

## 2014-12-23 DIAGNOSIS — R21 Rash and other nonspecific skin eruption: Secondary | ICD-10-CM

## 2014-12-23 DIAGNOSIS — L309 Dermatitis, unspecified: Secondary | ICD-10-CM

## 2014-12-23 MED ORDER — TRIAMCINOLONE ACETONIDE 0.5 % EX OINT: 1.0000 "application " | TOPICAL_OINTMENT | Freq: Two times a day (BID) | CUTANEOUS | Status: DC

## 2014-12-23 MED ORDER — PREDNISONE 10 MG (48) PO TBPK: ORAL_TABLET | Freq: Every day | ORAL | Status: DC

## 2014-12-23 MED ORDER — PERMETHRIN 5 % EX CREA: TOPICAL_CREAM | CUTANEOUS | Status: DC

## 2014-12-23 NOTE — Discharge Instructions (Signed)
Please start the steroid pills. This should significantly improve your rash. If for some reason the rash persists then he will need to use the permethrin cream to treat a possible underlying infection with scabies. Please use the triamcinolone ointment in combination with Cerave, which can be purchased from [place] or [employer]. Please put the entire tube of triamcinolone into the top ofCerave and then use that cream 2-3 times daily where you have rash. Please follow-up with your primary care physician or dermatology as directed for further treatment.

## 2014-12-23 NOTE — ED Notes (Signed)
The patient presented to the [facility] with a complaint of a rash and possible allergic reaction. The patient stated that the rash started about 2 days ago and she thinks that it may have been to a concentrated bleach solution. The rash was present on both arms.

## 2014-12-23 NOTE — ED Provider Notes (Signed)
CSN: [id number]     Arrival date & time 12/23/14  1635 History   First MD Initiated Contact with Patient 12/23/14 1704     Chief Complaint  Patient presents with  . Rash  . Allergic Reaction   (Consider location/radiation/quality/duration/timing/severity/associated sxs/prior Treatment) HPI  Rash on hands. Present for years. Intermittent. Patient seen on 12/15/2014 for treatment. Patient told to use hydrocortisone 1% cream and size all. Patient has tried this without any relief. Patient was also supposed to follow-up with dermatology but does not yet have her appointment. She is wearing for call back. Problem is getting worse per patient. Some areas of cracked skin on her hands.   Rash on arms. Started approximately 2-3 days ago. Patient states that she changed her detergent also start using bleach in her laundry detergent. Itchy. Patient has had times recently where she has been in her homes or had other people her home. Rash is located primarily on arms but is extending more proximally now. Denies any fevers, nausea, vomiting, chest pain, shortness of breath, joint effusions.    Past Medical History  Diagnosis Date  . Hypertension   . Gall stone    Past Surgical History  Procedure Laterality Date  . Cesarean section    . Tubal ligation     Family History  Problem Relation Age of Onset  . Diabetes Mother   . Stroke Father   . Colon cancer Neg Hx   . Esophageal cancer Neg Hx   . Stomach cancer Neg Hx    Social History  Substance Use Topics  . Smoking status: Current Some Day Smoker -- 0.50 packs/day  . Smokeless tobacco: Never Used  . Alcohol Use: Yes     Comment: socially    OB History    No data available     Review of Systems Per HPI with all other pertinent systems negative.   Allergies  Bee venom; Latex; and Sulfa antibiotics  Home Medications   Prior to Admission medications   Medication Sig Start Date End Date Taking? Authorizing Provider  benzonatate  (TESSALON) 100 MG capsule Take 1 capsule (100 mg total) by mouth 3 (three) times daily as needed for cough. 08/09/14   Coral Spikes, DO  diphenhydrAMINE (BENADRYL) 25 MG tablet Take 1 tablet (25 mg total) by mouth every 6 (six) hours as needed. 10/31/14   Theodosia Quay, MD  EPINEPHrine 0.3 mg/0.3 mL IJ SOAJ injection Inject 0.3 mLs (0.3 mg total) into the muscle once. 10/31/14   Theodosia Quay, MD  famotidine (PEPCID) 40 MG tablet Take 1 tablet (40 mg total) by mouth daily. 10/31/14   Theodosia Quay, MD  hydrochlorothiazide (MICROZIDE) 12.5 MG capsule TAKE 2 CAPSULES (25 MG TOTAL) BY MOUTH EVERY MORNING. 11/30/14   Vivi Barrack, MD  hydrocortisone (PROCTOCORT) 1 % CREA Apply thin film to area 3-4 times daily. 12/15/14   Vivi Barrack, MD  hydrocortisone 2.5 % ointment Apply topically 2 (two) times daily. 12/15/14   Vivi Barrack, MD  levocetirizine (XYZAL) 5 MG tablet Take 1 tablet (5 mg total) by mouth every evening. 12/15/14   Vivi Barrack, MD  neomycin-polymyxin-hydrocortisone (CORTISPORIN) 3.5-10000-1 otic suspension Place 4 drops into the right ear 3 (three) times daily. 04/10/14   Dorie Rank, MD  pantoprazole (PROTONIX) 40 MG tablet TAKE 1 TABLET BY MOUTH EVERY DAY 09/26/14   Vivi Barrack, MD  permethrin (ELIMITE) 5 % cream Apply topically to entire body and leave on overnight. Wash off  in the morning and reapply in 14 days 12/23/14   Waldemar Dickens, MD  predniSONE (STERAPRED UNI-PAK 48 TAB) 10 MG (48) TBPK tablet Take by mouth daily. Take as instructed 12/23/14   Waldemar Dickens, MD  traMADol (ULTRAM) 50 MG tablet Take 1 tablet (50 mg total) by mouth every 6 (six) hours as needed. 04/10/14   Dorie Rank, MD  triamcinolone ointment (KENALOG) 0.5 % Apply 1 application topically 2 (two) times daily. Treat for 5-7 days then stop for 7 days 12/23/14   Waldemar Dickens, MD   Meds Ordered and Administered this Visit  Medications - No data to display  BP 145/88 mmHg  Pulse 74  Temp(Src) 98.1 F (36.7 C) (Oral)   Resp 16  SpO2 99%  LMP 11/11/2014 No data found.   Physical Exam Physical Exam  Constitutional: oriented to person, place, and time. appears well-developed and well-nourished. No distress.  HENT:  Head: Normocephalic and atraumatic.  Eyes: EOMI. PERRL.  Neck: Normal range of motion.  Cardiovascular: RRR, no m/r/g, 2+ distal pulses,  Pulmonary/Chest: Effort normal and breath sounds normal. No respiratory distress.  Abdominal: Soft. Bowel sounds are normal. NonTTP, no distension.  Musculoskeletal: Normal range of motion. Non ttp, no effusion.  Neurological: alert and oriented to person, place, and time.  Skin: Thick scaling peels of the hands per lambert palmar surface, few areas of cracked skin in the creases. Arms covered with small papular rash. Areas of excoriation.Marland Kitchen  Psychiatric: normal mood and affect. behavior is normal. Judgment and thought content normal.   ED Course  Procedures (including critical care time)  Labs Review Labs Reviewed - No data to display  Imaging Review No results found.   Visual Acuity Review  Right Eye Distance:   Left Eye Distance:   Bilateral Distance:    Right Eye Near:   Left Eye Near:    Bilateral Near:         MDM   1. Rash   2. Eczema    Dyshidrotic eczema. Not improving with over-the-counter steroid cream and Xyzal. Patient likely needs a more potent therapy. Have recommended patient use a combination treatment of triamcinolone 0.5% ointment mixed with OTC Cerave cream. Patient to continue pursuing follow-up with dermatology.  Rash over patient's arms looks very similar to scabies but story seems to fall in line with more of a contact dermatitis secondary to changes in laundry detergent. Patient will be given a total of a steroid Dosepak but if this is not improving patient is then to use permethrin cream. Follow-up with primary care physician if needed.    Waldemar Dickens, MD 12/23/14 [phone]

## 2015-01-13 ENCOUNTER — Other Ambulatory Visit: Payer: Self-pay | Admitting: Internal Medicine

## 2015-01-13 NOTE — Telephone Encounter (Signed)
Patient has an appt on 01/23/2015. Jazmin Hartsell,CMA

## 2015-01-13 NOTE — Telephone Encounter (Signed)
Will not refill prednisone at this time. Patient was only prescribed a short course of steroids. If she is still having issues, she needs to be seen in clinic.   Algis Greenhouse. [name], [place] Resident PGY-2 01/13/2015 11:06 AM

## 2015-01-23 ENCOUNTER — Ambulatory Visit (INDEPENDENT_AMBULATORY_CARE_PROVIDER_SITE_OTHER): Payer: 59 | Admitting: Family Medicine

## 2015-01-23 ENCOUNTER — Other Ambulatory Visit (HOSPITAL_COMMUNITY)
Admission: RE | Admit: 2015-01-23 | Discharge: 2015-01-23 | Disposition: A | Discharge status: Home / Self Care | Payer: 59 | Source: Ambulatory Visit | Attending: Family Medicine | Admitting: Family Medicine

## 2015-01-23 ENCOUNTER — Encounter: Payer: Self-pay | Admitting: Family Medicine

## 2015-01-23 VITALS — BP 126/76 | HR 89 | Temp 98.2°F | Ht 64.0 in | Wt 224.4 lb

## 2015-01-23 DIAGNOSIS — Z01419 Encounter for gynecological examination (general) (routine) without abnormal findings: Secondary | ICD-10-CM

## 2015-01-23 DIAGNOSIS — F329 Major depressive disorder, single episode, unspecified: Secondary | ICD-10-CM

## 2015-01-23 DIAGNOSIS — L301 Dyshidrosis [pompholyx]: Secondary | ICD-10-CM | POA: Diagnosis not present

## 2015-01-23 DIAGNOSIS — Z0000 Encounter for general adult medical examination without abnormal findings: Secondary | ICD-10-CM

## 2015-01-23 DIAGNOSIS — Z1151 Encounter for screening for human papillomavirus (HPV): Secondary | ICD-10-CM | POA: Diagnosis present

## 2015-01-23 DIAGNOSIS — R5383 Other fatigue: Secondary | ICD-10-CM | POA: Diagnosis not present

## 2015-01-23 DIAGNOSIS — E049 Nontoxic goiter, unspecified: Secondary | ICD-10-CM

## 2015-01-23 DIAGNOSIS — F32A Depression, unspecified: Secondary | ICD-10-CM | POA: Insufficient documentation

## 2015-01-23 LAB — TSH: TSH: 2.789 u[IU]/mL (ref 0.350–4.500)

## 2015-01-23 MED ORDER — CITALOPRAM HYDROBROMIDE 20 MG PO TABS: 20.0000 mg | ORAL_TABLET | Freq: Every day | ORAL | Status: DC

## 2015-01-23 MED ORDER — PANTOPRAZOLE SODIUM 40 MG PO TBEC: 40.0000 mg | DELAYED_RELEASE_TABLET | Freq: Every day | ORAL | Status: DC

## 2015-01-23 MED ORDER — CLOBETASOL PROPIONATE 0.05 % EX OINT: 1.0000 "application " | TOPICAL_OINTMENT | Freq: Two times a day (BID) | CUTANEOUS | Status: DC

## 2015-01-23 NOTE — Assessment & Plan Note (Signed)
Rash consistent with previously diagnosed dyshidrotic eczema. Recommended supportive measures such as limiting exposure to soaps, detergents, etc in addition to keeping hands dry. Will stop kenalog cream. Will start clobetasol ointment. Patient has referral to dermatology pending.

## 2015-01-23 NOTE — Progress Notes (Signed)
    Subjective:  Lisa Sullivan is a 49 y.o. female who presents to the [facility] today with a chief complaint of hand rash.  HPI:  Dyshidrotic Eczema. Patient seen at this clinic several times in the past for this condition. Was also recently seen at urgent care at started on kenalog cream. Patient reports that the kenalog cream has not helped. Patient was additionally given a course of prednisone which she said did not help. Patient reports that her symptoms are worse since her last visit here. Lesions are located mostly on the palmar aspect of her hands bilaterally. Lesions are painful and pruritic.   Depression Patient also reports a history of anhedonia and depressed mood for the past couple of months. Additionally reports difficulty sleeping, poor concentration, and low energy. Patient says that she has a life long history of feeling depressed, but has never been prescribed medication. States that this month in particular has been tough for her as she has lost 3 close family members. Denies SI. No known family history of depression, though does report that she had a cousin that committed suicide. Patient reports that she has a good support system with her family and church. Patient is interested in starting anti-depressant therapy today.   Healthcare Maintenance Due for Pap test today.   ROS: Per HPI  PMH:  The following were reviewed and entered/updated in epic: Past Medical History  Diagnosis Date  . Hypertension   . Gall stone    Patient Active Problem List   Diagnosis Date Noted  . Depression 01/23/2015  . Headache 12/15/2014  . Healthcare maintenance 12/15/2014  . Unspecified constipation 06/10/2013  . Hypertension 02/28/2011  . Goiter 02/28/2011  . OBESITY, UNSPECIFIED 03/27/2010  . SMOKER 03/27/2010  . 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 Cesarean section    . Tubal ligation       Objective:  Physical  Exam: BP 126/76 mmHg  Pulse 89  Temp(Src) 98.2 F (36.8 C) (Oral)  Ht 5\' 4"  (1.626 m)  Wt 224 lb 7 oz (101.804 kg)  BMI 38.51 kg/m2  LMP 11/11/2014  Gen: NAD, resting comfortably CV: RRR with no murmurs appreciated Lungs: NWOB, CTAB with no crackles, wheezes, or rhonchi GI: Normal bowel sounds present. Soft, Nontender, Nondistended. MSK:  - Hands: Bilateral palmar rash. Discrete papular lesions with exoriations noted. Nonerythematous. Located only on palms.  GU: -Normal external and internal female genitalia.  Skin: warm, dry, rash noted above.  Neuro: grossly normal, moves all extremities Psych: Normal affect and thought content  Assessment/Plan:  DYSHIDROTIC ECZEMA, HANDS Rash consistent with previously diagnosed dyshidrotic eczema. Recommended supportive measures such as limiting exposure to soaps, detergents, etc in addition to keeping hands dry. Will stop kenalog cream. Will start clobetasol ointment. Patient has referral to dermatology pending.   Depression Symptoms consistent with major depression. Patient with PHQ-9 score of 10 today. Will start celexa. Will follow up in 2 weeks.   Healthcare maintenance Pap test performed today.     Algis Greenhouse. [name], [place] Resident PGY-2 01/23/2015 5:04 PM

## 2015-01-23 NOTE — Assessment & Plan Note (Signed)
Pap test performed today

## 2015-01-23 NOTE — Assessment & Plan Note (Signed)
Symptoms consistent with major depression. Patient with PHQ-9 score of 10 today. Will start celexa. Will follow up in 2 weeks.

## 2015-01-23 NOTE — Patient Instructions (Addendum)
Thank you for coming to the clinic today. It was nice seeing you.  I think you have a condition called dyshidrotic eczema. We will start an high strength steroid ointment today called clobetasol. Please use twice daily.  Please also try these recommendations: ?Using lukewarm water and soap-free cleansers to wash hands ?Drying hands thoroughly after washing ?Applying emollients (eg, petroleum jelly) immediately after hand drying and as often as possible  ?Wearing cotton gloves under vinyl or other nonlatex gloves when performing wet work ?Removing rings and watches and bracelets before wet work ?Wearing protective gloves in cold weather  ?Wearing task-specific gloves for frictional exposures (eg, gardening, carpentry) ?Avoiding exposure to irritants (eg, detergents, solvents, hair lotions or dyes, acidic foods [eg, citrus fruit])   You will receive your pap smear results later this week.   We will start a new medication called celexa today for your depression. Please come back in 2 weeks so that we can further discuss your symptoms and see how you are doing.  Take care,  Dr [name]  Hand Dermatitis Hand dermatitis is a skin problem. Small, itchy, raised dots or blisters appear on the palms of the hands. Hand dermatitis can last 3 to 4 weeks. HOME CARE  Avoid washing your hands too much.  Avoid all harsh chemicals. Wear gloves when you use products that can bother your skin.  Use medicated cream (1% hydrocortisone cream) at least 2 to 4 times per day.  Only take medicine as told by your doctor.  You may use wet cloths (compresses) or cold packs. GET HELP RIGHT AWAY IF:   The rash is not better after 1 week of treatment.  The area is red, tender, or yellowish-white fluid (pus) comes from the wound.  The rash is spreading. MAKE SURE YOU:   Understand these instructions.  Will watch your condition.  Will get help right away if you are not doing well or get worse.   This  information is not intended to replace advice given to you by your health care provider. Make sure you discuss any questions you have with your health care provider.   Document Released: 06/05/2009 Document Revised: 06/03/2011 Document Reviewed: 09/23/2014 Elsevier Interactive Patient Education [employer].

## 2015-01-25 LAB — CYTOLOGY - PAP

## 2015-01-30 ENCOUNTER — Telehealth: Payer: Self-pay | Admitting: Family Medicine

## 2015-01-30 NOTE — Telephone Encounter (Signed)
Attempted to call patient several times with no answer to discuss. No voicemail set up. Patient will need to return in 6-12 for repeat pap test.   Lisa Sullivan. [name], [place] Resident PGY-2 01/30/2015 1:34 PM

## 2015-01-30 NOTE — Telephone Encounter (Signed)
Patient voiced understanding of results and that she will need a repeat pap. Jibran Crookshanks,CMA

## 2015-03-09 ENCOUNTER — Emergency Department (HOSPITAL_BASED_OUTPATIENT_CLINIC_OR_DEPARTMENT_OTHER)
Admission: EM | Admit: 2015-03-09 | Discharge: 2015-03-09 | Disposition: A | Discharge status: Home / Self Care | Payer: 59 | Attending: Emergency Medicine | Admitting: Emergency Medicine

## 2015-03-09 ENCOUNTER — Encounter (HOSPITAL_BASED_OUTPATIENT_CLINIC_OR_DEPARTMENT_OTHER): Payer: Self-pay | Admitting: *Deleted

## 2015-03-09 DIAGNOSIS — F17200 Nicotine dependence, unspecified, uncomplicated: Secondary | ICD-10-CM | POA: Diagnosis not present

## 2015-03-09 DIAGNOSIS — Z792 Long term (current) use of antibiotics: Secondary | ICD-10-CM | POA: Diagnosis not present

## 2015-03-09 DIAGNOSIS — Z91040 Latex allergy status: Secondary | ICD-10-CM | POA: Diagnosis not present

## 2015-03-09 DIAGNOSIS — I10 Essential (primary) hypertension: Secondary | ICD-10-CM | POA: Diagnosis not present

## 2015-03-09 DIAGNOSIS — Z8719 Personal history of other diseases of the digestive system: Secondary | ICD-10-CM | POA: Insufficient documentation

## 2015-03-09 DIAGNOSIS — L0291 Cutaneous abscess, unspecified: Secondary | ICD-10-CM

## 2015-03-09 DIAGNOSIS — L02415 Cutaneous abscess of right lower limb: Secondary | ICD-10-CM | POA: Diagnosis not present

## 2015-03-09 DIAGNOSIS — Z79899 Other long term (current) drug therapy: Secondary | ICD-10-CM | POA: Insufficient documentation

## 2015-03-09 DIAGNOSIS — L03115 Cellulitis of right lower limb: Secondary | ICD-10-CM | POA: Insufficient documentation

## 2015-03-09 DIAGNOSIS — L0390 Cellulitis, unspecified: Secondary | ICD-10-CM

## 2015-03-09 DIAGNOSIS — M79661 Pain in right lower leg: Secondary | ICD-10-CM | POA: Diagnosis present

## 2015-03-09 MED ORDER — CEPHALEXIN 500 MG PO CAPS: 500.0000 mg | ORAL_CAPSULE | Freq: Four times a day (QID) | ORAL | Status: DC

## 2015-03-09 MED ORDER — LIDOCAINE-EPINEPHRINE (PF) 2 %-1:200000 IJ SOLN
5.0000 mL | Freq: Once | INTRAMUSCULAR | Status: AC
  Administered 2015-03-09: 5 mL
  Filled 2015-03-09: qty 10

## 2015-03-09 NOTE — ED Provider Notes (Signed)
CSN: [id number]     Arrival date & time 03/09/15  1640 History   None    Chief Complaint  Patient presents with  . Insect Bite   HPI   Lisa Sullivan is a 49 y.o. F PMH significant for HTN presenting with a 3 day history of right lower leg pain, swelling, redness, and "blister." She describes her pain as 7/10 pain scale, constant, burning, non-radiating, localized at lesion sites. She states she thinks a bug bit her at work a few days ago. She does endorse right knee pain as well but is unsure of when her pain started, and thinks it is a chronic issue. She denies fever, chills, CP, palpitations, SOB, leg swelling.   Past Medical History  Diagnosis Date  . Hypertension   . Gall stone    Past Surgical History  Procedure Laterality Date  . Cesarean section    . Tubal ligation     Family History  Problem Relation Age of Onset  . Diabetes Mother   . Stroke Father   . Colon cancer Neg Hx   . Esophageal cancer Neg Hx   . Stomach cancer Neg Hx    Social History  Substance Use Topics  . Smoking status: Current Some Day Smoker -- 0.50 packs/day  . Smokeless tobacco: Never Used  . Alcohol Use: Yes     Comment: socially    OB History    No data available     Review of Systems  Ten systems are reviewed and are negative for acute change except as noted in the HPI   Allergies  Bee venom; Latex; and Sulfa antibiotics  Home Medications   Prior to Admission medications   Medication Sig Start Date End Date Taking? Authorizing Provider  benzonatate (TESSALON) 100 MG capsule Take 1 capsule (100 mg total) by mouth 3 (three) times daily as needed for cough. 08/09/14   Coral Spikes, DO  cephALEXin (KEFLEX) 500 MG capsule Take 1 capsule (500 mg total) by mouth 4 (four) times daily. 03/09/15   [place] Lions, PA-C  citalopram (CELEXA) 20 MG tablet Take 1 tablet (20 mg total) by mouth daily. 01/23/15   Vivi Barrack, MD  clobetasol ointment (TEMOVATE) AB-123456789 % Apply 1 application  topically 2 (two) times daily. 01/23/15   Vivi Barrack, MD  diphenhydrAMINE (BENADRYL) 25 MG tablet Take 1 tablet (25 mg total) by mouth every 6 (six) hours as needed. 10/31/14   Theodosia Quay, MD  EPINEPHrine 0.3 mg/0.3 mL IJ SOAJ injection Inject 0.3 mLs (0.3 mg total) into the muscle once. 10/31/14   Theodosia Quay, MD  hydrochlorothiazide (MICROZIDE) 12.5 MG capsule TAKE 2 CAPSULES (25 MG TOTAL) BY MOUTH EVERY MORNING. 11/30/14   Vivi Barrack, MD  levocetirizine (XYZAL) 5 MG tablet Take 1 tablet (5 mg total) by mouth every evening. 12/15/14   Vivi Barrack, MD  neomycin-polymyxin-hydrocortisone (CORTISPORIN) 3.5-10000-1 otic suspension Place 4 drops into the right ear 3 (three) times daily. 04/10/14   Dorie Rank, MD  pantoprazole (PROTONIX) 40 MG tablet Take 1 tablet (40 mg total) by mouth daily. 01/23/15   Vivi Barrack, MD  permethrin (ELIMITE) 5 % cream Apply topically to entire body and leave on overnight. Wash off in the morning and reapply in 14 days 12/23/14   Waldemar Dickens, MD   BP 140/98 mmHg  Pulse 67  Temp(Src) 98.2 F (36.8 C) (Oral)  Resp 16  Ht 5\' 4"  (1.626 m)  Wt  99.791 kg  BMI 37.74 kg/m2  SpO2 100% Physical Exam  Constitutional: She appears well-developed and well-nourished. No distress.  HENT:  Head: Normocephalic and atraumatic.  Mouth/Throat: Oropharynx is clear and moist. No oropharyngeal exudate.  Eyes: Conjunctivae are normal. Pupils are equal, round, and reactive to light. Right eye exhibits no discharge. Left eye exhibits no discharge. No scleral icterus.  Neck: No tracheal deviation present.  Cardiovascular: Normal rate, regular rhythm, normal heart sounds and intact distal pulses.  Exam reveals no gallop and no friction rub.   No murmur heard. Pulmonary/Chest: Effort normal and breath sounds normal. No respiratory distress. She has no wheezes. She has no rales. She exhibits no tenderness.  Abdominal: Soft. Bowel sounds are normal. She exhibits no distension and  no mass. There is no tenderness. There is no rebound and no guarding.  Musculoskeletal: Normal range of motion. She exhibits no edema.  Neurovascularly intact BL. Negative Homan's sign. Right knee without effusion, erythema, drainage.   Lymphadenopathy:    She has no cervical adenopathy.  Neurological: She is alert. Coordination normal.  Skin: Skin is warm and dry. She is not diaphoretic. No erythema.  One 3 cm abscess right anterior lower leg and one 1 cm abscess right lateral lower leg. Surrounding erythema on both lesions.   Psychiatric: She has a normal mood and affect. Her behavior is normal.  Nursing note and vitals reviewed.   ED Course  Procedures  INCISION AND DRAINAGE Performed by: Tracie Harrier Consent: Verbal consent obtained. Risks and benefits: risks, benefits and alternatives were discussed Type: abscess  Body area: right anterior lower leg  Anesthesia: local infiltration  Incision was made with a scalpel.  Local anesthetic: lidocaine 2% with epinephrine  Anesthetic total: 5 ml  Complexity: complex Blunt dissection to break up loculations  Drainage: purulent  Drainage amount: minimal  Packing material: 1/4 in iodoform gauze  Patient tolerance: Patient tolerated the procedure well with no immediate complications.   MDM   Final diagnoses:  Cellulitis and abscess   Patient non-toxic appearing and VSS. Most likely cellulitis with abscess. Less likely DVT vs fracture vs septic joint (knee pain).  Patient tolerated I&D well. Patient feeling better after I&D upon reevaluation. Patient may be safely discharged home. Will give keflex. Discussed reasons for return. Patient to follow-up with primary care provider within one week. Patient in understanding and agreement with the plan.   [place] Lions, PA-C 03/12/15 [id number]  Charlesetta Shanks, MD 03/22/15 [phone]

## 2015-03-09 NOTE — Discharge Instructions (Signed)
[name],  Nice meeting you! Please follow-up with your primary care provider as needed. Return to the emergency department if you develop chills, if your knee pain worsens, if the area of redness and warmth does not improve on antibiotics. Feel better soon!  S. Wendie Simmer, PA-C   Cellulitis Cellulitis is an infection of the skin and the tissue under the skin. The infected area is usually red and tender. This happens most often in the arms and lower legs. HOME CARE   Take your antibiotic medicine as told. Finish the medicine even if you start to feel better.  Keep the infected arm or leg raised (elevated).  Put a warm cloth on the area up to 4 times per day.  Only take medicines as told by your doctor.  Keep all doctor visits as told. GET HELP IF:  You see red streaks on the skin coming from the infected area.  Your red area gets bigger or turns a dark color.  Your bone or joint under the infected area is painful after the skin heals.  Your infection comes back in the same area or different area.  You have a puffy (swollen) bump in the infected area.  You have new symptoms.  You have a fever. GET HELP RIGHT AWAY IF:   You feel very sleepy.  You throw up (vomit) or have watery poop (diarrhea).  You feel sick and have muscle aches and pains.   This information is not intended to replace advice given to you by your health care provider. Make sure you discuss any questions you have with your health care provider.   Document Released: 08/28/2007 Document Revised: 11/30/2014 Document Reviewed: 05/27/2011 Elsevier Interactive Patient Education 2016 Elsevier Inc[employer]  Abscess An abscess (boil or furuncle) is an infected area on or under the skin. This area is filled with yellowish-white fluid (pus) and other material (debris). HOME CARE   Only take medicines as told by your doctor.  If you were given antibiotic medicine, take it as directed. Finish the medicine  even if you start to feel better.  If gauze is used, follow your doctor's directions for changing the gauze.  To avoid spreading the infection:  Keep your abscess covered with a bandage.  Wash your hands well.  Do not share personal care items, towels, or whirlpools with others.  Avoid skin contact with others.  Keep your skin and clothes clean around the abscess.  Keep all doctor visits as told. GET HELP RIGHT AWAY IF:   You have more pain, puffiness (swelling), or redness in the wound site.  You have more fluid or blood coming from the wound site.  You have muscle aches, chills, or you feel sick.  You have a fever. MAKE SURE YOU:   Understand these instructions.  Will watch your condition.  Will get help right away if you are not doing well or get worse.   This information is not intended to replace advice given to you by your health care provider. Make sure you discuss any questions you have with your health care provider.   Document Released: 08/28/2007 Document Revised: 09/10/2011 Document Reviewed: 05/25/2011 Elsevier Interactive Patient Education [employer].

## 2015-03-09 NOTE — ED Notes (Signed)
States she feels a bug bit her at work a few days ago. Blisters x 2 on her right lower leg. Redness, pain, swelling, hot to touch.

## 2015-04-10 ENCOUNTER — Other Ambulatory Visit: Payer: Self-pay | Admitting: Family Medicine

## 2015-04-26 ENCOUNTER — Encounter: Payer: Self-pay | Admitting: Family Medicine

## 2015-04-26 ENCOUNTER — Ambulatory Visit (HOSPITAL_COMMUNITY)
Admission: RE | Admit: 2015-04-26 | Discharge: 2015-04-26 | Disposition: A | Discharge status: Home / Self Care | Payer: BLUE CROSS/BLUE SHIELD | Source: Ambulatory Visit | Attending: Family Medicine | Admitting: Family Medicine

## 2015-04-26 ENCOUNTER — Ambulatory Visit (INDEPENDENT_AMBULATORY_CARE_PROVIDER_SITE_OTHER): Payer: BLUE CROSS/BLUE SHIELD | Admitting: Family Medicine

## 2015-04-26 VITALS — BP 133/96 | HR 79 | Temp 97.8°F | Wt 225.5 lb

## 2015-04-26 DIAGNOSIS — M25561 Pain in right knee: Secondary | ICD-10-CM | POA: Diagnosis not present

## 2015-04-26 DIAGNOSIS — M25569 Pain in unspecified knee: Secondary | ICD-10-CM

## 2015-04-26 DIAGNOSIS — M25562 Pain in left knee: Secondary | ICD-10-CM | POA: Insufficient documentation

## 2015-04-26 MED ORDER — MELOXICAM 7.5 MG PO TABS: 7.5000 mg | ORAL_TABLET | Freq: Every day | ORAL | Status: DC

## 2015-04-26 MED ORDER — ACETAMINOPHEN 500 MG PO TABS: 1000.0000 mg | ORAL_TABLET | Freq: Three times a day (TID) | ORAL | Status: DC | PRN

## 2015-04-26 NOTE — Patient Instructions (Signed)
I think you have arthritis causing your knee pain. We will check xrays to confirm this. I sent in two prescriptions. You can take the extra strength tylenol 3 times a day as needed for pain. I also sent in a prescription for mobic. Please take 1 pill a day for the next 2 weeks, then as needed afterwards. Please do not take ibuprofen, alleve, or naproxen with this medication.  Please come back in 3-4 weeks if your symptoms are not getting better.  Please schedule a regular appointment with me soon.  Take care,  Dr [name]

## 2015-04-26 NOTE — Assessment & Plan Note (Addendum)
Most likely mild OA. No effusion or erythema to suggest fracture, gout, or septic arthritis. Doubt soft tissue damage given lack of injury and negative anterior/posterior draw signs. Will obtain plain films. Treat with tylenol as needed. If not improving and plain films negative, can consider referral to sports medicine vs advanced imaging.

## 2015-04-26 NOTE — Progress Notes (Signed)
    Subjective:  JUNI FASS is a 50 y.o. female who presents to the [facility] today with a chief complaint of left knee pain.   HPI:  Left Knee Pain First noticed about a month ago. Located "all over" her left knee. No injuries or falls. Some increased swelling. Pain is described as "soreness" and worse with standing, walking, and climbing stairs. Has tried tylenol and ibuprofen which helps some. No fevers or chills. No weakness or numbness. Has never had pain in her knee like this before.   Left Arm Pain Started a few weeks ago. Uses her arms a lot and it is painful when lifting. Pain located along radial aspect of left forearm. No recent injuries. Tylenol and ibuprofen have helped some. No rashes.   ROS: Per HPI  PMH:  The following were reviewed and entered/updated in epic: Past Medical History  Diagnosis Date  . Hypertension   . Gall stone    Patient Active Problem List   Diagnosis Date Noted  . Left knee pain 04/26/2015  . Depression 01/23/2015  . Headache 12/15/2014  . Healthcare maintenance 12/15/2014  . Unspecified constipation 06/10/2013  . Hypertension 02/28/2011  . Goiter 02/28/2011  . OBESITY, UNSPECIFIED 03/27/2010  . SMOKER 03/27/2010  . 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 Cesarean section    . Tubal ligation      Objective:  Physical Exam: BP 133/96 mmHg  Pulse 79  Temp(Src) 97.8 F (36.6 C) (Oral)  Wt 225 lb 8 oz (102.286 kg)  Gen: NAD, resting comfortably MSK: - Left Knee: No deformities. No erythema. No effusion. Cool to touch. FROM. Crepitus noted with passive ROM. Tender to palpation along joint line. No pain with varus or valgus stress. Negative anterior and posterior drawer.  - Right Knee: No deformities, erythema or effusions. Cool to touch. FROM. No crepitus noted with passive ROM. Nontender.  - Left Forearm: No deformities. Tender to palpation along radial muscle groups. Pain worsened  with resisted pronation. Medial and radial epicondyle nontender to palpation.  Skin: warm, dry, no rashes Neuro: grossly normal, moves all extremities Psych: Normal affect and thought content  Assessment/Plan:  Left knee pain Most likely mild OA. No effusion or erythema to suggest fracture, gout, or septic arthritis. Doubt soft tissue damage given lack of injury and negative anterior/posterior draw signs. Will obtain plain films. Treat with tylenol as needed. If not improving and plain films negative, can consider referral to sports medicine vs advanced imaging.   Left Forearm Pain Likely muscular strain. No signs of medial or lateral epicondylitis. Will treat conservatively with 2 week course of mobic and rest. Follow up as needed. If not improving, can consider referral to physical therapy.   Algis Greenhouse. [name], [place] Resident PGY-2 04/26/2015 10:08 AM

## 2015-06-14 ENCOUNTER — Other Ambulatory Visit: Payer: Self-pay | Admitting: Family Medicine

## 2015-06-14 NOTE — Telephone Encounter (Signed)
Rx filled.  Algis Greenhouse. [name], [place] Resident PGY-2 06/14/2015 6:01 PM

## 2015-07-14 ENCOUNTER — Ambulatory Visit: Payer: BLUE CROSS/BLUE SHIELD

## 2015-08-19 ENCOUNTER — Emergency Department (HOSPITAL_BASED_OUTPATIENT_CLINIC_OR_DEPARTMENT_OTHER)
Admission: EM | Admit: 2015-08-19 | Discharge: 2015-08-19 | Disposition: A | Discharge status: Home / Self Care | Payer: BLUE CROSS/BLUE SHIELD | Attending: Emergency Medicine | Admitting: Emergency Medicine

## 2015-08-19 ENCOUNTER — Encounter (HOSPITAL_BASED_OUTPATIENT_CLINIC_OR_DEPARTMENT_OTHER): Payer: Self-pay | Admitting: Emergency Medicine

## 2015-08-19 DIAGNOSIS — R21 Rash and other nonspecific skin eruption: Secondary | ICD-10-CM | POA: Diagnosis not present

## 2015-08-19 DIAGNOSIS — Z79899 Other long term (current) drug therapy: Secondary | ICD-10-CM | POA: Insufficient documentation

## 2015-08-19 DIAGNOSIS — R0602 Shortness of breath: Secondary | ICD-10-CM | POA: Insufficient documentation

## 2015-08-19 DIAGNOSIS — I10 Essential (primary) hypertension: Secondary | ICD-10-CM | POA: Insufficient documentation

## 2015-08-19 DIAGNOSIS — F17200 Nicotine dependence, unspecified, uncomplicated: Secondary | ICD-10-CM | POA: Diagnosis not present

## 2015-08-19 LAB — CBG MONITORING, ED: Glucose-Capillary: 106 mg/dL — ABNORMAL HIGH (ref 65–99)

## 2015-08-19 MED ORDER — HYDROXYZINE HCL 25 MG PO TABS: 25.0000 mg | ORAL_TABLET | Freq: Four times a day (QID) | ORAL | Status: DC | PRN

## 2015-08-19 NOTE — ED Provider Notes (Addendum)
CSN: [id number]     Arrival date & time 08/19/15  0846 History   First MD Initiated Contact with Patient 08/19/15 [phone]     Chief Complaint  Patient presents with  . Rash     (Consider location/radiation/quality/duration/timing/severity/associated sxs/prior Treatment) HPI Complains of pruritic rash on bilateral arms onset yesterday . Other associated symptoms include mild shortness of breath which started this morning. No other associated symptoms. She took Benadryl yesterday, without relief. No other associated symptoms no fever. She does not feel ill. Nothing makes symptoms better or worse Past Medical History  Diagnosis Date  . Hypertension   . Gall stone    Past Surgical History  Procedure Laterality Date  . Cesarean section    . Tubal ligation     Family History  Problem Relation Age of Onset  . Diabetes Mother   . Stroke Father   . Colon cancer Neg Hx   . Esophageal cancer Neg Hx   . Stomach cancer Neg Hx    Social History  Substance Use Topics  . Smoking status: Current Some Day Smoker -- 0.50 packs/day  . Smokeless tobacco: Never Used  . Alcohol Use: Yes     Comment: socially    OB History    No data available     Review of Systems  Constitutional: Negative.   HENT: Negative.   Respiratory: Positive for shortness of breath.   Cardiovascular: Negative.   Gastrointestinal: Negative.   Musculoskeletal: Negative.   Skin: Positive for rash.  Neurological: Negative.   Psychiatric/Behavioral: Negative.       Allergies  Bee venom; Latex; and Sulfa antibiotics  Home Medications   Prior to Admission medications   Medication Sig Start Date End Date Taking? Authorizing Provider  citalopram (CELEXA) 20 MG tablet Take 1 tablet (20 mg total) by mouth daily. 01/23/15  Yes Vivi Barrack, MD  diphenhydrAMINE (BENADRYL) 25 MG tablet Take 1 tablet (25 mg total) by mouth every 6 (six) hours as needed. 10/31/14  Yes Theodosia Quay, MD  hydrochlorothiazide (MICROZIDE)  12.5 MG capsule TAKE 2 CAPSULES BY MOUTH EVERY MORNING 06/14/15  Yes Vivi Barrack, MD  meloxicam (MOBIC) 7.5 MG tablet TAKE 1 TABLET BY MOUTH DAILY 06/14/15  Yes Vivi Barrack, MD  permethrin (ELIMITE) 5 % cream Apply topically to entire body and leave on overnight. Wash off in the morning and reapply in 14 days 12/23/14  Yes Waldemar Dickens, MD  acetaminophen (TYLENOL) 500 MG tablet Take 2 tablets (1,000 mg total) by mouth every 8 (eight) hours as needed. 04/26/15   Vivi Barrack, MD  EPINEPHrine 0.3 mg/0.3 mL IJ SOAJ injection Inject 0.3 mLs (0.3 mg total) into the muscle once. 10/31/14   Theodosia Quay, MD  levocetirizine (XYZAL) 5 MG tablet Take 1 tablet (5 mg total) by mouth every evening. 12/15/14   Vivi Barrack, MD  pantoprazole (PROTONIX) 40 MG tablet Take 1 tablet (40 mg total) by mouth daily. 01/23/15   Vivi Barrack, MD   BP 150/91 mmHg  Pulse 73  Temp(Src) 98.4 F (36.9 C) (Oral)  Resp 18  Ht 5\' 4"  (1.626 m)  Wt 218 lb (98.884 kg)  BMI 37.40 kg/m2  SpO2 98%  LMP 08/11/2015 Physical Exam  Constitutional: She is oriented to person, place, and time. She appears well-developed and well-nourished. No distress.  HENT:  Head: Normocephalic and atraumatic.  No mucosal lesion  Eyes: Conjunctivae are normal. Pupils are equal, round, and reactive to light.  Neck: Neck supple. No tracheal deviation present. No thyromegaly present.  Cardiovascular: Normal rate and regular rhythm.   No murmur heard. Pulmonary/Chest: Effort normal and breath sounds normal.  Speaks in paragraphs no respiratory distress  Abdominal: Soft. Bowel sounds are normal. She exhibits no distension. There is no tenderness.  Obese  Musculoskeletal: Normal range of motion. She exhibits no edema or tenderness.  Neurological: She is alert and oriented to person, place, and time. Coordination normal.  Skin: Skin is warm and dry. Rash noted.  Fine pinpoint papular rash on arms only not involving palms or soles or trunk or  face  Psychiatric: She has a normal mood and affect.  Nursing note and vitals reviewed.   ED Course  Procedures (including critical care time) Labs Review Labs Reviewed - No data to display  Imaging Review No results found. I have personally reviewed and evaluated these images and lab results as part of my medical decision-making.   EKG Interpretation None      MDM  I don't think that patient would benefit from steroids. She is not ill-appearing is has normal respiratory rate, normal pulse oximetry does not appear dyspneic. And her illness does not appear to be systemic. She is also taking mobic, and combination of steroid and and NSAID is potentially ulcerogenic Plan hydrocortisone cream as directed 3 times daily. Prescription Atarax. DC Benadryl. Follow-up family practice clinic if not better in a week. Blood pressure recheck 3 weeks. I counseled patient for 5 minutes on smoking cessation Diagnosis #1 rash #2 elevated blood pressure #3 tobacco abuse Finaol diagnoses:  None        Orlie Dakin, MD 08/19/15 [place], MD 08/19/15 [phone]

## 2015-08-19 NOTE — ED Notes (Addendum)
Pt c/o itchy rash to both arms which started 2 days ago. Pt has been taking benadryl without relief. Pt reports some SOB this morning. Pt in no obvious distress.

## 2015-08-19 NOTE — Discharge Instructions (Signed)
Stop Benadryl. Take the medication prescribed instead as needed for itch. Apply hydrocortisone cream 3 times daily to the rash on her arms. Stop the calamine lotion. If rash is not better within the next 7-10 days, see your primary care physician at [facility]. Get your blood pressure recheck within the next 3 weeks. Today's was elevated at 150/91. Take your blood pressure medication as prescribed. Ask your primary care physician to help you to stop smoking.

## 2015-08-19 NOTE — ED Notes (Signed)
MD at bedside. 

## 2015-08-23 ENCOUNTER — Ambulatory Visit (INDEPENDENT_AMBULATORY_CARE_PROVIDER_SITE_OTHER): Payer: BLUE CROSS/BLUE SHIELD | Admitting: Student

## 2015-08-23 ENCOUNTER — Encounter: Payer: Self-pay | Admitting: Student

## 2015-08-23 VITALS — BP 135/91 | HR 77 | Temp 98.4°F | Wt 229.0 lb

## 2015-08-23 DIAGNOSIS — L301 Dyshidrosis [pompholyx]: Secondary | ICD-10-CM | POA: Diagnosis not present

## 2015-08-23 MED ORDER — CLOBETASOL PROPIONATE 0.05 % EX CREA: 1.0000 "application " | TOPICAL_CREAM | Freq: Two times a day (BID) | CUTANEOUS | Status: DC

## 2015-08-23 MED ORDER — HYDROCORTISONE 1 % EX OINT: 1.0000 "application " | TOPICAL_OINTMENT | Freq: Two times a day (BID) | CUTANEOUS | Status: DC

## 2015-08-23 MED ORDER — ZOSTER VACCINE LIVE 19400 UNT/0.65ML ~~LOC~~ SUSR: 0.6500 mL | Freq: Once | SUBCUTANEOUS | Status: DC

## 2015-08-23 NOTE — Progress Notes (Signed)
   Subjective:    Patient ID: Lisa Sullivan, female    DOB: May 28, 1965, 50 y.o.   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 presents for worsening rash on hands  Rash on hands - Started to worsen on 5/26 - Went to emergency room at bedtime when she was given hydrocortisone cream with minimal mole improvement -Feels she is starting to have small breakouts on her arm and minimally on her legs -  denies chance of breath, throat swelling, nausea, vomiting, diarrhea, abdominal pain  - She has had similar rashes in past with her dyshidrotic eczema exacerbations  - States the rash is dry and painful - Does not moisturize after every time she washes her hands, but does try to moisturize everyday  Smoking status reviewed  Review of Systems Per history of present illness otherwise she denies recent illnesses, fevers, chest pain   Objective:  BP 135/91 mmHg  Pulse 77  Temp(Src) 98.4 F (36.9 C) (Oral)  Wt 229 lb (103.874 kg)  SpO2 93%  LMP 08/11/2015 Vitals and nursing note reviewed  General: NAD Cardiac: RRR,  Respiratory: CTAB, normal effort Extremities: deep vesicles and bullae on bilateral palms, vessicles on dorsal aspects of hands and on digits, with some areas of mild cracking, mild vesicular lesions on bilateral upper arms, few papules on bilateral LEs Skin:  Neuro: alert and oriented, no focal deficits   Assessment & Plan:    DYSHIDROTIC ECZEMA, HANDS History and physical exam consistent with dyshidrotic eczema exacerbation. -Will treat with clobetasol for 2 weeks to be applied twice a day on hands - Hydrocortisone cream for arms and legs -  patient strongly counseled to moisturize her skin twice daily with  Eucerin cream and Vaseline . Instructed to moisturize her hands at night and sleep with gloves on  -Follow-up in 2 weeks to assess for improvement        Alyssa A. Lincoln Brigham MD, [facility] Resident PGY-2 Pager [phone]

## 2015-08-23 NOTE — Assessment & Plan Note (Signed)
History and physical exam consistent with dyshidrotic eczema exacerbation. -Will treat with clobetasol for 2 weeks to be applied twice a day on hands - Hydrocortisone cream for arms and legs -  patient strongly counseled to moisturize her skin twice daily with  Eucerin cream and Vaseline . Instructed to moisturize her hands at night and sleep with gloves on  -Follow-up in 2 weeks to assess for improvement

## 2015-08-23 NOTE — Patient Instructions (Addendum)
Follow-up with primary care provider in 2 weeks for eczema Use clobetasol on hands twice a day for 2 weeks Use hydrocortisone on arms and leg rash twice daily until resolution of rash Do not put hydrocortisone or clobetasol on  your face Moisturize daily with lotion and Vaseline. Moisturize hands with lotion of Vaseline and cover them with gloves overnight Please take Celexa daily and Call the office with any questions or concerns

## 2015-09-06 ENCOUNTER — Encounter: Payer: Self-pay | Admitting: Family Medicine

## 2015-09-06 ENCOUNTER — Ambulatory Visit (INDEPENDENT_AMBULATORY_CARE_PROVIDER_SITE_OTHER): Payer: BLUE CROSS/BLUE SHIELD | Admitting: Family Medicine

## 2015-09-06 VITALS — BP 145/101 | HR 79 | Temp 98.1°F | Ht 64.0 in | Wt 234.6 lb

## 2015-09-06 DIAGNOSIS — L301 Dyshidrosis [pompholyx]: Secondary | ICD-10-CM | POA: Diagnosis not present

## 2015-09-06 MED ORDER — PREDNISONE 10 MG PO TABS: ORAL_TABLET | ORAL | Status: DC

## 2015-09-06 MED ORDER — CLOBETASOL PROPIONATE 0.05 % EX CREA: 1.0000 "application " | TOPICAL_CREAM | Freq: Two times a day (BID) | CUTANEOUS | Status: AC

## 2015-09-06 MED ORDER — PREDNISONE 20 MG PO TABS: 60.0000 mg | ORAL_TABLET | Freq: Every day | ORAL | Status: DC

## 2015-09-06 NOTE — Progress Notes (Signed)
RASH Started a few weeks ago. Has hx of eczema but never effected the palms or soles (like it is now). No triggering event or new soaps/detergents.   Had rash for 2-3 weeks. Location: palms and soles of feet Medications tried: Clobetasol, hydrocortisone, and permethrin.  Similar rash in past: eczema in the past but never in these locations.  New medications or antibiotics: no Tick, Insect or new pet exposure: no Recent travel: no New detergent or soap: no Immunocompromised: no  Symptoms Itching: yes Pain over rash: yes Feeling ill all over: no Fever: no Mouth sores: no Face or tongue swelling: no Trouble breathing: no Joint swelling or pain: no  Review of Symptoms - see HPI PMH - Smoking status noted.    CC, SH/smoking status, and VS noted  Objective: BP 145/101 mmHg  Pulse 79  Temp(Src) 98.1 F (36.7 C) (Oral)  Ht 5\' 4"  (1.626 m)  Wt 234 lb 9.6 oz (106.414 kg)  BMI 40.25 kg/m2  LMP 08/11/2015 Gen: NAD, alert, cooperative, and pleasant. Ext: Vesicles and bullae on bilateral palms with thickened/keratinized skin changes throughout, vessicles on dorsal aspects of hands and digits, similar vesicular changes noted on plantar and dorsal surfaces of the feet bilaterally (to a significantly lesser degree than the upper extremities). [See photos below] Neuro: Alert and oriented, Speech clear, No gross deficits      Procedure note: skin punch biopsy Written informed consent received. Area was cleaned with Betadine and alcohol swabs x 2, then injected with a total of 3 cm 2% lidocaine with epinephrine.  Good anesthesia achieved.  Punch biopsy taken at border and in middle of lesion at the left ulnar aspect of the palm. Hemostasis achieved promptly with compression Area covered with gauze with tape Patient was advised to keep area clean and dry and change dressing daily for the next few days to prevent infection.     Assessment and plan:  DYSHIDROTIC ECZEMA,  HANDS Stable: Patient is here with persistent/unchanged symptoms of the skin of her hands and feet bilaterally. She was recently prescribed clobetasol cream which she applied twice daily without significant improvement. - Oral prednisone burst with taper: 60 mg 7 days, 30 mg 7 days, 20 mg 4 days, 10 mg 4 days, 5 mg 4 days. - Biopsy of one of these skin lesions obtained in clinic today. - Patient states that she has a appointment with dermatology on 10/12/15. - Would consider possible referral to allergist if treatment continues to be ineffective. - Follow-up in one week to assess any improvement with prednisone burst/taper.    Meds ordered this encounter  Medications  . predniSONE (DELTASONE) 20 MG tablet    Sig: Take 3 tablets (60 mg total) by mouth daily with breakfast.    Dispense:  21 tablet    Refill:  0  . predniSONE (DELTASONE) 10 MG tablet    Sig: Starting in 1 week. Take 3 tablets once a day for 7 days. Then take 2 tabs for 4 days. Then 1 tab for 4 days. Then a 1/2 tab for 4 days.    Dispense:  35 tablet    Refill:  0  . clobetasol cream (TEMOVATE) 0.05 %    Sig: Apply 1 application topically 2 (two) times daily. Use twice daily for two weeks on hands    Dispense:  30 g    Refill:  0     Elberta Leatherwood, MD,MS,  PGY2 09/06/2015 7:19 PM

## 2015-09-06 NOTE — Patient Instructions (Signed)
Rash Treatment - you should: - Continue applying the cream that was provided at the previous visit. - Take the prednisone as directed on the bottle. This will be a slow taper. You will begin taking 60 mg for the first week. The second week will require taking 30 mg. After that we will be tapering the dose down to 20 mg for 4 days, 10 mg for the following 4 days, and 5 mg for the final 4 days.  You should be better in: 1 week you should notice some improvement. Call us or go to the ER if you have high fever, sores in your mouth, feel very ill or the rash is a lot worse. Come back to see [place] in one week to track your progress.

## 2015-09-06 NOTE — Assessment & Plan Note (Signed)
Stable: Patient is here with persistent/unchanged symptoms of the skin of her hands and feet bilaterally. She was recently prescribed clobetasol cream which she applied twice daily without significant improvement. - Oral prednisone burst with taper: 60 mg 7 days, 30 mg 7 days, 20 mg 4 days, 10 mg 4 days, 5 mg 4 days. - Biopsy of one of these skin lesions obtained in clinic today. - Patient states that she has a appointment with dermatology on 10/12/15. - Would consider possible referral to allergist if treatment continues to be ineffective. - Follow-up in one week to assess any improvement with prednisone burst/taper.

## 2015-10-03 ENCOUNTER — Encounter (HOSPITAL_COMMUNITY): Payer: Self-pay | Admitting: *Deleted

## 2015-10-03 ENCOUNTER — Emergency Department (HOSPITAL_COMMUNITY)
Admission: EM | Admit: 2015-10-03 | Discharge: 2015-10-03 | Disposition: A | Discharge status: Home / Self Care | Payer: BLUE CROSS/BLUE SHIELD | Attending: Emergency Medicine | Admitting: Emergency Medicine

## 2015-10-03 DIAGNOSIS — Z91040 Latex allergy status: Secondary | ICD-10-CM | POA: Insufficient documentation

## 2015-10-03 DIAGNOSIS — I10 Essential (primary) hypertension: Secondary | ICD-10-CM | POA: Insufficient documentation

## 2015-10-03 DIAGNOSIS — F329 Major depressive disorder, single episode, unspecified: Secondary | ICD-10-CM | POA: Diagnosis not present

## 2015-10-03 DIAGNOSIS — L0291 Cutaneous abscess, unspecified: Secondary | ICD-10-CM

## 2015-10-03 DIAGNOSIS — Z79899 Other long term (current) drug therapy: Secondary | ICD-10-CM | POA: Insufficient documentation

## 2015-10-03 DIAGNOSIS — F17200 Nicotine dependence, unspecified, uncomplicated: Secondary | ICD-10-CM | POA: Diagnosis not present

## 2015-10-03 DIAGNOSIS — L02416 Cutaneous abscess of left lower limb: Secondary | ICD-10-CM | POA: Diagnosis not present

## 2015-10-03 LAB — CBC
HEMATOCRIT: 41.2 % (ref 36.0–46.0)
HEMOGLOBIN: 13.4 g/dL (ref 12.0–15.0)
MCH: 28 pg (ref 26.0–34.0)
MCHC: 32.5 g/dL (ref 30.0–36.0)
MCV: 86 fL (ref 78.0–100.0)
Platelets: 259 10*3/uL (ref 150–400)
RBC: 4.79 MIL/uL (ref 3.87–5.11)
RDW: 14.6 % (ref 11.5–15.5)
WBC: 7.6 10*3/uL (ref 4.0–10.5)

## 2015-10-03 LAB — BASIC METABOLIC PANEL
ANION GAP: 10 (ref 5–15)
BUN: 18 mg/dL (ref 6–20)
CHLORIDE: 105 mmol/L (ref 101–111)
CO2: 25 mmol/L (ref 22–32)
Calcium: 9.4 mg/dL (ref 8.9–10.3)
Creatinine, Ser: 0.86 mg/dL (ref 0.44–1.00)
GFR calc non Af Amer: >60 mL/min (ref >60)
Glucose, Bld: 117 mg/dL — ABNORMAL HIGH (ref 65–99)
POTASSIUM: 3.6 mmol/L (ref 3.5–5.1)
SODIUM: 140 mmol/L (ref 135–145)

## 2015-10-03 LAB — I-STAT CG4 LACTIC ACID, ED: Lactic Acid, Venous: 1.4 mmol/L (ref 0.5–1.9)

## 2015-10-03 MED ORDER — LIDOCAINE HCL (PF) 1 % IJ SOLN
20.0000 mL | Freq: Once | INTRAMUSCULAR | Status: AC
  Administered 2015-10-03: 20 mL
  Filled 2015-10-03: qty 30

## 2015-10-03 MED ORDER — DOXYCYCLINE HYCLATE 100 MG PO CAPS: 100.0000 mg | ORAL_CAPSULE | Freq: Two times a day (BID) | ORAL | Status: DC

## 2015-10-03 NOTE — ED Notes (Addendum)
Patient states she had an abscess on her left lower leg drained at [facility] ED yesterday.  Patient states an I&D was performed, but today she presents with swelling in left foot and leg that has increased since yesterday.  Patient denies N/V and fever.  Patient states she was not started on oral abx following the I&D, but was prescribed topical abx.

## 2015-10-03 NOTE — ED Provider Notes (Signed)
Emergency Department Provider Note  Time seen: Approximately 6:11 PM  I have reviewed the triage vital signs and the nursing notes.   HISTORY  Chief Complaint Leg Swelling   HPI TEMAKA OSU is a 50 y.o. female presents to the emergency department for evaluation of worsening left leg pain and swelling in the setting of recent incision and drainage of an abscess on the leg. She went to the emergency department at [facility] yesterday for incision and drainage. She has had history of similar lesions in the past. No history of diabetes. She was not prescribed antibiotic and discharged. Since returning home going to stop draining and her legs become more swollen and painful.  Past Medical History  Diagnosis Date  . Hypertension   . Gall stone     Patient Active Problem List   Diagnosis Date Noted  . Left knee pain 04/26/2015  . Depression 01/23/2015  . Headache 12/15/2014  . Healthcare maintenance 12/15/2014  . Unspecified constipation 06/10/2013  . Hypertension 02/28/2011  . Goiter 02/28/2011  . OBESITY, UNSPECIFIED 03/27/2010  . SMOKER 03/27/2010  . DYSHIDROTIC ECZEMA, HANDS 03/27/2010    Past Surgical History  Procedure Laterality Date  . Cesarean section    . Tubal ligation      Current Outpatient Rx  Name  Route  Sig  Dispense  Refill  . citalopram (CELEXA) 20 MG tablet   Oral   Take 1 tablet (20 mg total) by mouth daily.   30 tablet   3   . clobetasol cream (TEMOVATE) 0.05 %   Topical   Apply 1 application topically 2 (two) times daily. On hands.      0   . diphenhydrAMINE (BENADRYL) 25 MG tablet   Oral   Take 1 tablet (25 mg total) by mouth every 6 (six) hours as needed. Patient taking differently: Take 25 mg by mouth every 6 (six) hours as needed for allergies.    12 tablet   0   . EPINEPHrine 0.3 mg/0.3 mL IJ SOAJ injection   Intramuscular   Inject 0.3 mLs (0.3 mg total) into the muscle once.   1 Device   2   . hydrochlorothiazide (MICROZIDE)  12.5 MG capsule      TAKE 2 CAPSULES BY MOUTH EVERY MORNING   60 capsule   2   . hydrocortisone 1 % ointment   Topical   Apply 1 application topically 2 (two) times daily.   30 g   0   . hydrOXYzine (ATARAX/VISTARIL) 25 MG tablet   Oral   Take 1 tablet (25 mg total) by mouth every 6 (six) hours as needed for itching.   15 tablet   0   . levocetirizine (XYZAL) 5 MG tablet   Oral   Take 1 tablet (5 mg total) by mouth every evening.   30 tablet   3   . meloxicam (MOBIC) 7.5 MG tablet      TAKE 1 TABLET BY MOUTH DAILY   30 tablet   0   . mupirocin cream (BACTROBAN) 2 %   Topical   Apply 1 application topically 3 (three) times daily. x7 days      0   . OVER THE COUNTER MEDICATION   Both Eyes   Place 1 drop into both eyes daily.          Marland Kitchen Zoster Vaccine Live, PF, (ZOSTAVAX) 09811 UNT/0.65ML injection   Subcutaneous   Inject 19,400 Units into the skin once.  1 each   0   . acetaminophen (TYLENOL) 500 MG tablet   Oral   Take 2 tablets (1,000 mg total) by mouth every 8 (eight) hours as needed. Patient not taking: Reported on 08/23/2015   60 tablet   0   . pantoprazole (PROTONIX) 40 MG tablet   Oral   Take 1 tablet (40 mg total) by mouth daily. Patient not taking: Reported on 10/03/2015   30 tablet   3   . permethrin (ELIMITE) 5 % cream      Apply topically to entire body and leave on overnight. Wash off in the morning and reapply in 14 days Patient not taking: Reported on 08/23/2015   60 g   2   . predniSONE (DELTASONE) 10 MG tablet      Starting in 1 week. Take 3 tablets once a day for 7 days. Then take 2 tabs for 4 days. Then 1 tab for 4 days. Then a 1/2 tab for 4 days. Patient not taking: Reported on 10/03/2015   35 tablet   0   . predniSONE (DELTASONE) 20 MG tablet   Oral   Take 3 tablets (60 mg total) by mouth daily with breakfast. Patient not taking: Reported on 10/03/2015   21 tablet   0     Allergies Bee venom; Latex; and Sulfa  antibiotics  Family History  Problem Relation Age of Onset  . Diabetes Mother   . Stroke Father   . Colon cancer Neg Hx   . Esophageal cancer Neg Hx   . Stomach cancer Neg Hx     Social History Social History  Substance Use Topics  . Smoking status: Current Some Day Smoker -- 0.50 packs/day  . Smokeless tobacco: Never Used  . Alcohol Use: Yes     Comment: socially     Review of Systems  Constitutional: No fever/chills Eyes: No visual changes. ENT: No sore throat. Cardiovascular: Denies chest pain. Respiratory: Denies shortness of breath. Gastrointestinal: No abdominal pain.  No nausea, no vomiting.  No diarrhea.  No constipation. Genitourinary: Negative for dysuria. Musculoskeletal: Negative for back pain. Left leg pain and swelling.  Skin:Rash over left leg.  Neurological: Negative for headaches, focal weakness or numbness.  10-point ROS otherwise negative.  ____________________________________________   PHYSICAL EXAM:  VITAL SIGNS: ED Triage Vitals  Enc Vitals Group     BP 10/03/15 1413 140/92 mmHg     Pulse Rate 10/03/15 1413 93     Resp 10/03/15 1413 16     Temp 10/03/15 1413 98.4 F (36.9 C)     Temp Source 10/03/15 1413 Oral     SpO2 10/03/15 1413 96 %     Weight 10/03/15 1413 224 lb (101.606 kg)     Height 10/03/15 1413 5\' 4"  (1.626 m)     Pain Score 10/03/15 1423 7    Constitutional: Alert and oriented. Well appearing and in no acute distress. Eyes: Conjunctivae are normal. PERRL. EOMI. Head: Atraumatic. Nose: No congestion/rhinnorhea. Mouth/Throat: Mucous membranes are moist.  Oropharynx non-erythematous. Neck: No stridor.   Cardiovascular: Normal rate, regular rhythm. Good peripheral circulation. Grossly normal heart sounds.   Respiratory: Normal respiratory effort.  No retractions. Lungs CTAB. Gastrointestinal: Soft and nontender. No distention.  Musculoskeletal: Edema and tenderness over the left leg anterior. No drainage. Some fluctuance  appreciated. No gross deformities of extremities. Neurologic:  Normal speech and language. No gross focal neurologic deficits are appreciated.  Skin:  Skin is warm, dry and intact.  Mild erythema and warmth over left arnterior leg.  Psychiatric: Mood and affect are normal. Speech and behavior are normal.  ____________________________________________   LABS (all labs ordered are listed, but only abnormal results are displayed)  Labs Reviewed  BASIC METABOLIC PANEL - Abnormal; Notable for the following:    Glucose, Bld 117 (*)    All other components within normal limits  CBC  I-STAT CG4 LACTIC ACID, ED   ____________________________________________  RADIOLOGY  No results found.  None ____________________________________________   PROCEDURES  Procedure(s) performed:   Marland KitchenMarland KitchenIncision and Drainage Date/Time: 10/03/2015 6:50 PM Performed by: Lester Platas G Authorized by: Margette Fast Consent: Verbal consent obtained. Risks and benefits: risks, benefits and alternatives were discussed Consent given by: patient Required items: required blood products, implants, devices, and special equipment available Patient identity confirmed: verbally with patient and arm band Type: abscess Body area: lower extremity Location details: left leg Anesthesia: local infiltration Local anesthetic: lidocaine 1% without epinephrine Anesthetic total: 5 ml Patient sedated: no Scalpel size: 11 Needle gauge: 20 Incision type: single straight Incision depth: dermal Complexity: simple Drainage: purulent Drainage amount: moderate Wound treatment: wound left open Patient tolerance: Patient tolerated the procedure well with no immediate complications   EMERGENCY DEPARTMENT US SOFT TISSUE INTERPRETATION "Study: Limited Soft Tissue Ultrasound"  INDICATIONS: Abscess Multiple views of the body part were obtained in real-time with a multi-frequency linear probe PERFORMED BY:  Myself IMAGES  ARCHIVED?: Yes SIDE: Left  BODY PART: Lower extremity FINDINGS: 1 x 1 cm area of fluid in the subcutaneous tissue viewed in multiple planes.  INTERPRETATION: Positive abscess    CPT:  Lower extremity [id number]    ____________________________________________   INITIAL IMPRESSION / ASSESSMENT AND PLAN / ED COURSE  Pertinent labs & imaging results that were available during my care of the patient were reviewed by me and considered in my medical decision making (see chart for details).  Patient presents to the ED for evaluation of LLE edema, pain, and redness in the setting of recent I&D at outside ED. Not placed on abx after drainage. Patient denies continued drainage. No fever or other signs/symtpoms of systemic infection. Considered alternate diagnosis of DVT but feel this is far less likely with fluid collection visualized on bedside US documented above in a low risk patient. Plan for I&D with discharge home on abx. Patient is allergic to sulfa.   I&D performed. See attached procedure note. Will discharge with return precautions, PCP follow up for wound evaluation, and course of Doxycycline given sulfa allergy. Patient is pleased at discharge.    ____________________________________________  FINAL CLINICAL IMPRESSION(S) / ED DIAGNOSES  Final diagnoses:  Abscess     MEDICATIONS GIVEN DURING THIS VISIT:  Medications  lidocaine (PF) (XYLOCAINE) 1 % injection 20 mL (20 mLs Infiltration Given 10/03/15 1752)     NEW OUTPATIENT MEDICATIONS STARTED DURING THIS VISIT:  Discharge Medication List as of 10/03/2015  6:53 PM    START taking these medications   Details  doxycycline (VIBRAMYCIN) 100 MG capsule Take 1 capsule (100 mg total) by mouth 2 (two) times daily., Starting 10/03/2015, Until Discontinued, Print          Note:  This document was prepared using Dragon voice recognition software and may include unintentional dictation errors.  Nanda Quinton, MD Emergency  Medicine  Margette Fast, MD 10/04/15 1046

## 2015-10-03 NOTE — Discharge Instructions (Signed)
You have been seen in the Emergency Department (ED) today for an abscess.  This was drained in the ED. ° °Please follow up with your doctor or in the ED in 24-48 hours for recheck of your wound.  Read through the additional discharge instructions included below regarding wound care recommendations.  Keep the wound clean and dry, though you may wash as you would normally.  Change the dressing twice daily. ° °Call your doctor sooner or return to the ED if you develop worsening signs of infection such as: increased redness, increased pain, pus, or fever. ° ° °Abscess °An abscess is an infected area that contains a collection of pus and debris. It can occur in almost any part of the body. An abscess is also known as a furuncle or boil. °CAUSES  °An abscess occurs when tissue gets infected. This can occur from blockage of oil or sweat glands, infection of hair follicles, or a minor injury to the skin. As the body tries to fight the infection, pus collects in the area and creates pressure under the skin. This pressure causes pain. People with weakened immune systems have difficulty fighting infections and get certain abscesses more often.  °SYMPTOMS °Usually an abscess develops on the skin and becomes a painful mass that is red, warm, and tender. If the abscess forms under the skin, you may feel a moveable soft area under the skin. Some abscesses break open (rupture) on their own, but most will continue to get worse without care. The infection can spread deeper into the body and eventually into the bloodstream, causing you to feel ill.  °DIAGNOSIS  °Your caregiver will take your medical history and perform a physical exam. A sample of fluid may also be taken from the abscess to determine what is causing your infection. °TREATMENT  °Your caregiver may prescribe antibiotic medicines to fight the infection. However, taking antibiotics alone usually does not cure an abscess. Your caregiver may need to make a small cut  (incision) in the abscess to drain the pus. In some cases, gauze is packed into the abscess to reduce pain and to continue draining the area. °HOME CARE INSTRUCTIONS  °Only take over-the-counter or prescription medicines for pain, discomfort, or fever as directed by your caregiver. °If you were prescribed antibiotics, take them as directed. Finish them even if you start to feel better. °If gauze is used, follow your caregiver's directions for changing the gauze. °To avoid spreading the infection: °Keep your draining abscess covered with a bandage. °Wash your hands well. °Do not share personal care items, towels, or whirlpools with others. °Avoid skin contact with others. °Keep your skin and clothes clean around the abscess. °Keep all follow-up appointments as directed by your caregiver. °SEEK MEDICAL CARE IF:  °You have increased pain, swelling, redness, fluid drainage, or bleeding. °You have muscle aches, chills, or a general ill feeling. °You have a fever. °MAKE SURE YOU:  °Understand these instructions. °Will watch your condition. °Will get help right away if you are not doing well or get worse. °Document Released: 12/19/2004 Document Revised: 09/10/2011 Document Reviewed: 05/24/2011 °ExitCare® Patient Information ©2015 [employer]. This information is not intended to replace advice given to you by your health care provider. Make sure you discuss any questions you have with your health care provider. ° °Abscess °Care After °An abscess (also called a boil or furuncle) is an infected area that contains a collection of pus. Signs and symptoms of an abscess include pain, tenderness, redness, or hardness,   or you may feel a moveable soft area under your skin. An abscess can occur anywhere in the body. The infection may spread to surrounding tissues causing cellulitis. A cut (incision) by the surgeon was made over your abscess and the pus was drained out. Gauze may have been packed into the space to provide a drain  that will allow the cavity to heal from the inside outwards. The boil may be painful for 5 to 7 days. Most people with a boil do not have high fevers. Your abscess, if seen early, may not have localized, and may not have been lanced. If not, another appointment may be required for this if it does not get better on its own or with medications. °HOME CARE INSTRUCTIONS  °Only take over-the-counter or prescription medicines for pain, discomfort, or fever as directed by your caregiver. °When you bathe, soak and then remove gauze or iodoform packs at least daily or as directed by your caregiver. You may then wash the wound gently with mild soapy water. Repack with gauze or do as your caregiver directs. °SEEK IMMEDIATE MEDICAL CARE IF:  °You develop increased pain, swelling, redness, drainage, or bleeding in the wound site. °You develop signs of generalized infection including muscle aches, chills, fever, or a general ill feeling. °An oral temperature above 102° F (38.9° C) develops, not controlled by medication. °See your caregiver for a recheck if you develop any of the symptoms described above. If medications (antibiotics) were prescribed, take them as directed. °Document Released: 09/27/2004 Document Revised: 06/03/2011 Document Reviewed: 05/25/2007 °ExitCare® Patient Information ©2015 [employer]. This information is not intended to replace advice given to you by your health care provider. Make sure you discuss any questions you have with your health care provider. ° °Cellulitis °Cellulitis is an infection of the skin and the tissue beneath it. The infected area is usually red and tender. Cellulitis occurs most often in the arms and lower legs.  °CAUSES  °Cellulitis is caused by bacteria that enter the skin through cracks or cuts in the skin. The most common types of bacteria that cause cellulitis are staphylococci and streptococci. °SIGNS AND SYMPTOMS  °Redness and warmth. °Swelling. °Tenderness or  pain. °Fever. °DIAGNOSIS  °Your health care provider can usually determine what is wrong based on a physical exam. Blood tests may also be done. °TREATMENT  °Treatment usually involves taking an antibiotic medicine. °HOME CARE INSTRUCTIONS  °Take your antibiotic medicine as directed by your health care provider. Finish the antibiotic even if you start to feel better. °Keep the infected arm or leg elevated to reduce swelling. °Apply a warm cloth to the affected area up to 4 times per day to relieve pain. °Take medicines only as directed by your health care provider. °Keep all follow-up visits as directed by your health care provider. °SEEK MEDICAL CARE IF:  °You notice red streaks coming from the infected area. °Your red area gets larger or turns dark in color. °Your bone or joint underneath the infected area becomes painful after the skin has healed. °Your infection returns in the same area or another area. °You notice a swollen bump in the infected area. °You develop new symptoms. °You have a fever. °SEEK IMMEDIATE MEDICAL CARE IF:  °You feel very sleepy. °You develop vomiting or diarrhea. °You have a general ill feeling (malaise) with muscle aches and pains. °MAKE SURE YOU:  °Understand these instructions. °Will watch your condition. °Will get help right away if you are not doing well or get   worse. °Document Released: 12/19/2004 Document Revised: 07/26/2013 Document Reviewed: 05/27/2011 °ExitCare® Patient Information ©2015 [employer]. This information is not intended to replace advice given to you by your health care provider. Make sure you discuss any questions you have with your health care provider. ° ° ° °

## 2015-11-13 ENCOUNTER — Ambulatory Visit (HOSPITAL_COMMUNITY)
Admission: RE | Admit: 2015-11-13 | Discharge: 2015-11-13 | Disposition: A | Discharge status: Home / Self Care | Payer: BLUE CROSS/BLUE SHIELD | Source: Ambulatory Visit | Attending: Obstetrics and Gynecology | Admitting: Obstetrics and Gynecology

## 2015-11-13 ENCOUNTER — Ambulatory Visit (INDEPENDENT_AMBULATORY_CARE_PROVIDER_SITE_OTHER): Payer: BLUE CROSS/BLUE SHIELD | Admitting: Obstetrics and Gynecology

## 2015-11-13 ENCOUNTER — Encounter: Payer: Self-pay | Admitting: Obstetrics and Gynecology

## 2015-11-13 VITALS — BP 140/90 | HR 77 | Temp 98.1°F | Ht 64.0 in | Wt 240.0 lb

## 2015-11-13 DIAGNOSIS — Z8632 Personal history of gestational diabetes: Secondary | ICD-10-CM | POA: Diagnosis not present

## 2015-11-13 DIAGNOSIS — M7989 Other specified soft tissue disorders: Secondary | ICD-10-CM

## 2015-11-13 LAB — GLUCOSE, CAPILLARY: GLUCOSE-CAPILLARY: 98 mg/dL (ref 65–99)

## 2015-11-13 NOTE — Progress Notes (Signed)
Subjective:     Lisa Sullivan is a 50 y.o. female who presents for evaluation of swelling and pain of the left leg. Symptoms have been present for 2 days. She has not had similar problems in the past. The patient is able to ambulate. Risk factors for hypercoagulable state include: obesity, varicose veins and Smoking.  The following portions of the patient's history were reviewed and updated as appropriate: past family history, past medical history, past social history and past surgical history.  Review of Systems Review of Systems  Constitutional: Negative for chills and fever.  HENT: Negative for congestion.   Eyes: Negative for blurred vision and double vision.  Respiratory: Negative for cough and sputum production.   Cardiovascular: Positive for leg swelling. Negative for chest pain, palpitations, orthopnea and claudication.  Gastrointestinal: Negative for abdominal pain, heartburn, nausea and vomiting.  Musculoskeletal: Negative for back pain, falls and myalgias.  Skin: Negative for itching and rash.  Neurological: Negative for dizziness and headaches.  Endo/Heme/Allergies: Negative for environmental allergies. Does not bruise/bleed easily.    Objective:     Physical Exam  Constitutional: She is oriented to person, place, and time and well-developed, well-nourished, and in no distress.  HENT:  Head: Normocephalic and atraumatic.  Cardiovascular: Normal rate and regular rhythm.   Pulmonary/Chest: Effort normal and breath sounds normal. No respiratory distress.  Abdominal: Soft. Bowel sounds are normal. She exhibits no distension.  Musculoskeletal:  Significant swelling of the left upper calf knee and lower thigh. Tenderness to palpation over the posterior thigh moderately positive Homans sign. Negative drawer test no joint line tenderness and some tenderness on the posterior aspect of the knee.  Neurological: She is alert and oriented to person, place, and time.  Skin: Skin is  warm and dry. No erythema.   Assessment:   Lower extremity swelling concern for DVT versus Baker's cyst  History of recent steroid use and history of gestational diabetes   Plan:    Patient sent for stat ultrasound of the left lower extremity. Will rate results. If positive for DVT Will start patient on XAralto this evening if negative will refer for steroid injection.    Patient is concerned that she may have elevated glucoses with history of gestational diabetes and steroid use. We'll check a glucose here in the office. It was less than 100. Reassured patient.  Update: 11/15/2015 Patient had ultrasound which was negative for both Baker's cyst and DVT. Discussed with patient that she can use ibuprofen for pain and she should give it some rest and time to see if it improves if not improving in a week patient can call for follow-up visit or referral to specialist.

## 2015-11-13 NOTE — Progress Notes (Signed)
**  Preliminary report by tech**  Left lower extremity venous duplex completed. There is no evidence of deep or superficial vein thrombosis involving the left lower extremity. All visualized vessels appear patent and compressible. There is no evidence of a Baker's cyst on the left. Negative results were given to after hours messaging service member, Cyril Mourning, who will give the message to the office.  11/13/15 5:02 PM Lisa Sullivan RVT

## 2015-11-13 NOTE — Patient Instructions (Addendum)
Please get your [place] of your left leg done today. We will call you with your results.   Deep Vein Thrombosis A deep vein thrombosis (DVT) is a blood clot (thrombus) that usually occurs in a deep, larger vein of the lower leg or the pelvis, or in an upper extremity such as the arm. These are dangerous and can lead to serious and even life-threatening complications if the clot travels to the lungs. A DVT can damage the valves in your leg veins so that instead of flowing upward, the blood pools in the lower leg. This is called post-thrombotic syndrome, and it can result in pain, swelling, discoloration, and sores on the leg. CAUSES A DVT is caused by the formation of a blood clot in your leg, pelvis, or arm. Usually, several things contribute to the formation of blood clots. A clot may develop when:  Your blood flow slows down.  Your vein becomes damaged in some way.  You have a condition that makes your blood clot more easily. RISK FACTORS A DVT is more likely to develop in:  People who are older, especially over 73 years of age.  People who are overweight (obese).  People who sit or lie still for a long time, such as during long-distance travel (over 4 hours), bed rest, hospitalization, or during recovery from certain medical conditions like a stroke.  People who do not engage in much physical activity (sedentary lifestyle).  People who have chronic breathing disorders.  People who have a personal or family history of blood clots or blood clotting disease.  People who have peripheral vascular disease (PVD), diabetes, or some types of cancer.  People who have heart disease, especially if the person had a recent heart attack or has congestive heart failure.  People who have neurological diseases that affect the legs (leg paresis).  People who have had a traumatic injury, such as breaking a hip or leg.  People who have recently had major or lengthy surgery, especially on the hip, knee,  or abdomen.  People who have had a central line placed inside a large vein.  People who take medicines that contain the hormone estrogen. These include birth control pills and hormone replacement therapy.  Pregnancy or during childbirth or the postpartum period.  Long plane flights (over 8 hours). SIGNS AND SYMPTOMS Symptoms of a DVT can include:   Swelling of your leg or arm, especially if one side is much worse.  Warmth and redness of your leg or arm, especially if one side is much worse.  Pain in your arm or leg. If the clot is in your leg, symptoms may be more noticeable or worse when you stand or walk.  A feeling of pins and needles, if the clot is in the arm. The symptoms of a DVT that has traveled to the lungs (pulmonary embolism, PE) usually start suddenly and include:  Shortness of breath while active or at rest.  Coughing or coughing up blood or blood-tinged mucus.  Chest pain that is often worse with deep breaths.  Rapid or irregular heartbeat.  Feeling light-headed or dizzy.  Fainting.  Feeling anxious.  Sweating. There may also be pain and swelling in a leg if that is where the blood clot started. These symptoms may represent a serious problem that is an emergency. Do not wait to see if the symptoms will go away. Get medical help right away. Call your local emergency services (911 in the [place] Do not drive yourself to the  hospital. DIAGNOSIS Your health care provider will take a medical history and perform a physical exam. You may also have other tests, including:  Blood tests to assess the clotting properties of your blood.  Imaging tests, such as CT, ultrasound, MRI, X-ray, and other tests to see if you have clots anywhere in your body. TREATMENT After a DVT is identified, it can be treated. The type of treatment that you receive depends on many factors, such as the cause of your DVT, your risk for bleeding or developing more clots, and other medical  conditions that you have. Sometimes, a combination of treatments is necessary. Treatment options may be combined and include:  Monitoring the blood clot with ultrasound.  Taking medicines by mouth, such as newer blood thinners (anticoagulants), thrombolytics, or warfarin.  Taking anticoagulant medicine by injection or through an IV tube.  Wearing compression stockings or using different types ofdevices.  Surgery (rare) to remove the blood clot or to place a filter in your abdomen to stop the blood clot from traveling to your lungs. Treatments for a DVT are often divided into immediate treatment and long-term treatment (up to 3 months after DVT). You can work with your health care provider to choose the treatment program that is best for you. HOME CARE INSTRUCTIONS If you are taking a newer oral anticoagulant:  Take the medicine every single day at the same time each day.  Understand what foods and drugs interact with this medicine.  Understand that there are no regular blood tests required when using this medicine.  Understand the side effects of this medicine, including excessive bruising or bleeding. Ask your health care provider or pharmacist about other possible side effects. If you are taking warfarin:  Understand how to take warfarin and know which foods can affect how warfarin works in Veterinary surgeon.  Understand that it is dangerous to take too much or too little warfarin. Too much warfarin increases the risk of bleeding. Too little warfarin continues to allow the risk for blood clots.  Follow your PT and INR blood testing schedule. The PT and INR results allow your health care provider to adjust your dose of warfarin. It is very important that you have your PT and INR tested as often as told by your health care provider.  Avoid major changes in your diet, or tell your health care provider before you change your diet. Arrange a visit with a registered dietitian to answer your  questions. Many foods, especially foods that are high in vitamin K, can interfere with warfarin and affect the PT and INR results. Eat a consistent amount of foods that are high in vitamin K, such as:  Spinach, kale, broccoli, cabbage, collard greens, turnip greens, Brussels sprouts, peas, cauliflower, seaweed, and parsley.  Beef liver and pork liver.  Green tea.  Soybean oil.  Tell your health care provider about any and all medicines, vitamins, and supplements that you take, including aspirin and other over-the-counter anti-inflammatory medicines. Be especially cautious with aspirin and anti-inflammatory medicines. Do not take those before you ask your health care provider if it is safe to do so. This is important because many medicines can interfere with warfarin and affect the PT and INR results.  Do not start or stop taking any over-the-counter or prescription medicine unless your health care provider or pharmacist tells you to do so. If you take warfarin, you will also need to do these things:  Hold pressure over cuts for longer than usual.  Tell  your dentist and other health care providers that you are taking warfarin before you have any procedures in which bleeding may occur.  Avoid alcohol or drink very small amounts. Tell your health care provider if you change your alcohol intake.  Do not use tobacco products, including cigarettes, chewing tobacco, and e-cigarettes. If you need help quitting, ask your health care provider.  Avoid contact sports. General Instructions  Take over-the-counter and prescription medicines only as told by your health care provider. Anticoagulant medicines can have side effects, including easy bruising and difficulty stopping bleeding. If you are prescribed an anticoagulant, you will also need to do these things:  Hold pressure over cuts for longer than usual.  Tell your dentist and other health care providers that you are taking anticoagulants before  you have any procedures in which bleeding may occur.  Avoid contact sports.  Wear a medical alert bracelet or carry a medical alert card that says you have had a PE.  Ask your health care provider how soon you can go back to your normal activities. Stay active to prevent new blood clots from forming.  Make sure to exercise while traveling or when you have been sitting or standing for a long period of time. It is very important to exercise. Exercise your legs by walking or by tightening and relaxing your leg muscles often. Take frequent walks.  Wear compression stockings as told by your health care provider to help prevent more blood clots from forming.  Do not use tobacco products, including cigarettes, chewing tobacco, and e-cigarettes. If you need help quitting, ask your health care provider.  Keep all follow-up appointments with your health care provider. This is important. PREVENTION Take these actions to decrease your risk of developing another DVT:  Exercise regularly. For at least 30 minutes every day, engage in:  Activity that involves moving your arms and legs.  Activity that encourages good blood flow through your body by increasing your heart rate.  Exercise your arms and legs every hour during long-distance travel (over 4 hours). Drink plenty of water and avoid drinking alcohol while traveling.  Avoid sitting or lying in bed for long periods of time without moving your legs.  Maintain a weight that is appropriate for your height. Ask your health care provider what weight is healthy for you.  If you are a woman who is over 25 years of age, avoid unnecessary use of medicines that contain estrogen. These include birth control pills.  Do not smoke, especially if you take estrogen medicines. If you need help quitting, ask your health care provider. If you are hospitalized, prevention measures may include:  Early walking after surgery, as soon as your health care provider  says that it is safe.  Receiving anticoagulants to prevent blood clots.If you cannot take anticoagulants, other options may be available, such as wearing compression stockings or using different types of devices. SEEK IMMEDIATE MEDICAL CARE IF:  You have new or increased pain, swelling, or redness in an arm or leg.  You have numbness or tingling in an arm or leg.  You have shortness of breath while active or at rest.  You have chest pain.  You have a rapid or irregular heartbeat.  You feel light-headed or dizzy.  You cough up blood.  You notice blood in your vomit, bowel movement, or urine. These symptoms may represent a serious problem that is an emergency. Do not wait to see if the symptoms will go away. Get medical help  right away. Call your local emergency services (911 in the [place] Do not drive yourself to the hospital.   This information is not intended to replace advice given to you by your health care provider. Make sure you discuss any questions you have with your health care provider.   Document Released: 03/11/2005 Document Revised: 11/30/2014 Document Reviewed: 07/06/2014 Elsevier Interactive Patient Education [employer].

## 2015-11-14 ENCOUNTER — Telehealth: Payer: Self-pay | Admitting: Family Medicine

## 2015-11-14 NOTE — Telephone Encounter (Signed)
Would like results from yesterdays ultrasound on her leg. Please advise

## 2015-11-14 NOTE — Telephone Encounter (Signed)
Will forward to Dr. Baron Sane who saw patient for this concern. Deashia Soule,CMA

## 2015-11-15 NOTE — Telephone Encounter (Signed)
Discussed results with patient. Informed of negative DVT and Baker's cyst. Advised ibuprofen and rest. Follow-up in 1 week if not improving.

## 2015-11-15 NOTE — Telephone Encounter (Signed)
Attempted to reach pt. Left message for pt to call back with best time and number to reach her.

## 2015-12-16 ENCOUNTER — Other Ambulatory Visit: Payer: Self-pay | Admitting: Family Medicine

## 2015-12-18 NOTE — Telephone Encounter (Signed)
Rx filled.  Algis Greenhouse. [name], [facility] Resident PGY-3 12/18/2015 2:20 PM

## 2016-05-23 ENCOUNTER — Other Ambulatory Visit: Payer: Self-pay | Admitting: Family Medicine

## 2016-05-23 ENCOUNTER — Other Ambulatory Visit: Payer: Self-pay | Admitting: Internal Medicine

## 2016-05-24 NOTE — Telephone Encounter (Signed)
30 day supply given. Patient needs office visit.  Algis Greenhouse. [name], [facility] Resident PGY-3 05/24/2016 1:51 PM

## 2016-06-03 ENCOUNTER — Ambulatory Visit: Payer: BLUE CROSS/BLUE SHIELD | Admitting: Family Medicine

## 2016-06-18 ENCOUNTER — Ambulatory Visit (INDEPENDENT_AMBULATORY_CARE_PROVIDER_SITE_OTHER): Payer: BLUE CROSS/BLUE SHIELD | Admitting: Family Medicine

## 2016-06-18 ENCOUNTER — Encounter: Payer: Self-pay | Admitting: Family Medicine

## 2016-06-18 ENCOUNTER — Ambulatory Visit (HOSPITAL_COMMUNITY)
Admission: RE | Admit: 2016-06-18 | Discharge: 2016-06-18 | Disposition: A | Discharge status: Home / Self Care | Payer: BLUE CROSS/BLUE SHIELD | Source: Ambulatory Visit | Attending: Family Medicine | Admitting: Family Medicine

## 2016-06-18 VITALS — BP 128/62 | HR 73 | Temp 98.1°F | Ht 64.0 in | Wt 228.6 lb

## 2016-06-18 DIAGNOSIS — R001 Bradycardia, unspecified: Secondary | ICD-10-CM | POA: Insufficient documentation

## 2016-06-18 DIAGNOSIS — R7303 Prediabetes: Secondary | ICD-10-CM

## 2016-06-18 DIAGNOSIS — R079 Chest pain, unspecified: Secondary | ICD-10-CM | POA: Insufficient documentation

## 2016-06-18 DIAGNOSIS — R739 Hyperglycemia, unspecified: Secondary | ICD-10-CM | POA: Diagnosis not present

## 2016-06-18 DIAGNOSIS — I517 Cardiomegaly: Secondary | ICD-10-CM | POA: Diagnosis not present

## 2016-06-18 DIAGNOSIS — I10 Essential (primary) hypertension: Secondary | ICD-10-CM | POA: Diagnosis not present

## 2016-06-18 DIAGNOSIS — E119 Type 2 diabetes mellitus without complications: Secondary | ICD-10-CM | POA: Insufficient documentation

## 2016-06-18 LAB — POCT GLYCOSYLATED HEMOGLOBIN (HGB A1C): Hemoglobin A1C: 6.3

## 2016-06-18 MED ORDER — METFORMIN HCL ER 750 MG PO TB24: 750.0000 mg | ORAL_TABLET | Freq: Every day | ORAL | 11 refills | Status: DC

## 2016-06-18 NOTE — Progress Notes (Signed)
   Subjective:  Lisa Sullivan is a 51 y.o. female who presents to the [facility] today with a chief complaint of chest discomfort.   HPI:  Chest Discomfort Symptoms started a few weeks ago. She has a "discomfort" in the center of her chest that occurs randomly. Sometimes it will feel like sharp pains. Pains come and go. She is able to climb 3 flights of stairs without issues. Sometimes pain gets better after drinking. No shortness of breath. Patient's sister recently died with heart disease.   Hypertension BP Readings from Last 3 Encounters:  06/18/16 128/62  11/13/15 140/90  10/03/15 127/82   Home BP monitoring-No Compliant with medications-yes, without side effects ROS-Denies any HA, SOB, blurry vision, LE edema, transient weakness, orthopnea, PND.   Prediabetes Patient not currently on any medications. Regularly drinks soda and other sweetened beverages. Does not exercise regularly. No polydipsia or polyuria.   Grief Reaction Patient's sister suddenly passed away about 6 weeks ago. She and her family have had difficulty with this. She has a lot of support at home, but is interested in talking to someone further.   ROS: Per HPI  PMH: Smoking history reviewed.   Objective:  Physical Exam: BP 128/62   Pulse 73   Temp 98.1 F (36.7 C) (Oral)   Ht 5\' 4"  (1.626 m)   Wt 228 lb 9.6 oz (103.7 kg)   SpO2 97%   BMI 39.24 kg/m   Gen: NAD, resting comfortably CV: RRR with no murmurs appreciated Pulm: NWOB, CTAB with no crackles, wheezes, or rhonchi GI: Obese, Normal bowel sounds present. Soft, Nontender, Nondistended. MSK: no edema, cyanosis, or clubbing noted Skin: warm, dry Neuro: grossly normal, moves all extremities Psych: Normal affect and thought content  Results for orders placed or performed in visit on 06/18/16 (from the past 72 hour(s))  HgB A1c     Status: None   Collection Time: 06/18/16  4:23 PM  Result Value Ref Range   Hemoglobin A1C 6.3    EKG: NSR, No acute  ischemic changes.   Assessment/Plan:  Hypertension At goal. Continue HCTZ.   Prediabetes A1c 6.3. Discussed interventions with patient. Will start metformin. Follow up in 3 months for repeat A1c. Stressed lifestyle modifications.   Chest Pain Atypical in nature. Likely GI related given that it improves with drinking, however patient does have risk factors for cardiac disease. EKG today negative. Will send for stress testing to rule out CAD.   Grief Reaction Did not have time to fully address in today's visit. Gave contact information for [facility].   Algis Greenhouse. [name], [place] Resident PGY-3 06/18/2016 5:13 PM

## 2016-06-18 NOTE — Patient Instructions (Signed)
We will set you up to have a stress test.  Please start the metformin.  Come back to see me in 3 months.  We can check blood work then.  Take care,  Dr [name]  BEHAVIORAL HEALTH CONSULTANT [facility] Your primary care provider may refer you to a [place] Consultant for a 15-30 minute visit. [facility] will focus on a particular problem.  After talking to you, the [facility] will help you make any changes you want to make centered around your health. [facility] can help you with: .             Difficult life problems .             Stress, depression or anxiety .             Coping with medical problems .             Reflect on harmful habits (alcohol, tobacco and drugs),  .             Learning relaxation skills  .             Sleep difficulties  .             Mental health concerns  Call [phone] to schedule an appointment

## 2016-06-18 NOTE — Assessment & Plan Note (Signed)
A1c 6.3. Discussed interventions with patient. Will start metformin. Follow up in 3 months for repeat A1c. Stressed lifestyle modifications.

## 2016-06-18 NOTE — Assessment & Plan Note (Signed)
At goal. Continue HCTZ.

## 2016-08-08 ENCOUNTER — Emergency Department (HOSPITAL_BASED_OUTPATIENT_CLINIC_OR_DEPARTMENT_OTHER): Payer: BLUE CROSS/BLUE SHIELD

## 2016-08-08 ENCOUNTER — Emergency Department (HOSPITAL_BASED_OUTPATIENT_CLINIC_OR_DEPARTMENT_OTHER)
Admission: EM | Admit: 2016-08-08 | Discharge: 2016-08-08 | Disposition: A | Discharge status: Home / Self Care | Payer: BLUE CROSS/BLUE SHIELD | Attending: Emergency Medicine | Admitting: Emergency Medicine

## 2016-08-08 ENCOUNTER — Encounter (HOSPITAL_BASED_OUTPATIENT_CLINIC_OR_DEPARTMENT_OTHER): Payer: Self-pay | Admitting: *Deleted

## 2016-08-08 DIAGNOSIS — I10 Essential (primary) hypertension: Secondary | ICD-10-CM | POA: Diagnosis not present

## 2016-08-08 DIAGNOSIS — Z91040 Latex allergy status: Secondary | ICD-10-CM | POA: Diagnosis not present

## 2016-08-08 DIAGNOSIS — F17200 Nicotine dependence, unspecified, uncomplicated: Secondary | ICD-10-CM | POA: Diagnosis not present

## 2016-08-08 DIAGNOSIS — Z79899 Other long term (current) drug therapy: Secondary | ICD-10-CM | POA: Diagnosis not present

## 2016-08-08 DIAGNOSIS — M25461 Effusion, right knee: Secondary | ICD-10-CM

## 2016-08-08 DIAGNOSIS — M25561 Pain in right knee: Secondary | ICD-10-CM

## 2016-08-08 MED ORDER — HYDROCODONE-ACETAMINOPHEN 5-325 MG PO TABS
1.0000 | ORAL_TABLET | Freq: Once | ORAL | Status: AC
Start: 2016-08-08 — End: 2016-08-08
  Administered 2016-08-08: 1 via ORAL
  Filled 2016-08-08: qty 1

## 2016-08-08 MED ORDER — TRAMADOL HCL 50 MG PO TABS: 50.0000 mg | ORAL_TABLET | Freq: Four times a day (QID) | ORAL | 0 refills | Status: DC | PRN

## 2016-08-08 MED FILL — traMADol HCL 50 MG TABS: 50 | 2 days supply | Qty: 15 | Fill #0

## 2016-08-08 NOTE — ED Provider Notes (Signed)
Complains of right knee pain onset 2 days ago gradually pain is worse with flexing her knee improved with extending her knee. No fever no injury no other associated symptoms. On exam patient is alert Glasgow Coma Score 15. Treated herself with tramadol 1 tablet prior to coming here. On exam appears mildly uncomfortable. Alert Glasgow Coma Score 15 Right lower extremity not red or warm or swollen. Knee is mildly diffusely tender. DP pulse 2+. Good capillary refill   Orlie Dakin, MD 08/08/16 [phone]

## 2016-08-08 NOTE — ED Provider Notes (Signed)
[facility] DEPT [facility] Provider Note   CSN: [id number] Arrival date & time: 08/08/16  4098     History   Chief Complaint Chief Complaint  Patient presents with  . Knee Pain    HPI Lisa Sullivan is a 51 y.o. female.  HPI   Pt c/o atraumatic right knee pain that began yesterday.  Pain is constant, worse with flexing knee, walking.  Describes as "knots" inside it.  Also pain in right lateral ankle with standing only.  Denies fevers, chills, maylgias, other joints bothering her, recent infections ,weakness or numbness of the leg.  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   Took tramadol, ibuprofen, and "inflame pill" without improvement.   Past Medical History:  Diagnosis Date  . Gall stone   . Hypertension     Patient Active Problem List   Diagnosis Date Noted  . Prediabetes 06/18/2016  . Left knee pain 04/26/2015  . Depression 01/23/2015  . Headache 12/15/2014  . Healthcare maintenance 12/15/2014  . Unspecified constipation 06/10/2013  . Hypertension 02/28/2011  . Goiter 02/28/2011  . OBESITY, UNSPECIFIED 03/27/2010  . SMOKER 03/27/2010  . 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 CESAREAN SECTION    . TUBAL LIGATION      OB History    No data available       Home Medications    Prior to Admission medications   Medication Sig Start Date End Date Taking? Authorizing Provider  citalopram (CELEXA) 20 MG tablet TAKE 1 TABLET BY MOUTH DAILY 05/24/16   Vivi Barrack, MD  clobetasol cream (TEMOVATE) 1.19 % Apply 1 application topically 2 (two) times daily. On hands. 09/06/15   [provider]  hydrochlorothiazide (MICROZIDE) 12.5 MG capsule TAKE 2 CAPSULES BY MOUTH EVERY MORNING 12/18/15   Vivi Barrack, MD  levocetirizine (XYZAL) 5 MG tablet Take 1 tablet (5 mg total) by mouth every evening. 12/15/14   Vivi Barrack, MD  metFORMIN (GLUCOPHAGE XR) 750 MG 24 hr  tablet Take 1 tablet (750 mg total) by mouth daily with breakfast. 06/18/16   Vivi Barrack, MD  pantoprazole (PROTONIX) 40 MG tablet TAKE 1 TABLET BY MOUTH DAILY 12/18/15   Vivi Barrack, MD  traMADol (ULTRAM) 50 MG tablet Take 1-2 tablets (50-100 mg total) by mouth every 6 (six) hours as needed for severe pain. 08/08/16   Clayton Bibles, PA-C  Zoster Vaccine Live, PF, (ZOSTAVAX) 14782 UNT/0.65ML injection Inject 19,400 Units into the skin once. 08/23/15   Veatrice Bourbon, MD    Family History Family History  Problem Relation Age of Onset  . Diabetes Mother   . Stroke Father   . Colon cancer Neg Hx   . Esophageal cancer Neg Hx   . Stomach cancer Neg Hx     Social History Social History  Substance Use Topics  . Smoking status: Current Some Day Smoker    Packs/day: 0.50  . Smokeless tobacco: Never Used  . Alcohol use Yes     Comment: socially      Allergies   Bee venom; Latex; and Sulfa antibiotics   Review of Systems Review of Systems  Constitutional: Negative for chills and fever.  Cardiovascular: Negative for leg swelling.  Musculoskeletal: Positive for arthralgias. Negative for joint swelling.  Skin: Negative for color change.  Allergic/Immunologic: Negative for immunocompromised state.  Neurological: Negative for weakness and numbness.  Psychiatric/Behavioral: Negative for  self-injury.     Physical Exam Updated Vital Signs BP (!) 149/95 (BP Location: Right Arm)   Pulse (!) 58   Temp 98.1 F (36.7 C) (Oral)   Resp 18   Ht 5\' 4"  (1.626 m)   Wt 101.6 kg   SpO2 99%   BMI 38.45 kg/m   Physical Exam  Constitutional: She appears well-developed and well-nourished. No distress.  HENT:  Head: Normocephalic and atraumatic.  Neck: Neck supple.  Cardiovascular: Intact distal pulses.   Pulmonary/Chest: Effort normal.  Musculoskeletal: She exhibits no edema.       Right knee: She exhibits decreased range of motion and swelling. She exhibits no ecchymosis, no  deformity, no laceration and no erythema. Tenderness found.       Right ankle: Normal.       Right upper leg: Normal.       Right lower leg: Normal.  Right knee with diffuse tenderness. ?effusion.  No erythema.    Neurological: She is alert.  Skin: She is not diaphoretic.  Nursing note and vitals reviewed.    ED Treatments / Results  Labs (all labs ordered are listed, but only abnormal results are displayed) Labs Reviewed - No data to display  EKG  EKG Interpretation None       Radiology Dg Knee Complete 4 Views Right  Result Date: 08/08/2016 CLINICAL DATA:  Right knee pain and swelling for 2 days. EXAM: RIGHT KNEE - COMPLETE 4+ VIEW COMPARISON:  04/26/2015 FINDINGS: There is a moderate suprapatellar joint effusion. No underlying fracture or subluxation. There is mild medial compartment narrowing and marginal spur formation. IMPRESSION: 1. Suprapatellar joint effusion. 2. Mild medial compartment osteoarthritis. Electronically Signed   By: Kerby Moors M.D.   On: 08/08/2016 11:50    Procedures Procedures (including critical care time)  Medications Ordered in ED Medications  HYDROcodone-acetaminophen (NORCO/VICODIN) 5-325 MG per tablet 1 tablet (1 tablet Oral Given 08/08/16 1253)     Initial Impression / Assessment and Plan / ED Course  I have reviewed the triage vital signs and the nursing notes.  Pertinent labs & imaging results that were available during my care of the patient were reviewed by me and considered in my medical decision making (see chart for details).     Afebrile, nontoxic patient with pain in her right knee without injury.  No fevers, no systemic symptoms.  Right knee with suprpatellar effusion, chronic changes on xray.  Pt also seen and examined by Dr Winfred Leeds who recommends knee immobilizer, tramadol, sports medicine vs PCP follow up.  Doubt septic joint, gout.   D/C home with knee immobilizer, tramadol, PCP or sports medicine follow up, crutches.   Discussed result, findings, treatment, and follow up  with patient.  Pt given return precautions.  Pt verbalizes understanding and agrees with plan.       Final Clinical Impressions(s) / ED Diagnoses   Final diagnoses:  Acute pain of right knee  Knee effusion, right  Hypertension, unspecified type    New Prescriptions New Prescriptions   TRAMADOL (ULTRAM) 50 MG TABLET    Take 1-2 tablets (50-100 mg total) by mouth every 6 (six) hours as needed for severe pain.     Clayton Bibles, PA-C 08/08/16 1314    Orlie Dakin, MD 08/08/16 1640

## 2016-08-08 NOTE — ED Triage Notes (Signed)
Pt reports right knee pain x yesterday denies any injury or trauma.

## 2016-08-08 NOTE — Discharge Instructions (Signed)
Read the information below.  Use the prescribed medication as directed.  Please discuss all new medications with your pharmacist.  You may return to the Emergency Department at any time for worsening condition or any new symptoms that concern you.     If you develop uncontrolled pain, weakness or numbness of the extremity, severe discoloration of the skin, or you are unable to walk or move your knee, return to the ER for a recheck.    °

## 2016-08-13 ENCOUNTER — Encounter: Payer: Self-pay | Admitting: Family Medicine

## 2016-08-13 ENCOUNTER — Ambulatory Visit (INDEPENDENT_AMBULATORY_CARE_PROVIDER_SITE_OTHER): Payer: BLUE CROSS/BLUE SHIELD | Admitting: Family Medicine

## 2016-08-13 VITALS — BP 168/95 | HR 73 | Ht 64.0 in | Wt 224.0 lb

## 2016-08-13 DIAGNOSIS — M25561 Pain in right knee: Secondary | ICD-10-CM | POA: Diagnosis not present

## 2016-08-13 DIAGNOSIS — M25461 Effusion, right knee: Secondary | ICD-10-CM | POA: Diagnosis not present

## 2016-08-13 MED ORDER — HYDROCODONE-ACETAMINOPHEN 5-325 MG PO TABS: 1.0000 | ORAL_TABLET | Freq: Four times a day (QID) | ORAL | 0 refills | Status: DC | PRN

## 2016-08-13 MED ORDER — METHYLPREDNISOLONE ACETATE 40 MG/ML IJ SUSP
40.0000 mg | Freq: Once | INTRAMUSCULAR | Status: AC
  Administered 2016-08-13: 40 mg via INTRA_ARTICULAR

## 2016-08-13 MED FILL — HYDROCODON-APAP 5-325: 5-325 | 5 days supply | Qty: 20 | Fill #0

## 2016-08-13 NOTE — Patient Instructions (Signed)
We aspirated and injected your knee today. Call me in a week to let me know how you're doing. Take aleve 2 tabs twice a day with food for pain and inflammation. Norco as needed for severe pain (no driving on this medicine). Ice 15 minutes at a time 3-4 times a day. Consider knee sleeve for compression. Elevate above level of your heart to help as well.

## 2016-08-14 LAB — SYNOVIAL FLUID, CELL COUNT
Eos, Fluid: 0 %
Lining Cells, Synovial: 0 %
Lymphs, Fluid: 7 %
Macrophages Fld: 69 %
NUC CELL # FLD: 171 {cells}/uL (ref 0–200)
Polys, Fluid: 24 %

## 2016-08-16 DIAGNOSIS — M25561 Pain in right knee: Secondary | ICD-10-CM | POA: Insufficient documentation

## 2016-08-16 NOTE — Assessment & Plan Note (Signed)
independently reviewed radiographs and mild arthritis noted without other abnormalities.  Pain severe, effusion confirmed with ultrasound.  Suspected gout or pseudogout but fluid is clear - will send for analysis.  Cortisone injection given as well.  Aleve as needed with norco as needed for severe pain.  Icing, consider sleeve for compression.  Call us in a week to let us know how she's doing.  After informed written consent patient was lying supine on exam table.  Right knee was prepped with alcohol swab.  Utilizing superolateral approach, 3 mL of bupivicaine was used for local anesthesia.  Then using an 18g needle on 60cc syringe, 30 mL of clear straw-colored fluid was aspirated from right knee.  Knee was then injected with 3:1 bupivicaine:depomedrol.  Patient tolerated procedure well without immediate complications

## 2016-08-16 NOTE — Progress Notes (Addendum)
PCP: Lisa Barrack, MD  Subjective:   HPI: Patient is a 51 y.o. female here for right knee pain.  Patient denies known injury or trauma. She reports for about 5 days she's had severe anterior right knee pain. No prior issues with this knee. Pain is 9/10, sharp. Difficulty bearing weight. No recent illness. No history of gout, pseudogout, rheumatoid arthritis. + swelling. Worse by end of day at work. No skin changes, numbness, fever.  Past Medical History:  Diagnosis Date  . Gall stone   . Hypertension     Current Outpatient Prescriptions on File Prior to Visit  Medication Sig Dispense Refill  . citalopram (CELEXA) 20 MG tablet TAKE 1 TABLET BY MOUTH DAILY 30 tablet 0  . clobetasol cream (TEMOVATE) 9.98 % Apply 1 application topically 2 (two) times daily. On hands.  0  . hydrochlorothiazide (MICROZIDE) 12.5 MG capsule TAKE 2 CAPSULES BY MOUTH EVERY MORNING 60 capsule 5  . levocetirizine (XYZAL) 5 MG tablet Take 1 tablet (5 mg total) by mouth every evening. 30 tablet 3  . metFORMIN (GLUCOPHAGE XR) 750 MG 24 hr tablet Take 1 tablet (750 mg total) by mouth daily with breakfast. 30 tablet 11  . pantoprazole (PROTONIX) 40 MG tablet TAKE 1 TABLET BY MOUTH DAILY 30 tablet 5  . Zoster Vaccine Live, PF, (ZOSTAVAX) 33825 UNT/0.65ML injection Inject 19,400 Units into the skin once. 1 each 0   No current facility-administered medications on file prior to visit.     Past Surgical History:  Procedure Laterality Date  . CESAREAN SECTION    . TUBAL LIGATION      Allergies  Allergen Reactions  . Bee Venom Anaphylaxis  . Latex Other (See Comments)    Irritates skin, thinks all gloves irritates skins  . Sulfa Antibiotics Hives    Social History   Social History  . Marital status: Single    Spouse name: N/A  . Number of children: N/A  . Years of education: N/A   Occupational History  . Not on file.   Social History Main Topics  . Smoking status: Current Some Day Smoker   Packs/day: 0.50  . Smokeless tobacco: Never Used  . Alcohol use Yes     Comment: socially   . Drug use: No  . Sexual activity: Not on file   Other Topics Concern  . Not on file   Social History Narrative  . No narrative on file    Family History  Problem Relation Age of Onset  . Diabetes Mother   . Stroke Father   . Colon cancer Neg Hx   . Esophageal cancer Neg Hx   . Stomach cancer Neg Hx     BP (!) 168/95   Pulse 73   Ht 5\' 4"  (1.626 m)   Wt 224 lb (101.6 kg)   BMI 38.45 kg/m   Review of Systems: See HPI above.     Objective:  Physical Exam:  Gen: NAD, comfortable in exam room  Right knee: Mod effusion.  No other gross deformity, ecchymoses.  No erythema.  Mild warmth. Diffuse anterior tenderness.  ROM 0 - 90 degrees. Negative ant/post drawers. Negative valgus/varus testing. Negative lachmanns. Pain with mcmurrays, apleys.  Negative patellar apprehension. NV intact distally.  Left knee: FROM without pain.   Assessment & Plan:  1. Right knee pain - independently reviewed radiographs and mild arthritis noted without other abnormalities.  Pain severe, effusion confirmed with ultrasound.  Suspected gout or pseudogout but fluid is  clear - will send for analysis.  Cortisone injection given as well.  Aleve as needed with norco as needed for severe pain.  Icing, consider sleeve for compression.  Call us in a week to let us know how she's doing.  After informed written consent patient was lying supine on exam table.  Right knee was prepped with alcohol swab.  Utilizing superolateral approach, 3 mL of bupivicaine was used for local anesthesia.  Then using an 18g needle on 60cc syringe, 30 mL of clear straw-colored fluid was aspirated from right knee.  Knee was then injected with 3:1 bupivicaine:depomedrol.  Patient tolerated procedure well without immediate complications  Addendum:  MRI reviewed and discussed with patient.  No evidence of meniscus tear.  Does have  degenerative changes, subchondral edema medially related to this.  Moderate effusion causing a lot of her pain, difficulty bending knee.  She also has a small loose body though these are not uncommon and may or may not contribute to her pain.  We discussed three options: repeat cortisone injection (advised this is unlikely to make much of a difference if first one did not), viscosupplementation, ortho referral to discuss arthroscopy.  Will go ahead with viscosupplementation but aspirate prior to first injection.  Discussed arthroscopy is uncertain to help with degenerative changes and sometimes loose fragment difficult to find but will consider if not improving with viscosupplementation.

## 2016-08-20 ENCOUNTER — Telehealth: Payer: Self-pay | Admitting: Family Medicine

## 2016-08-20 NOTE — Telephone Encounter (Signed)
If aspiration/injection did not help we need to go ahead with MRI at this point to assess for osteochondral defect, meniscus tear.  Thanks!

## 2016-08-20 NOTE — Telephone Encounter (Signed)
Patient calling to update provider that her right knee is still swollen and painful

## 2016-08-20 NOTE — Telephone Encounter (Signed)
Spoke to patient and told her that the next step would be to do a MRI. Patient would like to do MRI. Will set up.

## 2016-08-21 NOTE — Telephone Encounter (Signed)
Patient called to follow up on MRI status. Patient was informed that it could take a couple days to get authorization and that she would be followed up with an update when we have one

## 2016-08-23 NOTE — Addendum Note (Signed)
Addended by: Sherrie George F on: 08/23/2016 08:43 AM   Modules accepted: Orders

## 2016-08-23 NOTE — Addendum Note (Signed)
Addended by: Sherrie George F on: 08/23/2016 09:05 AM   Modules accepted: Orders

## 2016-09-01 ENCOUNTER — Ambulatory Visit
Admission: RE | Admit: 2016-09-01 | Discharge: 2016-09-01 | Disposition: A | Discharge status: Home / Self Care | Payer: BLUE CROSS/BLUE SHIELD | Source: Ambulatory Visit | Attending: Family Medicine | Admitting: Family Medicine

## 2016-09-01 DIAGNOSIS — M25561 Pain in right knee: Secondary | ICD-10-CM

## 2016-09-01 DIAGNOSIS — M25461 Effusion, right knee: Secondary | ICD-10-CM

## 2016-09-02 ENCOUNTER — Ambulatory Visit (INDEPENDENT_AMBULATORY_CARE_PROVIDER_SITE_OTHER): Payer: BLUE CROSS/BLUE SHIELD | Admitting: Family Medicine

## 2016-09-02 ENCOUNTER — Encounter: Payer: Self-pay | Admitting: Family Medicine

## 2016-09-02 VITALS — BP 138/88 | HR 90 | Temp 98.8°F | Wt 225.0 lb

## 2016-09-02 DIAGNOSIS — R7303 Prediabetes: Secondary | ICD-10-CM | POA: Diagnosis not present

## 2016-09-02 DIAGNOSIS — Z1239 Encounter for other screening for malignant neoplasm of breast: Secondary | ICD-10-CM

## 2016-09-02 DIAGNOSIS — Z114 Encounter for screening for human immunodeficiency virus [HIV]: Secondary | ICD-10-CM | POA: Diagnosis not present

## 2016-09-02 DIAGNOSIS — Z0000 Encounter for general adult medical examination without abnormal findings: Secondary | ICD-10-CM

## 2016-09-02 DIAGNOSIS — Z1159 Encounter for screening for other viral diseases: Secondary | ICD-10-CM | POA: Diagnosis not present

## 2016-09-02 DIAGNOSIS — R739 Hyperglycemia, unspecified: Secondary | ICD-10-CM

## 2016-09-02 DIAGNOSIS — I10 Essential (primary) hypertension: Secondary | ICD-10-CM | POA: Diagnosis not present

## 2016-09-02 DIAGNOSIS — Z1231 Encounter for screening mammogram for malignant neoplasm of breast: Secondary | ICD-10-CM

## 2016-09-02 LAB — POCT GLYCOSYLATED HEMOGLOBIN (HGB A1C): HEMOGLOBIN A1C: 6.4

## 2016-09-02 MED ORDER — HYDROCHLOROTHIAZIDE 12.5 MG PO CAPS: 25.0000 mg | ORAL_CAPSULE | Freq: Every morning | ORAL | 11 refills | Status: DC

## 2016-09-02 NOTE — Assessment & Plan Note (Signed)
Restart metformin today. Check A1c.

## 2016-09-02 NOTE — Addendum Note (Signed)
Addended by: Dorna Bloom on: 09/02/2016 05:00 PM   Modules accepted: Orders

## 2016-09-02 NOTE — Patient Instructions (Signed)
We will not make any changes to your medications today.  We will check blood work.  Let me know if the metformin causes significant side effects.   Come back in 2 days to have your TB test read.  Take care,  Dr [name]

## 2016-09-02 NOTE — Progress Notes (Signed)
   Subjective:  Lisa Sullivan is a 51 y.o. female who presents to the [facility] today with a chief complaint of HTN.   HPI:  Hypertension BP Readings from Last 3 Encounters:  09/02/16 138/88  08/13/16 (!) 168/95  08/08/16 (!) 149/95   Home BP monitoring-No Compliant with medications-yes, without side effects ROS-Denies any CP, HA, SOB, blurry vision, LE edema, transient weakness, orthopnea, PND.   Right Knee Pain Chronic problem for patient. Seeing sports medicine. Had an MRI yesterday and is not sure of the results. She will be following up with them.  Seeing sports medicine. Chronic knee pain. Will going back to see them   Prediabetes Not consistently taking metformin. Says that it caused diarrhea and she stopped. Also caused her to have a period. No polyuria or polydipsia.   ROS: Per HPI  PMH: Smoking history reviewed.   Objective:  Physical Exam: BP 138/88   Pulse 90   Temp 98.8 F (37.1 C) (Oral)   Wt 225 lb (102.1 kg)   SpO2 99%   BMI 38.62 kg/m   Gen: NAD, resting comfortably CV: RRR with no murmurs appreciated Pulm: NWOB, CTAB with no crackles, wheezes, or rhonchi GI: Normal bowel sounds present. Soft, Nontender, Nondistended. Skin: warm, dry Neuro: grossly normal, moves all extremities Psych: Normal affect and thought content  Assessment/Plan:  Hypertension At goal. Continue HCTZ. Check CMET today.   Prediabetes Restart metformin today. Check A1c.   Healthcare maintenance Mammogram ordered. HIV and HCV also ordered.   Right Knee Pain Will follow up with sports medicine.   Algis Greenhouse. [name], [facility] Resident PGY-3 09/02/2016 9:32 AM

## 2016-09-02 NOTE — Assessment & Plan Note (Signed)
At goal. Continue HCTZ. Check CMET today.

## 2016-09-02 NOTE — Assessment & Plan Note (Signed)
Mammogram ordered. HIV and HCV also ordered.

## 2016-09-03 LAB — CMP14+EGFR
ALT: 12 IU/L (ref 0–32)
AST: 14 IU/L (ref 0–40)
Albumin/Globulin Ratio: 1.4 (ref 1.2–2.2)
Albumin: 4.2 g/dL (ref 3.5–5.5)
Alkaline Phosphatase: 92 IU/L (ref 39–117)
BUN / CREAT RATIO: 16 (ref 9–23)
BUN: 12 mg/dL (ref 6–24)
Bilirubin Total: 0.3 mg/dL (ref 0.0–1.2)
CALCIUM: 9.5 mg/dL (ref 8.7–10.2)
CO2: 23 mmol/L (ref 20–29)
CREATININE: 0.77 mg/dL (ref 0.57–1.00)
Chloride: 105 mmol/L (ref 96–106)
GFR calc Af Amer: 103 mL/min/{1.73_m2} (ref >59)
GFR calc non Af Amer: 89 mL/min/{1.73_m2} (ref >59)
GLOBULIN, TOTAL: 2.9 g/dL (ref 1.5–4.5)
Glucose: 106 mg/dL — ABNORMAL HIGH (ref 65–99)
POTASSIUM: 4.4 mmol/L (ref 3.5–5.2)
SODIUM: 142 mmol/L (ref 134–144)
Total Protein: 7.1 g/dL (ref 6.0–8.5)

## 2016-09-03 LAB — LIPID PANEL
Chol/HDL Ratio: 3.6 ratio (ref 0.0–4.4)
Cholesterol, Total: 185 mg/dL (ref 100–199)
HDL: 52 mg/dL (ref >39)
LDL Calculated: 105 mg/dL — ABNORMAL HIGH (ref 0–99)
Triglycerides: 141 mg/dL (ref 0–149)
VLDL Cholesterol Cal: 28 mg/dL (ref 5–40)

## 2016-09-03 LAB — CBC
Hematocrit: 40 % (ref 34.0–46.6)
Hemoglobin: 12.5 g/dL (ref 11.1–15.9)
MCH: 26.7 pg (ref 26.6–33.0)
MCHC: 31.3 g/dL — AB (ref 31.5–35.7)
MCV: 86 fL (ref 79–97)
PLATELETS: 265 10*3/uL (ref 150–379)
RBC: 4.68 x10E6/uL (ref 3.77–5.28)
RDW: 15 % (ref 12.3–15.4)
WBC: 5.5 10*3/uL (ref 3.4–10.8)

## 2016-09-03 LAB — HEPATITIS C ANTIBODY

## 2016-09-03 LAB — HIV ANTIBODY (ROUTINE TESTING W REFLEX): HIV SCREEN 4TH GENERATION: NONREACTIVE

## 2016-09-04 ENCOUNTER — Ambulatory Visit (INDEPENDENT_AMBULATORY_CARE_PROVIDER_SITE_OTHER): Payer: BLUE CROSS/BLUE SHIELD | Admitting: *Deleted

## 2016-09-04 DIAGNOSIS — Z111 Encounter for screening for respiratory tuberculosis: Secondary | ICD-10-CM

## 2016-09-04 LAB — TB SKIN TEST
INDURATION: 0 mm
TB SKIN TEST: NEGATIVE

## 2016-09-04 NOTE — Progress Notes (Signed)
   PPD Reading Note PPD read and results entered in EpicCare. Result: 0 mm induration. Interpretation: Negative If test not read within 48-72 hours of initial placement, patient advised to repeat in other arm 1-3 weeks after this test. Allergic reaction: no  Rector Devonshire L, RN  

## 2016-09-09 ENCOUNTER — Encounter: Payer: Self-pay | Admitting: Family Medicine

## 2016-09-09 NOTE — Progress Notes (Signed)
Results reviewed. Mild elevation in LDL. 10 year ASCVD of 5.2%. Consider starting medium potency statin at next visit. Letter mailed to patient.  Algis Greenhouse. [name], [place] Resident PGY-3 09/09/2016 4:10 PM

## 2016-09-18 ENCOUNTER — Encounter: Payer: Self-pay | Admitting: Family Medicine

## 2016-09-18 ENCOUNTER — Ambulatory Visit (INDEPENDENT_AMBULATORY_CARE_PROVIDER_SITE_OTHER): Payer: BLUE CROSS/BLUE SHIELD | Admitting: Family Medicine

## 2016-09-18 VITALS — BP 137/89 | HR 80 | Ht 64.0 in | Wt 224.0 lb

## 2016-09-18 DIAGNOSIS — M25561 Pain in right knee: Secondary | ICD-10-CM

## 2016-09-18 MED ORDER — DICLOFENAC SODIUM 75 MG PO TBEC: 75.0000 mg | DELAYED_RELEASE_TABLET | Freq: Two times a day (BID) | ORAL | 1 refills | Status: DC

## 2016-09-18 MED ORDER — METHYLPREDNISOLONE ACETATE 40 MG/ML IJ SUSP
40.0000 mg | Freq: Once | INTRAMUSCULAR | Status: AC
  Administered 2016-09-18: 40 mg via INTRA_ARTICULAR

## 2016-09-18 NOTE — Patient Instructions (Addendum)
We repeated your cortisone shot today. We will contact you when we have to gel shots to start those. Try diclofenac 75mg  twice a day with food for pain and inflammation.

## 2016-09-19 NOTE — Progress Notes (Signed)
PCP: Vivi Barrack, MD  Subjective:   HPI: Patient is a 51 y.o. female here for right knee pain.  5/22: Patient denies known injury or trauma. She reports for about 5 days she's had severe anterior right knee pain. No prior issues with this knee. Pain is 9/10, sharp. Difficulty bearing weight. No recent illness. No history of gout, pseudogout, rheumatoid arthritis. + swelling. Worse by end of day at work. No skin changes, numbness, fever.  6/27: Patient returns with continued pain in right knee. Pain is less at 5/10 but still sharp. Worse with walking, standing, sitting, at end of day at work. On feet a lot at work. Difficulty sleeping. Brace is causing more pain so not using. Taking ibuprofen, tramadol and occasionally icing. No skin changes, numbness.  Past Medical History:  Diagnosis Date  . Gall stone   . Hypertension     Current Outpatient Prescriptions on File Prior to Visit  Medication Sig Dispense Refill  . citalopram (CELEXA) 20 MG tablet TAKE 1 TABLET BY MOUTH DAILY 30 tablet 0  . clobetasol cream (TEMOVATE) 2.95 % Apply 1 application topically 2 (two) times daily. On hands.  0  . hydrochlorothiazide (MICROZIDE) 12.5 MG capsule Take 2 capsules (25 mg total) by mouth every morning. 60 capsule 11  . HYDROcodone-acetaminophen (NORCO) 5-325 MG tablet Take 1 tablet by mouth every 6 (six) hours as needed for moderate pain. 20 tablet 0  . metFORMIN (GLUCOPHAGE XR) 750 MG 24 hr tablet Take 1 tablet (750 mg total) by mouth daily with breakfast. 30 tablet 11  . pantoprazole (PROTONIX) 40 MG tablet TAKE 1 TABLET BY MOUTH DAILY 30 tablet 5  . tacrolimus (PROTOPIC) 0.1 % ointment   10  . Zoster Vaccine Live, PF, (ZOSTAVAX) 18841 UNT/0.65ML injection Inject 19,400 Units into the skin once. 1 each 0   No current facility-administered medications on file prior to visit.     Past Surgical History:  Procedure Laterality Date  . CESAREAN SECTION    . TUBAL LIGATION       Allergies  Allergen Reactions  . Bee Venom Anaphylaxis  . Latex Other (See Comments)    Irritates skin, thinks all gloves irritates skins  . Sulfa Antibiotics Hives    Social History   Social History  . Marital status: Single    Spouse name: N/A  . Number of children: N/A  . Years of education: N/A   Occupational History  . Not on file.   Social History Main Topics  . Smoking status: Current Some Day Smoker    Packs/day: 0.50  . Smokeless tobacco: Never Used  . Alcohol use Yes     Comment: socially   . Drug use: No  . Sexual activity: Not on file   Other Topics Concern  . Not on file   Social History Narrative  . No narrative on file    Family History  Problem Relation Age of Onset  . Diabetes Mother   . Stroke Father   . Colon cancer Neg Hx   . Esophageal cancer Neg Hx   . Stomach cancer Neg Hx     BP 137/89   Pulse 80   Ht 5\' 4"  (1.626 m)   Wt 224 lb (101.6 kg)   BMI 38.45 kg/m   Review of Systems: See HPI above.     Objective:  Physical Exam:  Gen: NAD, comfortable in exam room  Right knee: Mod effusion.  No other gross deformity, ecchymoses.  No  erythema. TTP medial > lateral joint lines, suprapatellar area. ROM 0-110 degrees. Negative ant/post drawers. Negative valgus/varus testing. Negative lachmanns. Pain with mcmurrays, apleys.  Negative patellar apprehension. NV intact distally.  Left knee: FROM without pain.   Assessment & Plan:  1. Right knee pain - MRI with degenerative changes and subchondral edema, effusion.  Small loose body seen also.  Difficulty getting viscosupplementation approved - her insurance requires a specific one - this is how she would like to proceed but wants something today also - repeated cortisone injection.  Joint fluid was noninflammatory, no evidence crystals.  Try diclofenac twice a day with food also.  After informed written consent, patient was lying supine on exam table. Right knee was prepped with  alcohol swab and utilizing superolateral approach with ultrasound guidance, patient's right knee was injected intraarticularly with 3:1 bupivicaine: depomedrol. Patient tolerated the procedure well without immediate complications.

## 2016-09-19 NOTE — Assessment & Plan Note (Signed)
MRI with degenerative changes and subchondral edema, effusion.  Small loose body seen also.  Difficulty getting viscosupplementation approved - her insurance requires a specific one - this is how she would like to proceed but wants something today also - repeated cortisone injection.  Joint fluid was noninflammatory, no evidence crystals.  Try diclofenac twice a day with food also.  After informed written consent, patient was lying supine on exam table. Right knee was prepped with alcohol swab and utilizing superolateral approach with ultrasound guidance, patient's right knee was injected intraarticularly with 3:1 bupivicaine: depomedrol. Patient tolerated the procedure well without immediate complications.

## 2016-10-14 ENCOUNTER — Telehealth: Payer: Self-pay | Admitting: Family Medicine

## 2016-10-14 NOTE — Telephone Encounter (Signed)
Patient states she is experiencing pain in her R knee worse than before and asked about availability of gel shots. Patient was informed that we are still in the process of getting gel shots  Patient asked about cortisone injections in the mean time. States she has had 2 cortisone injections in less than 6 months

## 2016-10-14 NOTE — Telephone Encounter (Signed)
Insurance denied Supartz. Resubmitted form and Synvisc does not require prior authorization.

## 2016-10-14 NOTE — Telephone Encounter (Signed)
She had an injection 2 months ago and again 1 month ago.  They've obviously not provided her with relief.  I would not repeat a third cortisone injection.  I would refer back to prior instructions about arthritis.  Her insurance denied viscosupplementation I believe regardless of the medication, right Nevin Bloodgood?  We are switching from supartz to synvisc but don't think the change would allow her to get approval, right?

## 2016-10-14 NOTE — Telephone Encounter (Signed)
Ok thanks, please let her know if you haven't already.

## 2016-11-08 ENCOUNTER — Encounter: Payer: Self-pay | Admitting: Family Medicine

## 2016-11-08 ENCOUNTER — Ambulatory Visit (INDEPENDENT_AMBULATORY_CARE_PROVIDER_SITE_OTHER): Payer: BLUE CROSS/BLUE SHIELD | Admitting: Family Medicine

## 2016-11-08 VITALS — BP 118/76 | HR 78 | Temp 98.1°F | Ht 64.0 in | Wt 230.0 lb

## 2016-11-08 DIAGNOSIS — M25561 Pain in right knee: Secondary | ICD-10-CM | POA: Diagnosis not present

## 2016-11-08 NOTE — Progress Notes (Signed)
    Subjective:  Lisa Sullivan is a 51 y.o. female who presents to the [facility] today with a chief complaint of knee pain  HPI:  Right knee pain: Patient reportedly fell several months ago on her right knee and has since been followed by sports medicine in [facility]. Recently had MRI that showed subchondral edema with effusion and possible free body. Had steroid injection and persistent pain. Denies any locking popping sensation, swelling, edema, warmth or drainage from the knee.   Smoking status reviewed Medication: reviewed and updated ROS: see HPI   Objective:  Physical Exam: BP 118/76   Pulse 78   Temp 98.1 F (36.7 C) (Oral)   Ht 5\' 4"  (1.626 m)   Wt 230 lb (104.3 kg)   SpO2 97%   BMI 39.48 kg/m   Gen: 51 year old female in NAD, resting comfortably CV: RRR with no murmurs appreciated Pulm: NWOB, CTAB with no crackles, wheezes, or rhonchi GI: Normal bowel sounds present. Soft, Nontender, Nondistended. MSK:  antalgic gait, soft tissue tenderness over right medial lateral joint lines, reduced range of motion with knee flexion, exam limited by acuity of pain, negative drawer sign, collateral ligaments intact. Skin: warm, dry Neuro: grossly normal, moves all extremities Psych: Normal affect and thought content  No results found for this or any previous visit (from the past 72 hour(s)).   Assessment/Plan:  Right knee pain Persistent pain with tenderness at the medial lateral joint lines, antalgic gait limited range of motion with knee flexion and MRI concerning for right lateral meniscal tear. No improvement with corticosteroid injection at the end of June. Reasonable to consult orthopedic surgery at this point for possible knee arthroscopically.  Ultimately needs significant weight loss and could possibly require knee replacement in the future. - placed referral to orthopedic surgery - Continue with NSAIDs prescribed by Dr. Barbaraann Barthel

## 2016-11-08 NOTE — Assessment & Plan Note (Addendum)
Persistent pain with tenderness at the medial lateral joint lines, antalgic gait limited range of motion with knee flexion and MRI concerning for right lateral meniscal tear. No improvement with corticosteroid injection at the end of June. Reasonable to consult orthopedic surgery at this point for possible knee arthroscopically.  Ultimately needs significant weight loss and could possibly require knee replacement in the future. - placed referral to orthopedic surgery - Continue with NSAIDs prescribed by Dr. Barbaraann Barthel

## 2016-11-08 NOTE — Patient Instructions (Addendum)
Lisa Sullivan, you were seen today for right knee pain.  After reviewing the MRI and looks like you have a tear on your right meniscus. This could however be an artifact due to a possible free body in your knee.   Since you are still having your knee pain I am recommending that you see a orthopedic surgeon for possible arthroscopy. I have put in a referral and they will be contacting you for an appointment.  Very nice to see you today, Danyelle Brookover L. Rosalyn Gess, [place] [facility] Resident PGY-2 11/08/2016 4:08 PM

## 2016-11-20 ENCOUNTER — Ambulatory Visit (INDEPENDENT_AMBULATORY_CARE_PROVIDER_SITE_OTHER): Payer: Self-pay

## 2016-11-20 ENCOUNTER — Encounter (INDEPENDENT_AMBULATORY_CARE_PROVIDER_SITE_OTHER): Payer: Self-pay | Admitting: Orthopedic Surgery

## 2016-11-20 ENCOUNTER — Ambulatory Visit (INDEPENDENT_AMBULATORY_CARE_PROVIDER_SITE_OTHER): Payer: BLUE CROSS/BLUE SHIELD | Admitting: Orthopedic Surgery

## 2016-11-20 DIAGNOSIS — M25561 Pain in right knee: Secondary | ICD-10-CM | POA: Diagnosis not present

## 2016-11-20 DIAGNOSIS — M1711 Unilateral primary osteoarthritis, right knee: Secondary | ICD-10-CM | POA: Diagnosis not present

## 2016-11-20 MED ORDER — NABUMETONE 750 MG PO TABS: 750.0000 mg | ORAL_TABLET | Freq: Two times a day (BID) | ORAL | 3 refills | Status: AC | PRN

## 2016-11-20 NOTE — Progress Notes (Signed)
Office Visit Note   Patient: Lisa Sullivan           Date of Birth: 1965-06-15           MRN: [id number] Visit Date: 11/20/2016              Requested by: Eloise Levels, MD [address] [place], [place] [address] PCP: Eloise Levels, MD  Chief Complaint  Patient presents with  . Right Knee - Pain      HPI: Patient is a 51 year old woman has been having several month history of right knee pain. She had an MRI scan in June she has tried anti-inflammatories she has tried Vicodin she has had 2 aspirations and injections of her knee with steroid which has not helped. Patient states the pain is been present for about 6 months which she describes is global pain around the knee worse with ambulation.  Assessment & Plan: Visit Diagnoses:  1. Right knee pain, unspecified chronicity   2. Unilateral primary osteoarthritis, right knee     Plan: We will call a prescription for Relafen discussed the possibility of repeat injection discussed the possibility of hyaluronic acid injection discussed the possibility of a arthroscopic intervention. Discussed that the arthroscopy  may not be very beneficial due to articular pain coming from arthritis and probably not from meniscal pathology.  Follow-Up Instructions: Return in about 4 weeks (around 12/18/2016).   Ortho Exam  Patient is alert, oriented, no adenopathy, well-dressed, normal affect, normal respiratory effort. Examination patient has an antalgic gait. She does have an effusion of the right knee causing cruciates are stable there is crepitation in the patellofemoral joint with range of motion. She is tender to palpation over the medial lateral joint line.  Review of her MRI scan shows mild medial and lateral meniscal pathology with osteoarthritic changes with edema in the bone of the medial joint line.  Imaging: Xr Knee 1-2 Views Right  Result Date: 11/20/2016 Two-view radiographs of the right knee shows joint space narrowing of  the medial joint line with no subcondylar cysts no periarticular bony spurs  No images are attached to the encounter.  Labs: Lab Results  Component Value Date   HGBA1C 6.4 09/02/2016   HGBA1C 6.3 06/18/2016   HGBA1C 6.3 12/15/2014   REPTSTATUS 02/26/2007 FINAL 02/24/2007   GRAMSTAIN  02/24/2007    NO WBC SEEN RARE SQUAMOUS EPITHELIAL CELLS PRESENT FEW GRAM POSITIVE COCCI IN CLUSTERS IN PAIRS   CULT  02/24/2007    MODERATE METHICILLIN RESISTANT STAPHYLOCOCCUS AUREUS Note: RIFAMPIN AND GENTAMICIN SHOULD NOT BE USED AS SINGLE DRUGS FOR TREATMENT OF STAPH INFECTIONS. Contact laboratory if Clindamycin results needed. [phone] CRITICAL RESULT CALLED TO, READ BACK BY AND VERIFIED WITH: TONY SILVIANO 02/26/07 1020 BY  SMITHERSJ   LABORGA METHICILLIN RESISTANT STAPHYLOCOCCUS AUREUS 02/24/2007    Orders:  Orders Placed This Encounter  Procedures  . XR Knee 1-2 Views Right   Meds ordered this encounter  Medications  . nabumetone (RELAFEN) 750 MG tablet    Sig: Take 1 tablet (750 mg total) by mouth 2 (two) times daily as needed for mild pain or moderate pain. with food    Dispense:  60 tablet    Refill:  3     Procedures: No procedures performed  Clinical Data: No additional findings.  ROS:  All other systems negative, except as noted in the HPI. Review of Systems  Objective: Vital Signs: There were no vitals taken for this visit.  Specialty Comments:  No specialty comments available.  PMFS History: Patient Active Problem List   Diagnosis Date Noted  . Right knee pain 08/16/2016  . Prediabetes 06/18/2016  . Left knee pain 04/26/2015  . Depression 01/23/2015  . Headache 12/15/2014  . Healthcare maintenance 12/15/2014  . Hypertension 02/28/2011  . Goiter 02/28/2011  . OBESITY, UNSPECIFIED 03/27/2010  . SMOKER 03/27/2010  . 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 Gall stone   . Hypertension     Family History    Problem Relation Age of Onset  . Diabetes Mother   . Stroke Father   . Colon cancer Neg Hx   . Esophageal cancer Neg Hx   . Stomach cancer Neg Hx     Past Surgical History:  Procedure Laterality Date  . CESAREAN SECTION    . TUBAL LIGATION     Social History   Occupational History  . Not on file.   Social History Main Topics  . Smoking status: Current Some Day Smoker    Packs/day: 0.50  . Smokeless tobacco: Never Used  . Alcohol use Yes     Comment: socially   . Drug use: No  . Sexual activity: Not on file

## 2016-12-05 ENCOUNTER — Other Ambulatory Visit: Payer: Self-pay | Admitting: Family Medicine

## 2016-12-05 DIAGNOSIS — Z1239 Encounter for other screening for malignant neoplasm of breast: Secondary | ICD-10-CM

## 2016-12-18 ENCOUNTER — Ambulatory Visit (INDEPENDENT_AMBULATORY_CARE_PROVIDER_SITE_OTHER): Payer: BLUE CROSS/BLUE SHIELD | Admitting: Orthopedic Surgery

## 2016-12-19 ENCOUNTER — Ambulatory Visit: Payer: BLUE CROSS/BLUE SHIELD | Admitting: Family Medicine

## 2016-12-19 ENCOUNTER — Ambulatory Visit: Payer: BLUE CROSS/BLUE SHIELD

## 2016-12-23 ENCOUNTER — Ambulatory Visit: Payer: BLUE CROSS/BLUE SHIELD | Admitting: Family Medicine

## 2016-12-25 ENCOUNTER — Ambulatory Visit (INDEPENDENT_AMBULATORY_CARE_PROVIDER_SITE_OTHER): Payer: BLUE CROSS/BLUE SHIELD | Admitting: Orthopedic Surgery

## 2016-12-27 ENCOUNTER — Ambulatory Visit
Admission: RE | Admit: 2016-12-27 | Discharge: 2016-12-27 | Disposition: A | Discharge status: Home / Self Care | Payer: BLUE CROSS/BLUE SHIELD | Source: Ambulatory Visit | Attending: Family Medicine | Admitting: Family Medicine

## 2016-12-27 DIAGNOSIS — Z1239 Encounter for other screening for malignant neoplasm of breast: Secondary | ICD-10-CM

## 2017-01-13 ENCOUNTER — Ambulatory Visit (INDEPENDENT_AMBULATORY_CARE_PROVIDER_SITE_OTHER): Payer: BLUE CROSS/BLUE SHIELD | Admitting: Orthopedic Surgery

## 2017-01-14 NOTE — Telephone Encounter (Signed)
MRI done on 09/01/2016

## 2017-02-17 ENCOUNTER — Encounter (INDEPENDENT_AMBULATORY_CARE_PROVIDER_SITE_OTHER): Payer: Self-pay | Admitting: Family

## 2017-02-17 ENCOUNTER — Ambulatory Visit (INDEPENDENT_AMBULATORY_CARE_PROVIDER_SITE_OTHER): Payer: BLUE CROSS/BLUE SHIELD | Admitting: Family

## 2017-02-17 VITALS — Ht 64.0 in | Wt 230.0 lb

## 2017-02-17 DIAGNOSIS — M1711 Unilateral primary osteoarthritis, right knee: Secondary | ICD-10-CM | POA: Diagnosis not present

## 2017-02-17 MED ORDER — LIDOCAINE HCL 1 % IJ SOLN
5.0000 mL | INTRAMUSCULAR | Status: AC | PRN
  Administered 2017-02-17: 5 mL

## 2017-02-17 MED ORDER — METHYLPREDNISOLONE ACETATE 40 MG/ML IJ SUSP
40.0000 mg | INTRAMUSCULAR | Status: AC | PRN
  Administered 2017-02-17: 40 mg via INTRA_ARTICULAR

## 2017-02-17 NOTE — Progress Notes (Addendum)
Office Visit Note   Patient: Lisa Sullivan           Date of Birth: 12/24/1965           MRN: [id number] Visit Date: 02/17/2017              Requested by: Eloise Levels, MD [address] [place], [place] [address] PCP: Eloise Levels, MD  Chief Complaint  Patient presents with  . Right Knee - Pain      HPI: Patient is a 51 year old woman has been having right knee pain since last January. She had an MRI scan in June she has tried anti-inflammatories she has tried Vicodin. she has had 2 aspirations and injections of her knee with steroid which provided very temporary relief. Patient states the pain is been present for about 6 months which she describes is global pain around the knee worse with ambulation. Complains of crepitation. No mechanical symptoms.  Assessment & Plan: Visit Diagnoses:  1. Primary osteoarthritis of right knee     Plan: Depomedrol injection today. We discussed the possibility of hyaluronic acid injection discussed the possibility of a arthroscopic intervention. Will order Synvisc. Discussed that the arthroscopy may not be very beneficial due to articular pain coming from arthritis and probably not from meniscal pathology.   Follow-Up Instructions: Return if symptoms worsen or fail to improve.   Ortho Exam  Patient is alert, oriented, no adenopathy, well-dressed, normal affect, normal respiratory effort. Examination patient has an antalgic gait. She does have an effusion of the right knee. Collaterals and cruciates are stable. there is crepitation in the patellofemoral joint with range of motion. She is tender to palpation over the medial and lateral joint line.  Review of her MRI scan shows mild medial and lateral meniscal pathology with osteoarthritic changes with edema in the bone of the medial joint line.  Imaging: No results found. No images are attached to the encounter.  Labs: Lab Results  Component Value Date   HGBA1C 6.4 09/02/2016   HGBA1C 6.3 06/18/2016   HGBA1C 6.3 12/15/2014   REPTSTATUS 02/26/2007 FINAL 02/24/2007   GRAMSTAIN  02/24/2007    NO WBC SEEN RARE SQUAMOUS EPITHELIAL CELLS PRESENT FEW GRAM POSITIVE COCCI IN CLUSTERS IN PAIRS   CULT  02/24/2007    MODERATE METHICILLIN RESISTANT STAPHYLOCOCCUS AUREUS Note: RIFAMPIN AND GENTAMICIN SHOULD NOT BE USED AS SINGLE DRUGS FOR TREATMENT OF STAPH INFECTIONS. Contact laboratory if Clindamycin results needed. [phone] CRITICAL RESULT CALLED TO, READ BACK BY AND VERIFIED WITH: TONY SILVIANO 02/26/07 1020 BY  SMITHERSJ   LABORGA METHICILLIN RESISTANT STAPHYLOCOCCUS AUREUS 02/24/2007    Orders:  No orders of the defined types were placed in this encounter.  No orders of the defined types were placed in this encounter.    Procedures: Large Joint Inj: R knee on 02/17/2017 1:41 PM Indications: pain Details: 18 G 1.5 in needle, anteromedial approach Medications: 5 mL lidocaine 1 %; 40 mg methylPREDNISolone acetate 40 MG/ML Consent was given by the patient.      Clinical Data: No additional findings.  ROS:  All other systems negative, except as noted in the HPI. Review of Systems  Objective: Vital Signs: Ht 5\' 4"  (1.626 m)   Wt 230 lb (104.3 kg)   BMI 39.48 kg/m   Specialty Comments:  No specialty comments available.  PMFS History: Patient Active Problem List   Diagnosis Date Noted  . Right knee pain 08/16/2016  . Prediabetes 06/18/2016  . Left knee  pain 04/26/2015  . Depression 01/23/2015  . Headache 12/15/2014  . Healthcare maintenance 12/15/2014  . Hypertension 02/28/2011  . Goiter 02/28/2011  . OBESITY, UNSPECIFIED 03/27/2010  . SMOKER 03/27/2010  . 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 Gall stone   . Hypertension     Family History  Problem Relation Age of Onset  . Diabetes Mother   . Stroke Father   . Colon cancer Neg Hx   . Esophageal cancer Neg Hx   . Stomach cancer Neg Hx       Past Surgical History:  Procedure Laterality Date  . CESAREAN SECTION    . TUBAL LIGATION     Social History   Occupational History  . Not on file  Tobacco Use  . Smoking status: Current Some Day Smoker    Packs/day: 0.50  . Smokeless tobacco: Never Used  Substance and Sexual Activity  . Alcohol use: Yes    Comment: socially   . Drug use: No  . Sexual activity: Not on file

## 2017-03-20 ENCOUNTER — Other Ambulatory Visit: Payer: Self-pay | Admitting: Family Medicine

## 2017-04-25 ENCOUNTER — Ambulatory Visit: Payer: BLUE CROSS/BLUE SHIELD | Admitting: Family Medicine

## 2017-04-25 ENCOUNTER — Encounter: Payer: Self-pay | Admitting: Family Medicine

## 2017-04-25 ENCOUNTER — Other Ambulatory Visit: Payer: Self-pay

## 2017-04-25 VITALS — BP 138/90 | HR 80 | Temp 97.9°F | Ht 64.0 in | Wt 238.4 lb

## 2017-04-25 DIAGNOSIS — M25531 Pain in right wrist: Secondary | ICD-10-CM

## 2017-04-25 DIAGNOSIS — Z23 Encounter for immunization: Secondary | ICD-10-CM

## 2017-04-25 MED ORDER — METHYLPREDNISOLONE ACETATE 80 MG/ML IJ SUSP: 80.0000 mg | Freq: Once | INTRAMUSCULAR | Status: DC

## 2017-04-25 MED ORDER — METHYLPREDNISOLONE ACETATE 80 MG/ML IJ SUSP
20.0000 mg | Freq: Once | INTRAMUSCULAR | Status: AC
  Administered 2017-04-25: 20 mg via INTRA_ARTICULAR

## 2017-04-25 NOTE — Patient Instructions (Addendum)
Lisa Sullivan, you were seen today for right thumb and wrist pain.  You have some physical exam findings consistent with a tendon condition called DeQuervain tenosynovitis and also carpal tunnel syndrome.  We did an injection of your wrist today to help with the pain.  I am also putting in a referral for a study called an EMG to confirm whether or not you have carpal tunnel syndrome.   I am also recommending that you ice your wrist as needed as well as take ibuprofen as needed.  You should also pick up a spica thumb splint and wear this 24 hours a day for the next 4-6 weeks.   If you develop any redness, swelling, drainage, fevers or chills please come back to the clinic to be evaluated prior to the 4-6-week mark.   It was very nice to see you today, Yaritzy Huser L. Rosalyn Gess, [place] Resident PGY-2 04/25/2017 11:22 AM

## 2017-04-28 ENCOUNTER — Other Ambulatory Visit: Payer: Self-pay | Admitting: *Deleted

## 2017-04-28 ENCOUNTER — Encounter: Payer: Self-pay | Admitting: Neurology

## 2017-04-28 DIAGNOSIS — M25531 Pain in right wrist: Secondary | ICD-10-CM

## 2017-04-29 ENCOUNTER — Encounter: Payer: Self-pay | Admitting: Family Medicine

## 2017-04-29 DIAGNOSIS — M25531 Pain in right wrist: Secondary | ICD-10-CM | POA: Insufficient documentation

## 2017-04-29 NOTE — Assessment & Plan Note (Signed)
Patient has positive finkelstein, tinnels and phalens signs as well as TTP in first dorsal compartment of the wrist on the right side. Picture is consistent with both dequervain tenosynovitis and carpal tunnel.  She has had no improvement with conservative management and has agreed to wrist injection today.  This was performed without any complications.  Have recommended the patient pick up a thumb spica splint and to wear this 24 hours daily for the next 4-6 weeks.  Additionally have placed referral for EMG with neurology.  Discussed return precautions regarding injection.

## 2017-04-29 NOTE — Progress Notes (Signed)
    Subjective:  Lisa Sullivan is a 52 y.o. female who presents to the [facility] today with a chief complaint of right hand and wrist pain  HPI:  Patient is a 52 year old female presenting with 2-3 weeks of tingling in her right thumb, second and third digits as well as pain in her wrist near her thumb.  She denies any trauma.  Denies any fevers, chills, nausea, vomiting or diarrhea.  She has pain with flexion of the wrist as well as ulnar deviation.   PMH: Arthritis of the right knee Tobacco use: Current smoker Medication: reviewed and updated ROS: see HPI   Objective:  Physical Exam: BP 138/90   Pulse 80   Temp 97.9 F (36.6 C) (Oral)   Ht 5\' 4"  (1.626 m)   Wt 238 lb 6.4 oz (108.1 kg)   SpO2 96%   BMI 40.92 kg/m   Gen: 52 year old female in NAD, resting comfortably CV: RRR with no murmurs appreciated Pulm: NWOB, CTAB with no crackles, wheezes, or rhonchi GI: Normal bowel sounds present. Soft, Nontender, Nondistended. MSK: no edema, cyanosis, or clubbing noted, Tenderness to palpation at the first dorsal compartment of the right wrist. Pain with active and passive wrist flexion and ulnar deviation. Positive finkelstein test, phalens and tinnels.  TTP over the radial styloid. Skin: warm, dry Neuro: grossly normal, moves all extremities Psych: Normal affect and thought content  No results found for this or any previous visit (from the past 72 hour(s)).  INJECTION: DeQuervain tenosynovitis injection, right hand Patient was given informed consent, signed copy in the chart. Appropriate time out was taken. Area prepped and draped in usual sterile fashion. Right thumb was maximally abducted and 1 cc of methylprednisolone 20 mg/ml plus  2 cc of 1% lidocaine without epinephrine was injected into the anatomical snuffbox at the base of the thumb between the abductor pollicus longus and brevis.  compartment dorsal compartment. The patient tolerated the procedure well. There were no  complications. Post procedure instructions were given.  Assessment/Plan:  Wrist pain, right Patient has positive finkelstein, tinnels and phalens signs as well as TTP in first dorsal compartment of the wrist on the right side. Picture is consistent with both dequervain tenosynovitis and carpal tunnel.  She has had no improvement with conservative management and has agreed to wrist injection today.  This was performed without any complications.  Have recommended the patient pick up a thumb spica splint and to wear this 24 hours daily for the next 4-6 weeks.  Additionally have placed referral for EMG with neurology.  Discussed return precautions regarding injection.  Healthcare maintenance Received flu shot today.  Ercia Crisafulli L. Rosalyn Gess, [place] Resident PGY-2 04/29/2017 1:22 PM

## 2017-05-15 ENCOUNTER — Ambulatory Visit (INDEPENDENT_AMBULATORY_CARE_PROVIDER_SITE_OTHER): Payer: BLUE CROSS/BLUE SHIELD | Admitting: Neurology

## 2017-05-15 DIAGNOSIS — M25531 Pain in right wrist: Secondary | ICD-10-CM

## 2017-05-15 DIAGNOSIS — G5601 Carpal tunnel syndrome, right upper limb: Secondary | ICD-10-CM

## 2017-05-15 NOTE — Procedures (Signed)
[facility]  [place], [place]  [place], [place] [address] Tel: [phone] Fax:  [phone] Test Date:  05/15/2017  Patient: Lisa Sullivan DOB: 1965-10-14 Physician: Narda Amber, DO  Sex: Female Height: 5\' 4"  Ref Phys: Rosana Berger, MD  ID#: [id number] Temp: 37.4C Technician:    Patient Complaints: This is a 52 year old female referred for evaluation of right wrist pain and hand paresthesias.  NCV & EMG Findings: Extensive electrodiagnostic testing of the right upper extremity shows:  1. Right median sensory response is absent. Right ulnar sensory response is within normal limits. 2. Right median motor response shows prolonged latency (5.3 ms) and normal amplitude. Right ulnar motor responses within normal limits. 3. Chronic motor axon loss changes isolated to the right abductor pollicis brevis muscle, without cognitive active denervation.  Impression: Right median neuropathy at or distal to the wrist, consistent with the clinical diagnosis of carpal tunnel syndrome; these findings are severe in degree electrically.   ___________________________ Narda Amber, DO    Nerve Conduction Studies Anti Sensory Summary Table   Site NR Peak (ms) Norm Peak (ms) P-T Amp (V) Norm P-T Amp  Right Median Anti Sensory (2nd Digit)  37.4C  Wrist NR  <3.6  >15  Right Ulnar Anti Sensory (5th Digit)  37.4C  Wrist    2.3 <3.1 28.2 >10   Motor Summary Table   Site NR Onset (ms) Norm Onset (ms) O-P Amp (mV) Norm O-P Amp Site1 Site2 Delta-0 (ms) Dist (cm) Vel (m/s) Norm Vel (m/s)  Right Median Motor (Abd Poll Brev)  37.4C  Wrist    5.3 <4.0 7.1 >6 Elbow Wrist 4.9 28.0 57 >50  Elbow    10.2  7.1         Right Ulnar Motor (Abd Dig Minimi)  37.4C  Wrist    1.9 <3.1 8.5 >7 B Elbow Wrist 4.0 25.0 63 >50  B Elbow    5.9  8.1  A Elbow B Elbow 1.8 10.0 56 >50  A Elbow    7.7  7.9          EMG   Side Muscle Ins Act Fibs Psw Fasc Number Recrt Dur Dur. Amp Amp. Poly Poly.  Comment  Right 1stDorInt Nml Nml Nml Nml Nml Nml Nml Nml Nml Nml Nml Nml N/A  Right Abd Poll Brev Nml Nml Nml Nml 1- Rapid Few 1+ Few 1+ Nml Nml N/A  Right PronatorTeres Nml Nml Nml Nml Nml Nml Nml Nml Nml Nml Nml Nml N/A  Right Biceps Nml Nml Nml Nml Nml Nml Nml Nml Nml Nml Nml Nml N/A  Right Triceps Nml Nml Nml Nml Nml Nml Nml Nml Nml Nml Nml Nml N/A  Right Deltoid Nml Nml Nml Nml Nml Nml Nml Nml Nml Nml Nml Nml N/A      Waveforms:

## 2017-07-13 NOTE — Progress Notes (Deleted)
   [facility] Phone: [phone]   Date of Visit: 07/14/2017   HPI:  ***  ROS: See HPI.  [place]:  PMH: HTN Goiter Obesity Smoker Depression Prediabetes  PHYSICAL EXAM: There were no vitals taken for this visit. Gen: *** HEENT: *** Heart: *** Lungs: *** Neuro: *** Ext: ***  ASSESSMENT/PLAN:  Health maintenance:  -***  No problem-specific Assessment & Plan notes found for this encounter.  FOLLOW UP: Follow up in *** for ***  Smiley Houseman, MD PGY [place]

## 2017-07-14 ENCOUNTER — Ambulatory Visit: Payer: BLUE CROSS/BLUE SHIELD | Admitting: Internal Medicine

## 2017-07-24 ENCOUNTER — Ambulatory Visit (INDEPENDENT_AMBULATORY_CARE_PROVIDER_SITE_OTHER): Payer: BLUE CROSS/BLUE SHIELD | Admitting: Internal Medicine

## 2017-07-24 ENCOUNTER — Other Ambulatory Visit: Payer: Self-pay

## 2017-07-24 ENCOUNTER — Ambulatory Visit (HOSPITAL_COMMUNITY)
Admission: RE | Admit: 2017-07-24 | Discharge: 2017-07-24 | Disposition: A | Discharge status: Home / Self Care | Payer: BLUE CROSS/BLUE SHIELD | Source: Ambulatory Visit | Attending: Family Medicine | Admitting: Family Medicine

## 2017-07-24 ENCOUNTER — Encounter: Payer: Self-pay | Admitting: Internal Medicine

## 2017-07-24 VITALS — BP 130/80 | HR 88 | Temp 99.1°F | Ht 64.0 in | Wt 233.0 lb

## 2017-07-24 DIAGNOSIS — R509 Fever, unspecified: Secondary | ICD-10-CM

## 2017-07-24 DIAGNOSIS — R059 Cough, unspecified: Secondary | ICD-10-CM

## 2017-07-24 DIAGNOSIS — J181 Lobar pneumonia, unspecified organism: Secondary | ICD-10-CM | POA: Diagnosis not present

## 2017-07-24 DIAGNOSIS — R05 Cough: Secondary | ICD-10-CM | POA: Diagnosis not present

## 2017-07-24 NOTE — Patient Instructions (Signed)
Please get your chest xray either at [facility] for [facility] I will call you with the results, which will determine our next steps.

## 2017-07-24 NOTE — Progress Notes (Signed)
   [facility] Phone: [phone]   Date of Visit: 07/24/2017   HPI:  Cough and Fever:  -  Reports of a mainly dry cough with intermittent phlegm production since Monday. She also reports of body aches body achesbody aches, fatigue, decreased appetite, and subjective fevers with chills. No rhinorrhea, nasal congestion, or sore throat. No shortness of breath. Reports that her ribs are hurting due to coughing.  - no vomiting but reports of nausea and decreased appetite.  - her grandchild had a mild cough, and she does work at a day care.  - no diarrhea  - current tobacco smoker  - symptoms started suddenly and seem to be worsening - has tried tussin, alleve, ibuprofen without much improvement   ROS: See HPI.  [place]:  PMH: Obesity  Tobacco Use Depression  Prediabetes  PHYSICAL EXAM: BP 130/80 (BP Location: Left Arm, Patient Position: Sitting, Cuff Size: Large)   Pulse 88   Temp 99.1 F (37.3 C) (Oral)   Ht 5\' 4"  (1.626 m)   Wt 233 lb (105.7 kg)   SpO2 96%   BMI 39.99 kg/m  GEN: tired appearing, no respiratory distress HEENT: Atraumatic, normocephalic, neck supple without lymphadenopathy, EOMI, sclera clear, oropharynx without significant erythema. Moist mucous membranes.   CV: RRR, no murmurs, rubs, or gallops PULM: CTAB, normal effort without crackles or wheezing  SKIN: No rash or cyanosis; warm and well-perfused EXTR: No lower extremity edema or calf tenderness PSYCH: Mood and affect euthymic, normal rate and volume of speech NEURO: Awake, alert, no focal deficits grossly, normal speech  ASSESSMENT/PLAN:  Cough with subjective fevers:  Differentials include bronchitis vs possible PNA. Vitals are stable. Afebrile in clinic. Tired appearing but no respiratory distress. Her lung exam is clear, however due to subjective fevers at home and tobacco use, will go ahead and obtain CXR to further evaluate.   Smiley Houseman, MD PGY [place]

## 2017-07-25 ENCOUNTER — Telehealth: Payer: Self-pay | Admitting: Internal Medicine

## 2017-07-25 MED ORDER — AZITHROMYCIN 250 MG PO TABS: ORAL_TABLET | ORAL | 0 refills | Status: DC

## 2017-07-25 NOTE — Telephone Encounter (Signed)
Got in touch with patient and informed of what was noted in the prior note.

## 2017-07-25 NOTE — Addendum Note (Signed)
Addended by: Smiley Houseman on: 07/25/2017 11:53 AM   Modules accepted: Orders

## 2017-07-25 NOTE — Telephone Encounter (Signed)
Attempted to call patient to report that her CXR shows a pneumonia. But went to voicemail. Left message to call back. I will go ahead and send in prescription for Azithromycin for her pneumonia. Blue team if you could please try to get in touch with her to inform her of this.

## 2017-07-28 ENCOUNTER — Telehealth: Payer: Self-pay

## 2017-07-28 NOTE — Telephone Encounter (Signed)
Patient called and is still not feeling better. Has been taking Z-pack since Friday, has one dose left. Still coughing, feels very weak. Does not think she has had fever but has been sweating.Wanted to know if she should wait longer or be seen again?  Call back is [phone].  Danley Danker, RN [facility] RN)

## 2017-07-28 NOTE — Telephone Encounter (Signed)
Patient called. She reports that her cough has not worsened and is the same from previous visit. She has one day left of her azithromycin. She is to come in to the office if she is not feeling better tomorrow or the next day. Patient instructed to go to the ED if she develops worsening cough and shortness of breath.

## 2017-07-29 ENCOUNTER — Other Ambulatory Visit: Payer: Self-pay

## 2017-07-30 ENCOUNTER — Other Ambulatory Visit: Payer: Self-pay | Admitting: Family Medicine

## 2017-07-30 ENCOUNTER — Ambulatory Visit: Payer: BLUE CROSS/BLUE SHIELD | Admitting: Family Medicine

## 2017-07-30 VITALS — BP 132/90 | HR 77 | Temp 98.4°F | Ht 64.0 in | Wt 230.0 lb

## 2017-07-30 DIAGNOSIS — R0602 Shortness of breath: Secondary | ICD-10-CM | POA: Diagnosis not present

## 2017-07-30 MED ORDER — IPRATROPIUM BROMIDE 0.02 % IN SOLN
0.5000 mg | Freq: Once | RESPIRATORY_TRACT | Status: AC
  Administered 2017-07-30: 0.5 mg via RESPIRATORY_TRACT

## 2017-07-30 MED ORDER — ALBUTEROL SULFATE (2.5 MG/3ML) 0.083% IN NEBU
2.5000 mg | INHALATION_SOLUTION | Freq: Once | RESPIRATORY_TRACT | Status: AC
  Administered 2017-07-30: 2.5 mg via RESPIRATORY_TRACT

## 2017-07-30 MED ORDER — ALBUTEROL SULFATE HFA 108 (90 BASE) MCG/ACT IN AERS: 2.0000 | INHALATION_SPRAY | Freq: Four times a day (QID) | RESPIRATORY_TRACT | 0 refills | Status: DC | PRN

## 2017-07-30 NOTE — Assessment & Plan Note (Addendum)
Patient presents today with shortness of breath, cough, in the setting of her recent diagnosis for right upper lobe pneumonia.  Patient is completed 5 days of medicine treatment.  Patient concerned about continued symptoms.  On exam patient has diffuse wheezing bilaterally in upper and lower lung field.  Patient is a smoker but does not have diagnosis of COPD.  Given her recent antibiotic completion and lack of fever and diffuse wheezing on exam was suspicious for COPD exacerbation in the setting of recent pneumonia.  Will administer a DuoNeb treatment in clinic today.  Will prescribe albuterol to be used as needed at home for the next few days.  If symptoms are not improving in the next 3 to 4 days patient will return for further evaluation and possible initiation of antibiotic treatment.  Could also consider steroids. Patient response to DuoNeb treatment in clinic with complete resolution of wheezing. --Prescribed albuterol inhaler to be used as needed for the next 2 days --Follow-up in clinic if symptoms do not improve or worsens. --Patient will likely need PFT and discuss need for controller meds.

## 2017-07-30 NOTE — Progress Notes (Addendum)
   Subjective:    Patient ID: Lisa Sullivan, female    DOB: 03/24/1966, 52 y.o.   MRN: [id number]   CC: Shortness of breath, cough, weakness   HPI: Patient is 52 yo female who presents today complaining of cough, fatigue. Patient was recently diagnosed with RUL pneumonia and started on azithromycin which she completed course yesterday.  Patient denies any recent fever but continues to have persistent cough, weakness, and nausea.  Patient denies any increased work of breathing.  Patient is concerned about symptoms after completing antibiotic treatment.  Patient endorses mild abdominal pain and reports some loose stools for the past 2 days.  Smoking status reviewed   ROS: all other systems were reviewed and are negative other than in the HPI   Past Medical History:  Diagnosis Date  . Gall stone   . Hypertension     Past Surgical History:  Procedure Laterality Date  . CESAREAN SECTION    . TUBAL LIGATION      Past medical history, surgical, family, and social history reviewed and updated in the EMR as appropriate.  Objective:  BP 132/90   Pulse 77   Temp 98.4 F (36.9 C) (Oral)   Ht 5\' 4"  (1.626 m)   Wt 230 lb (104.3 kg)   SpO2 96%   BMI 39.48 kg/m   Vitals and nursing note reviewed  General:.  Tired appearing woman, NAD, pleasant, able to participate in exam Cardiac: RRR, normal heart sounds, no murmurs. 2+ radial and PT pulses bilaterally Respiratory: Diffuse wheezing in all lung fields bilaterally, normal effort, Abdomen: soft, nontender, nondistended, no hepatic or splenomegaly, +BS Extremities: no edema or cyanosis. WWP. Skin: warm and dry, no rashes noted Neuro: alert and oriented x4, no focal deficits Psych: Normal affect and mood   Assessment & Plan:    Shortness of breath Patient presents today with shortness of breath, cough, in the setting of her recent diagnosis for right upper lobe pneumonia.  Patient is completed 5 days of medicine treatment.   Patient concerned about continued symptoms.  On exam patient has diffuse wheezing bilaterally in upper and lower lung field.  Patient is a smoker but does not have diagnosis of COPD.  Given her recent antibiotic completion and lack of fever and diffuse wheezing on exam was suspicious for COPD exacerbation in the setting of recent pneumonia.  Will administer a DuoNeb treatment in clinic today.  Will prescribe albuterol to be used as needed at home for the next few days.  If symptoms are not improving in the next 3 to 4 days patient will return for further evaluation and possible initiation of antibiotic treatment.  Could also consider steroids. Patient response to DuoNeb treatment in clinic with complete resolution of wheezing. --Prescribed albuterol inhaler to be used as needed for the next 2 days --Follow-up in clinic if symptoms do not improve or worsens. --Patient will likely need PFT and discuss need for controller meds.    Marjie Skiff, MD [place] PGY-2

## 2017-07-31 ENCOUNTER — Telehealth: Payer: Self-pay

## 2017-07-31 MED ORDER — PANTOPRAZOLE SODIUM 40 MG PO TBEC: 40.0000 mg | DELAYED_RELEASE_TABLET | Freq: Every day | ORAL | 1 refills | Status: DC

## 2017-07-31 NOTE — Telephone Encounter (Signed)
Received fax from [facility] requesting prior authorization of Albuterol inhler. PA submitted via CoverMyMeds. Status pending. Will check status in 24 hours. Danley Danker, RN [facility] RN)

## 2017-08-01 NOTE — Telephone Encounter (Addendum)
Received fax from [facility] with additional clinical questions. Clinical questions indicate that [facility] name is covered. Phone call to pharmacist at [facility] who confirmed that plan will pay for Proair but this information was not on PA sent. Pharmacist will re-run Rx as name brand. Danley Danker, RN [facility] RN)

## 2017-08-05 ENCOUNTER — Other Ambulatory Visit: Payer: Self-pay | Admitting: Family Medicine

## 2017-08-05 NOTE — Progress Notes (Signed)
error 

## 2017-09-27 ENCOUNTER — Other Ambulatory Visit: Payer: Self-pay | Admitting: Family Medicine

## 2017-10-08 ENCOUNTER — Other Ambulatory Visit: Payer: Self-pay

## 2017-10-08 NOTE — Telephone Encounter (Signed)
Needs refill of HCTZ until upcoming appt on 10/28/17.  Danley Danker, RN [facility] RN)

## 2017-10-09 MED ORDER — HYDROCHLOROTHIAZIDE 12.5 MG PO CAPS: 25.0000 mg | ORAL_CAPSULE | Freq: Every morning | ORAL | 2 refills | Status: DC

## 2017-10-28 ENCOUNTER — Ambulatory Visit (INDEPENDENT_AMBULATORY_CARE_PROVIDER_SITE_OTHER): Payer: BLUE CROSS/BLUE SHIELD | Admitting: Family Medicine

## 2017-10-28 ENCOUNTER — Encounter: Payer: Self-pay | Admitting: Family Medicine

## 2017-10-28 ENCOUNTER — Other Ambulatory Visit: Payer: Self-pay

## 2017-10-28 VITALS — BP 130/78 | HR 88 | Ht 64.0 in | Wt 233.0 lb

## 2017-10-28 DIAGNOSIS — R7303 Prediabetes: Secondary | ICD-10-CM

## 2017-10-28 LAB — POCT GLYCOSYLATED HEMOGLOBIN (HGB A1C): HbA1c, POC (controlled diabetic range): 6.2 % (ref 0.0–7.0)

## 2017-10-28 MED ORDER — HYDROCHLOROTHIAZIDE 12.5 MG PO CAPS: 25.0000 mg | ORAL_CAPSULE | Freq: Every morning | ORAL | 2 refills | Status: DC

## 2017-10-28 MED ORDER — METFORMIN HCL 500 MG PO TABS
500.0000 mg | ORAL_TABLET | Freq: Two times a day (BID) | ORAL | 3 refills | Status: DC
Start: 2017-10-28 — End: 2018-10-07

## 2017-10-28 MED ORDER — RANITIDINE HCL 150 MG PO CAPS: 150.0000 mg | ORAL_CAPSULE | Freq: Two times a day (BID) | ORAL | 0 refills | Status: DC

## 2017-10-28 NOTE — Patient Instructions (Signed)
It was great seeing you today! We have addressed the following issues today  1. Medications have been refilled as discussed 2. Work on [employer] and exercise given your prediabetic status. It is very important 3. Appoinmtent has been made with your regular doctor for next week.  If we did any lab work today, and the results require attention, either me or my nurse will get in touch with you. If everything is normal, you will get a letter in mail and a message via . If you don't hear from [place] in two weeks, please give [place] a call. Otherwise, we look forward to seeing you again at your next visit. If you have any questions or concerns before then, please call the clinic at [phone].  Please bring all your medications to every doctors visit  Sign up for My Chart to have easy access to your labs results, and communication with your Primary care physician. Please ask Front Desk for some assistance.   Please check-out at the front desk before leaving the clinic.    Take Care,   Dr. Andy Gauss   Diet Recommendations for Diabetes   Starchy (carb) foods: Bread, rice, pasta, potatoes, corn, cereal, grits, crackers, bagels, muffins, all baked goods.  (Fruits, milk, and yogurt also have carbohydrate, but most of these foods will not spike your blood sugar as the starchy foods will.)  A few fruits do cause high blood sugars; use small portions of bananas (limit to 1/2 at a time), grapes, watermelon, oranges, and most tropical fruits.    Protein foods: Meat, fish, poultry, eggs, dairy foods, and beans such as pinto and kidney beans (beans also provide carbohydrate).   1. Eat at least 3 meals and 1-2 snacks per day. Never go more than 4-5 hours while awake without eating. Eat breakfast within the first hour of getting up.   2. Limit starchy foods to TWO per meal and ONE per snack. ONE portion of a starchy  food is equal to the following:   - ONE slice of bread (or its equivalent, such as half of a  hamburger bun).   - 1/2 cup of a "scoopable" starchy food such as potatoes or rice.   - 15 grams of carbohydrate as shown on food label.  3. Include at every meal: a protein food, a carb food, and vegetables and/or fruit.   - Obtain twice the volume of veg's as protein or carbohydrate foods for both lunch and dinner.   - Fresh or frozen veg's are best.   - Keep frozen veg's on hand for a quick vegetable serving.

## 2017-10-28 NOTE — Assessment & Plan Note (Signed)
A1c today 6.2 down from 6.4 a year ago.  Patient has been nonadherent to her metformin regimen.  She was supposed to be on metformin 750 mg XR.  Given slight improvement in A1c, will switch patient to 500 mg daily.  Discussed therapeutic lifestyle changes.  We will follow-up on A1c in 3 months.

## 2017-10-28 NOTE — Progress Notes (Signed)
   Subjective:    Patient ID: Lisa Sullivan, female    DOB: [birth year], 52 y.o.   MRN: [id number]   CC: Medications refill  HPI: Patient is a 52 yo female who presents today for medication refill. Patient has no acute complaints and has been doing well. She has been taking her metformin 750 mg XR intermittently "as needed". Last A1c was 1 year ago. She denies any polyuria and polydipsia.   Smoking status reviewed   ROS: all other systems were reviewed and are negative other than in the HPI   Past Medical History:  Diagnosis Date  . Gall stone   . Hypertension     Past Surgical History:  Procedure Laterality Date  . CESAREAN SECTION    . TUBAL LIGATION      Past medical history, surgical, family, and social history reviewed and updated in the EMR as appropriate.  Objective:  BP 130/78   Pulse 88   Ht 5\' 4"  (1.626 m)   Wt 233 lb (105.7 kg)   SpO2 94%   BMI 39.99 kg/m   Vitals and nursing note reviewed  General: NAD, pleasant, able to participate in exam Cardiac: RRR, normal heart sounds, no murmurs. 2+ radial and PT pulses bilaterally Respiratory: CTAB, normal effort, No wheezes, rales or rhonchi Abdomen: soft, nontender, nondistended, no hepatic or splenomegaly, +BS Extremities: no edema or cyanosis. WWP. Skin: warm and dry, no rashes noted Neuro: alert and oriented x4, no focal deficits Psych: Normal affect and mood   Assessment & Plan:   Prediabetes A1c today 6.2 down from 6.4 a year ago.  Patient has been nonadherent to her metformin regimen.  She was supposed to be on metformin 750 mg XR.  Given slight improvement in A1c, will switch patient to 500 mg daily.  Discussed therapeutic lifestyle changes.  We will follow-up on A1c in 3 months.  Reflux, chronic, controlled Patient has been on Protonix 40 mg daily chronically.  Patient reports that she does not always need it.  Discussed risks and benefits of using PPI long-term.  Patient will adjust diet.  Will  switch to ranitidine 150 mg twice daily.  Follow-up with PCP as needed.   Marjie Skiff, MD [place] PGY-3

## 2017-11-05 ENCOUNTER — Ambulatory Visit (INDEPENDENT_AMBULATORY_CARE_PROVIDER_SITE_OTHER): Payer: BLUE CROSS/BLUE SHIELD | Admitting: Family Medicine

## 2017-11-05 ENCOUNTER — Other Ambulatory Visit: Payer: Self-pay

## 2017-11-05 ENCOUNTER — Encounter: Payer: Self-pay | Admitting: Family Medicine

## 2017-11-05 VITALS — BP 132/74 | HR 79 | Temp 98.2°F | Ht 64.0 in | Wt 230.0 lb

## 2017-11-05 DIAGNOSIS — Z1239 Encounter for other screening for malignant neoplasm of breast: Secondary | ICD-10-CM

## 2017-11-05 DIAGNOSIS — I10 Essential (primary) hypertension: Secondary | ICD-10-CM | POA: Diagnosis not present

## 2017-11-05 DIAGNOSIS — R7303 Prediabetes: Secondary | ICD-10-CM | POA: Diagnosis not present

## 2017-11-05 DIAGNOSIS — Z1231 Encounter for screening mammogram for malignant neoplasm of breast: Secondary | ICD-10-CM | POA: Diagnosis not present

## 2017-11-05 LAB — GLUCOSE, POCT (MANUAL RESULT ENTRY): POC GLUCOSE: 136 mg/dL — AB (ref 70–99)

## 2017-11-05 NOTE — Progress Notes (Signed)
   Subjective:    Patient ID: Lisa Sullivan, female    DOB: Nov 24, 1965, 52 y.o.   MRN: [id number]   CC: meet new PCP  HPI:  Hypertension:  Patient reports that she was unable to pick up her HCTZ from the pharmacy because she "had 2 prescriptions". She has not been taking it because of this.   She reports that she thinks she has fluid accumulating and this pill helps. She will pick it up today if I call the pharmacy  Prediabetes:  Patient has started taking the metformin 500mg  and has been working hard on her diet.  She did have a soda this morning, but has stopped eating fried foods.  She is trying to lose weight and does not want to be on medications.   Health maintenance: Patient due for Mammogram and pap smear in October.  Smoking status reviewed  Review of Systems Per HPI   Patient Active Problem List   Diagnosis Date Noted  . Shortness of breath 07/30/2017  . Wrist pain, right 04/29/2017  . Right knee pain 08/16/2016  . Prediabetes 06/18/2016  . Left knee pain 04/26/2015  . Depression 01/23/2015  . Headache 12/15/2014  . Healthcare maintenance 12/15/2014  . Hypertension 02/28/2011  . Goiter 02/28/2011  . OBESITY, UNSPECIFIED 03/27/2010  . SMOKER 03/27/2010  . 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 WWP. Skin: warm and dry, no rashes noted Neuro: alert and oriented, no focal deficits Psych: normal affect  Assessment & Plan:    Hypertension Patient to pick up and continue taking her HCTZ. BP 132/74 today, which is mild. Will continue to monitor.   Prediabetes Patient to continue metformin 500mg  daily and lifestyle changes. Educated  on lifestyle modifications.   Health maintenance: Patient to schedule pap smear and mammogram    [place] Chancellor Vanderloop, [place] Medicine Resident PGY-2

## 2017-11-05 NOTE — Patient Instructions (Addendum)
Thank you for coming to see me today. It was a pleasure! Today we talked about:   Please make the appointment with the front for your pap smear.  Please schedule mammogram for October.  Please follow-up with 6 months or sooner as needed.  If you have any questions or concerns, please do not hesitate to call the office at [phone].  Take Care,   [place] Winni Ehrhard, DO   Diet Recommendations for Diabetes   Starchy (carb) foods: Bread, rice, pasta, potatoes, corn, cereal, grits, crackers, bagels, muffins, all baked goods.  (Fruits, milk, and yogurt also have carbohydrate, but most of these foods will not spike your blood sugar as most starchy foods will.)  A few fruits do cause high blood sugars; use small portions of bananas (limit to 1/2 at a time), grapes, watermelon, oranges, and most tropical fruits.    Protein foods: Meat, fish, poultry, eggs, dairy foods, and beans such as pinto and kidney beans (beans also provide carbohydrate).   1. Eat at least 3 meals and 1-2 snacks per day. Never go more than 4-5 hours while awake without eating. Eat breakfast within the first hour of getting up.   2. Limit starchy foods to TWO per meal and ONE per snack. ONE portion of a starchy  food is equal to the following:   - ONE slice of bread (or its equivalent, such as half of a hamburger bun).   - 1/2 cup of a "scoopable" starchy food such as potatoes or rice.   - 15 grams of Total Carbohydrate as shown on food label.  3. Include at every meal: a protein food, a carb food, and vegetables and/or fruit.   - Obtain twice the volume of veg's as protein or carbohydrate foods for both lunch and dinner.   - Fresh or frozen veg's are best.   - Keep frozen veg's on hand for a quick vegetable serving.

## 2017-11-06 NOTE — Assessment & Plan Note (Signed)
Patient to pick up and continue taking her HCTZ. BP 132/74 today, which is mild. Will continue to monitor.

## 2017-11-06 NOTE — Assessment & Plan Note (Signed)
Patient to continue metformin 500mg  daily and lifestyle changes. Educated on lifestyle modifications.

## 2017-11-10 ENCOUNTER — Other Ambulatory Visit: Payer: Self-pay | Admitting: Family Medicine

## 2017-11-10 DIAGNOSIS — I10 Essential (primary) hypertension: Secondary | ICD-10-CM

## 2017-12-16 ENCOUNTER — Ambulatory Visit: Payer: BLUE CROSS/BLUE SHIELD | Admitting: Family Medicine

## 2017-12-18 ENCOUNTER — Ambulatory Visit: Payer: BLUE CROSS/BLUE SHIELD | Admitting: Family Medicine

## 2017-12-30 ENCOUNTER — Ambulatory Visit
Admission: RE | Admit: 2017-12-30 | Discharge: 2017-12-30 | Disposition: A | Discharge status: Home / Self Care | Payer: BLUE CROSS/BLUE SHIELD | Source: Ambulatory Visit | Attending: Family Medicine | Admitting: Family Medicine

## 2017-12-30 DIAGNOSIS — Z1239 Encounter for other screening for malignant neoplasm of breast: Secondary | ICD-10-CM

## 2018-01-22 ENCOUNTER — Other Ambulatory Visit (HOSPITAL_COMMUNITY)
Admission: RE | Admit: 2018-01-22 | Discharge: 2018-01-22 | Disposition: A | Discharge status: Home / Self Care | Payer: BLUE CROSS/BLUE SHIELD | Source: Ambulatory Visit | Attending: Family Medicine | Admitting: Family Medicine

## 2018-01-22 ENCOUNTER — Ambulatory Visit (INDEPENDENT_AMBULATORY_CARE_PROVIDER_SITE_OTHER): Payer: BLUE CROSS/BLUE SHIELD | Admitting: Family Medicine

## 2018-01-22 VITALS — BP 160/85 | HR 83 | Temp 98.3°F | Wt 234.8 lb

## 2018-01-22 DIAGNOSIS — Z01419 Encounter for gynecological examination (general) (routine) without abnormal findings: Secondary | ICD-10-CM

## 2018-01-22 DIAGNOSIS — Z0000 Encounter for general adult medical examination without abnormal findings: Secondary | ICD-10-CM | POA: Diagnosis not present

## 2018-01-22 DIAGNOSIS — I10 Essential (primary) hypertension: Secondary | ICD-10-CM

## 2018-01-22 DIAGNOSIS — Z23 Encounter for immunization: Secondary | ICD-10-CM | POA: Diagnosis not present

## 2018-01-22 LAB — POCT WET PREP (WET MOUNT)
CLUE CELLS WET PREP WHIFF POC: POSITIVE
Trichomonas Wet Prep HPF POC: ABSENT

## 2018-01-22 MED ORDER — RANITIDINE HCL 150 MG PO CAPS: 150.0000 mg | ORAL_CAPSULE | Freq: Two times a day (BID) | ORAL | 0 refills | Status: DC

## 2018-01-22 MED ORDER — HYDROCHLOROTHIAZIDE 25 MG PO TABS: 25.0000 mg | ORAL_TABLET | Freq: Every day | ORAL | 3 refills | Status: DC

## 2018-01-22 NOTE — Progress Notes (Signed)
  Subjective:    Patient ID: Lisa Sullivan, female    DOB: April 21, 1965, 51 y.o.   MRN: [id number]   CC: pap smear  HPI: Health maintenance: Patient here for her pap smear today.  Patient would also like to have wet prep with GC/chlamydia testing.  Patient denies any abnormal bleeding, cramping.  Patient otherwise up-to-date on all other health maintenance items.  Patient will receive flu shot today  Hypertension: - Medications: HCTZ - Compliance: Patient reports that she has run out of her medications and did not take any this morning - Checking BP at home: No - Denies any SOB, CP, vision changes, LE edema, medication SEs, or symptoms of hypotension -Patient believes that her blood pressure is elevated because she is having issues with her daughter.  She reports that this is causing her a lot of stress and she believes this is why her blood pressure is elevated.   Smoking status reviewed  ROS: 10 point ROS is otherwise negative, except as mentioned in HPI  Patient Active Problem List   Diagnosis Date Noted  . Shortness of breath 07/30/2017  . Wrist pain, right 04/29/2017  . Right knee pain 08/16/2016  . Prediabetes 06/18/2016  . Left knee pain 04/26/2015  . Depression 01/23/2015  . Headache 12/15/2014  . Healthcare maintenance 12/15/2014  . Hypertension 02/28/2011  . Goiter 02/28/2011  . OBESITY, UNSPECIFIED 03/27/2010  . SMOKER 03/27/2010  . 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  Vaginal mucosa pink, moist, normal rugae.  Nonfriable cervix without lesions, no discharge or bleeding noted on speculum exam.  Bimanual exam revealed normal,  nongravid uterus.  No cervical motion tenderness. No adnexal masses bilaterally. Exam performed in the presence of a chaperone. Extremities: no edema or cyanosis. WWP. Skin: warm and dry, no rashes noted Neuro: alert and oriented, no focal deficits Psych: normal affect  Assessment & Plan:   Hypertension Patient reports that she has not been taking her HCTZ because she ran out of her prescription.  Have sent refill into pharmacy for her to be able to take 1 tablet daily rather than 2 tablets.  Patient also to return next week for a blood pressure check given that her blood pressure today was 160/85.  At recent visit blood pressure was within normal limits at 132/74.  Encouraged patient to take her medication.  Patient reports that she is she is been having a lot going on in her life and that is why she was unable to get her medication.  Healthcare maintenance Pap smear performed today.  Patient received flu shot.  We will follow-up with patient regarding Pap smear and GC/chlamydia results.   [place] Shailyn Weyandt, DO Family Medicine Resident PGY-2

## 2018-01-22 NOTE — Patient Instructions (Signed)
Thank you for coming to see me today. It was a pleasure! Today we talked about:   We will call you with your results. I have refilled your medications, please start taking your blood pressure medication daily.   Please return for a blood pressure check in 1 week after taking your medication.   Please follow-up with me in 6 months or sooner as needed.  If you have any questions or concerns, please do not hesitate to call the office at [phone].  Take Care,   [place] Samaiya Awadallah, DO

## 2018-01-23 LAB — CERVICOVAGINAL ANCILLARY ONLY
CHLAMYDIA, DNA PROBE: NEGATIVE
Neisseria Gonorrhea: NEGATIVE

## 2018-01-23 NOTE — Assessment & Plan Note (Signed)
Pap smear performed today.  Patient received flu shot.  We will follow-up with patient regarding Pap smear and GC/chlamydia results.

## 2018-01-23 NOTE — Assessment & Plan Note (Signed)
Patient reports that she has not been taking her HCTZ because she ran out of her prescription.  Have sent refill into pharmacy for her to be able to take 1 tablet daily rather than 2 tablets.  Patient also to return next week for a blood pressure check given that her blood pressure today was 160/85.  At recent visit blood pressure was within normal limits at 132/74.  Encouraged patient to take her medication.  Patient reports that she is she is been having a lot going on in her life and that is why she was unable to get her medication.

## 2018-01-27 LAB — CYTOLOGY - PAP
Adequacy: ABSENT
DIAGNOSIS: NEGATIVE
HPV (WINDOPATH): NOT DETECTED

## 2018-03-09 ENCOUNTER — Other Ambulatory Visit: Payer: Self-pay

## 2018-03-09 ENCOUNTER — Telehealth: Payer: Self-pay | Admitting: *Deleted

## 2018-03-09 ENCOUNTER — Ambulatory Visit (INDEPENDENT_AMBULATORY_CARE_PROVIDER_SITE_OTHER): Payer: BLUE CROSS/BLUE SHIELD | Admitting: Family Medicine

## 2018-03-09 ENCOUNTER — Encounter: Payer: Self-pay | Admitting: Family Medicine

## 2018-03-09 VITALS — BP 136/82 | HR 67 | Temp 98.7°F | Ht 64.0 in | Wt 238.2 lb

## 2018-03-09 DIAGNOSIS — I10 Essential (primary) hypertension: Secondary | ICD-10-CM | POA: Diagnosis not present

## 2018-03-09 DIAGNOSIS — R05 Cough: Secondary | ICD-10-CM | POA: Diagnosis not present

## 2018-03-09 DIAGNOSIS — R059 Cough, unspecified: Secondary | ICD-10-CM

## 2018-03-09 MED ORDER — FLUTICASONE PROPIONATE 50 MCG/ACT NA SUSP: 2.0000 | Freq: Every day | NASAL | 1 refills | Status: DC

## 2018-03-09 MED ORDER — CETIRIZINE HCL 10 MG PO TABS: 10.0000 mg | ORAL_TABLET | Freq: Every day | ORAL | 1 refills | Status: DC

## 2018-03-09 NOTE — Patient Instructions (Addendum)
I think you have allergies causing your cough. Take zyrtec every day, and flonase (nasal spray) every day, and let's see if this helps.  Blood pressure was fine when we rechecked it.  Be well, Dr. Ardelia Mems

## 2018-03-09 NOTE — Telephone Encounter (Signed)
Pt calls because her BPs have been running high today.  10am: 159/111 10:15am: 159/106  2:20 pm: 165/115  She denies CP and SOB.  She states that she is having blurry vision, but "that is normal"  She has taken her HCTZ 25mg  @ 6am.  Dr. Ardelia Mems has opening @ 3:30.  Placed pt in that spot.

## 2018-03-09 NOTE — Progress Notes (Signed)
Date of Visit: 03/09/2018   HPI:  Patient presents for a same day appointment to discuss elevated blood pressure.  Currently taking HCTZ 25mg  daily. Tolerating this well. Has some blurry vision which she says is normal for her, uses allergy visine drops for that which helps. Otherwise is mostly bothered by a cough which she's had for about 2 weeks. Has scratchy itchy throat and also feels drainage in throat. Tolerating oral intake. No fevers.  ROS: See HPI  [place]: history of hypertension, prediabetes, dyshidrotic eczema, depression, smoking, obesity  PHYSICAL EXAM: BP 136/82   Pulse 67   Temp 98.7 F (37.1 C) (Oral)   Ht 5\' 4"  (1.626 m)   Wt 238 lb 3.2 oz (108 kg)   SpO2 98%   BMI 40.89 kg/m   Gen: no acute distress, cooperative, irritable but consolable affect HEENT: normocephalic, atraumatic, moist mucous membranes. R tympanic membrane not visualized due to cerumen. L tympanic membrane clear. Oropharynx clear and moist. Nares with some irritation Heart: regular rate and rhythm, no murmur Lungs: clear to auscultation bilaterally, normal work of breathing  Neuro: alert, grossly nonfocal, speech normal  ASSESSMENT/PLAN:  1. Elevated BP - blood pressure normal on recheck today. Suspect cuff was measuring inaccurately earlier as patient reports it was an automated cuff and was used all over her arm, was also during fit of stress/coughing.  2. Cough - suspect allergic rhinitis causing postnasal drip and cough. rx flonase and zyrtec.   FOLLOW UP: Follow up as needed if symptoms worsen or do not improve.   [place]. Ardelia Mems, [place]

## 2018-03-23 ENCOUNTER — Ambulatory Visit (INDEPENDENT_AMBULATORY_CARE_PROVIDER_SITE_OTHER): Payer: BLUE CROSS/BLUE SHIELD | Admitting: Physician Assistant

## 2018-03-23 ENCOUNTER — Ambulatory Visit (INDEPENDENT_AMBULATORY_CARE_PROVIDER_SITE_OTHER): Payer: Self-pay

## 2018-03-23 ENCOUNTER — Encounter (INDEPENDENT_AMBULATORY_CARE_PROVIDER_SITE_OTHER): Payer: Self-pay | Admitting: Physician Assistant

## 2018-03-23 VITALS — Ht 64.0 in | Wt 238.2 lb

## 2018-03-23 DIAGNOSIS — M25561 Pain in right knee: Secondary | ICD-10-CM | POA: Diagnosis not present

## 2018-03-23 DIAGNOSIS — M1711 Unilateral primary osteoarthritis, right knee: Secondary | ICD-10-CM

## 2018-03-23 MED ORDER — TRAMADOL HCL 50 MG PO TABS: 50.0000 mg | ORAL_TABLET | Freq: Four times a day (QID) | ORAL | 0 refills | Status: DC | PRN

## 2018-03-23 NOTE — Progress Notes (Signed)
Office Visit Note   Patient: Lisa Sullivan           Date of Birth: Apr 23, 1965           MRN: [id number] Visit Date: 03/23/2018              Requested by: Shirley, [place], [address], [place] [address] PCP: Shirley, [place], DO  Chief Complaint  Patient presents with  . Right Knee - Pain      HPI: The patient is a 52 year old woman who is seen for follow-up of her right knee osteoarthritis.  She reports that she has gotten pain relief in the past with steroid injections last year but reports over the past several months her pain has become nearly unbearable.  She just states that she cannot stand for very long and is very stiff when she sits for longer periods and tries to get up.  She is having trouble going up and down stairs.  Her pain is daily and very severe at times.  Assessment & Plan: Visit Diagnoses:  1. Unilateral primary osteoarthritis, right knee   2. Right knee pain, unspecified chronicity     Plan: Counseled the patient that she now has bone-on-bone degenerative change in the right knee and that she likely will require a knee replacement at some point.  She would like to try a steroid injection today and this was completed without difficulty.  Follow-Up Instructions: Return if symptoms worsen or fail to improve.   Ortho Exam  Patient is alert, oriented, no adenopathy, well-dressed, normal affect, normal respiratory effort. The right knee has a small effusion.  She has full extension and approximately 110 degrees of flexion but severe pain on both and extension and and flexion.  She is tender over the medial joint line to palpation as well as over the posterior knee.  There is no instability appreciated.  She is neurovascularly intact distally.  Imaging: No results found. No images are attached to the encounter.  Labs: Lab Results  Component Value Date   HGBA1C 6.2 10/28/2017   HGBA1C 6.4 09/02/2016   HGBA1C 6.3 06/18/2016   REPTSTATUS  02/26/2007 FINAL 02/24/2007   GRAMSTAIN  02/24/2007    NO WBC SEEN RARE SQUAMOUS EPITHELIAL CELLS PRESENT FEW GRAM POSITIVE COCCI IN CLUSTERS IN PAIRS   CULT  02/24/2007    MODERATE METHICILLIN RESISTANT STAPHYLOCOCCUS AUREUS Note: RIFAMPIN AND GENTAMICIN SHOULD NOT BE USED AS SINGLE DRUGS FOR TREATMENT OF STAPH INFECTIONS. Contact laboratory if Clindamycin results needed. [phone] CRITICAL RESULT CALLED TO, READ BACK BY AND VERIFIED WITH: TONY SILVIANO 02/26/07 1020 BY  SMITHERSJ   LABORGA METHICILLIN RESISTANT STAPHYLOCOCCUS AUREUS 02/24/2007     Lab Results  Component Value Date   ALBUMIN 4.2 09/02/2016   ALBUMIN 4.1 08/02/2013   ALBUMIN 3.9 08/03/2012    Body mass index is 40.89 kg/m.  Orders:  Orders Placed This Encounter  Procedures  . Large Joint Inj: R knee  . XR Knee 1-2 Views Right   Meds ordered this encounter  Medications  . traMADol (ULTRAM) 50 MG tablet    Sig: Take 1 tablet (50 mg total) by mouth every 6 (six) hours as needed for moderate pain or severe pain.    Dispense:  30 tablet    Refill:  0     Procedures: Large Joint Inj: R knee on 03/23/2018 3:16 PM Indications: pain and diagnostic evaluation Details: 22 G 1.5 in needle, anteromedial approach  Arthrogram: No  Medications: 5 mL lidocaine 1 %; 40 mg methylPREDNISolone acetate 40 MG/ML Outcome: tolerated well, no immediate complications Procedure, treatment alternatives, risks and benefits explained, specific risks discussed. Consent was given by the patient. Immediately prior to procedure a time out was called to verify the correct patient, procedure, equipment, support staff and site/side marked as required. Patient was prepped and draped in the usual sterile fashion.      Clinical Data: No additional findings.  ROS:  All other systems negative, except as noted in the HPI. Review of Systems  Objective: Vital Signs: Ht 5\' 4"  (1.626 m)   Wt 238 lb 3.2 oz (108 kg)   BMI 40.89 kg/m     Specialty Comments:  No specialty comments available.  PMFS History: Patient Active Problem List   Diagnosis Date Noted  . Right knee pain 08/16/2016  . Prediabetes 06/18/2016  . Left knee pain 04/26/2015  . Depression 01/23/2015  . Headache 12/15/2014  . Healthcare maintenance 12/15/2014  . Hypertension 02/28/2011  . Goiter 02/28/2011  . OBESITY, UNSPECIFIED 03/27/2010  . SMOKER 03/27/2010  . 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 Gall stone   . Hypertension     Family History  Problem Relation Age of Onset  . Diabetes Mother   . Stroke Father   . Breast cancer Paternal Grandmother   . Colon cancer Neg Hx   . Esophageal cancer Neg Hx   . Stomach cancer Neg Hx     Past Surgical History:  Procedure Laterality Date  . CESAREAN SECTION    . TUBAL LIGATION     Social History   Occupational History  . Not on file  Tobacco Use  . Smoking status: Current Some Day Smoker    Packs/day: 0.50  . Smokeless tobacco: Never Used  Substance and Sexual Activity  . Alcohol use: Yes    Comment: socially   . Drug use: No  . Sexual activity: Not on file

## 2018-03-25 MED ORDER — LIDOCAINE HCL 1 % IJ SOLN
5.0000 mL | INTRAMUSCULAR | Status: AC | PRN
Start: 2018-03-23 — End: 2018-03-23
  Administered 2018-03-23: 5 mL

## 2018-03-25 MED ORDER — METHYLPREDNISOLONE ACETATE 40 MG/ML IJ SUSP
40.0000 mg | INTRAMUSCULAR | Status: AC | PRN
  Administered 2018-03-23: 40 mg via INTRA_ARTICULAR

## 2018-04-18 ENCOUNTER — Other Ambulatory Visit: Payer: Self-pay

## 2018-04-18 ENCOUNTER — Ambulatory Visit (HOSPITAL_COMMUNITY)
Admission: EM | Admit: 2018-04-18 | Discharge: 2018-04-18 | Disposition: A | Discharge status: Home / Self Care | Payer: BLUE CROSS/BLUE SHIELD | Attending: Radiology | Admitting: Radiology

## 2018-04-18 ENCOUNTER — Encounter (HOSPITAL_COMMUNITY): Payer: Self-pay | Admitting: Emergency Medicine

## 2018-04-18 ENCOUNTER — Ambulatory Visit (INDEPENDENT_AMBULATORY_CARE_PROVIDER_SITE_OTHER): Payer: BLUE CROSS/BLUE SHIELD

## 2018-04-18 DIAGNOSIS — M25561 Pain in right knee: Secondary | ICD-10-CM

## 2018-04-18 DIAGNOSIS — M545 Low back pain: Secondary | ICD-10-CM | POA: Diagnosis not present

## 2018-04-18 MED ORDER — DICLOFENAC SODIUM 75 MG PO TBEC: 75.0000 mg | DELAYED_RELEASE_TABLET | Freq: Two times a day (BID) | ORAL | 0 refills | Status: DC

## 2018-04-18 MED ORDER — CYCLOBENZAPRINE HCL 5 MG PO TABS: 10.0000 mg | ORAL_TABLET | Freq: Two times a day (BID) | ORAL | 0 refills | Status: AC | PRN
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
## 2018-05-26 ENCOUNTER — Emergency Department (HOSPITAL_COMMUNITY): Payer: BLUE CROSS/BLUE SHIELD

## 2018-05-26 ENCOUNTER — Other Ambulatory Visit: Payer: Self-pay

## 2018-05-26 ENCOUNTER — Emergency Department (HOSPITAL_COMMUNITY)
Admission: EM | Admit: 2018-05-26 | Discharge: 2018-05-26 | Disposition: A | Discharge status: Home / Self Care | Payer: BLUE CROSS/BLUE SHIELD | Attending: Emergency Medicine | Admitting: Emergency Medicine

## 2018-05-26 ENCOUNTER — Other Ambulatory Visit: Payer: Self-pay | Admitting: Family Medicine

## 2018-05-26 ENCOUNTER — Encounter (HOSPITAL_COMMUNITY): Payer: Self-pay | Admitting: Emergency Medicine

## 2018-05-26 DIAGNOSIS — M7918 Myalgia, other site: Secondary | ICD-10-CM | POA: Diagnosis not present

## 2018-05-26 DIAGNOSIS — F17200 Nicotine dependence, unspecified, uncomplicated: Secondary | ICD-10-CM | POA: Diagnosis not present

## 2018-05-26 DIAGNOSIS — Z91040 Latex allergy status: Secondary | ICD-10-CM | POA: Insufficient documentation

## 2018-05-26 DIAGNOSIS — I10 Essential (primary) hypertension: Secondary | ICD-10-CM | POA: Insufficient documentation

## 2018-05-26 DIAGNOSIS — Z7984 Long term (current) use of oral hypoglycemic drugs: Secondary | ICD-10-CM | POA: Insufficient documentation

## 2018-05-26 DIAGNOSIS — R091 Pleurisy: Secondary | ICD-10-CM | POA: Diagnosis not present

## 2018-05-26 DIAGNOSIS — R079 Chest pain, unspecified: Secondary | ICD-10-CM | POA: Diagnosis present

## 2018-05-26 LAB — CBC
HEMATOCRIT: 45.8 % (ref 36.0–46.0)
Hemoglobin: 13.7 g/dL (ref 12.0–15.0)
MCH: 26.8 pg (ref 26.0–34.0)
MCHC: 29.9 g/dL — ABNORMAL LOW (ref 30.0–36.0)
MCV: 89.5 fL (ref 80.0–100.0)
Platelets: 237 10*3/uL (ref 150–400)
RBC: 5.12 MIL/uL — ABNORMAL HIGH (ref 3.87–5.11)
RDW: 13.7 % (ref 11.5–15.5)
WBC: 6.9 10*3/uL (ref 4.0–10.5)
nRBC: 0 % (ref 0.0–0.2)

## 2018-05-26 LAB — I-STAT BETA HCG BLOOD, ED (MC, WL, AP ONLY): I-stat hCG, quantitative: <5 m[IU]/mL (ref <5)

## 2018-05-26 LAB — BASIC METABOLIC PANEL
Anion gap: 10 (ref 5–15)
BUN: 18 mg/dL (ref 6–20)
CHLORIDE: 106 mmol/L (ref 98–111)
CO2: 22 mmol/L (ref 22–32)
Calcium: 9.3 mg/dL (ref 8.9–10.3)
Creatinine, Ser: 0.93 mg/dL (ref 0.44–1.00)
GFR calc Af Amer: >60 mL/min (ref >60)
GFR calc non Af Amer: >60 mL/min (ref >60)
Glucose, Bld: 117 mg/dL — ABNORMAL HIGH (ref 70–99)
Potassium: 4 mmol/L (ref 3.5–5.1)
Sodium: 138 mmol/L (ref 135–145)

## 2018-05-26 LAB — I-STAT TROPONIN, ED
Troponin i, poc: 0.01 ng/mL (ref 0.00–0.08)
Troponin i, poc: 0.01 ng/mL (ref 0.00–0.08)

## 2018-05-26 MED ORDER — NAPROXEN 375 MG PO TABS: 375.0000 mg | ORAL_TABLET | Freq: Two times a day (BID) | ORAL | 0 refills | Status: DC

## 2018-05-26 MED ORDER — METHOCARBAMOL 1000 MG/10ML IJ SOLN: 1000.0000 mg | Freq: Once | INTRAMUSCULAR | Status: DC

## 2018-05-26 MED ORDER — METHOCARBAMOL 500 MG PO TABS: 1000.0000 mg | ORAL_TABLET | Freq: Three times a day (TID) | ORAL | 0 refills | Status: DC | PRN

## 2018-05-26 MED ORDER — METHOCARBAMOL 500 MG PO TABS
1000.0000 mg | ORAL_TABLET | Freq: Once | ORAL | Status: AC
  Administered 2018-05-26: 1000 mg via ORAL
  Filled 2018-05-26: qty 2

## 2018-05-26 MED ORDER — KETOROLAC TROMETHAMINE 60 MG/2ML IM SOLN
60.0000 mg | Freq: Once | INTRAMUSCULAR | Status: AC
  Administered 2018-05-26: 60 mg via INTRAMUSCULAR
  Filled 2018-05-26: qty 2

## 2018-05-26 MED ORDER — HYDROCODONE-ACETAMINOPHEN 5-325 MG PO TABS: 1.0000 | ORAL_TABLET | Freq: Four times a day (QID) | ORAL | 0 refills | Status: DC | PRN

## 2018-05-26 NOTE — ED Notes (Signed)
Got patient undress into a gown on the monitor patient is resting with family at bedside and call bell in reach 

## 2018-05-26 NOTE — ED Notes (Signed)
RN informed Pt can receive visitor  

## 2018-05-26 NOTE — ED Triage Notes (Signed)
Pt in w/central, tight cp x 3 days - states she has had upper back pain x 2 days. Recently sick with a cold, denies cough, is nauseous this am, emesis x 1. Denies sob

## 2018-05-26 NOTE — Discharge Instructions (Signed)
1.  See your family doctor for recheck at the end of the week. 2.  Return to the emergency department if your symptoms are worsening or new symptoms are developing. 3.  Take naproxen twice daily and Robaxin 3 times a day as needed for muscle spasm.  If you need additional pain control you may take a Vicodin every 6 hours.

## 2018-05-26 NOTE — ED Provider Notes (Signed)
[facility] EMERGENCY DEPARTMENT Provider Note   CSN: [id number] Arrival date & time: 05/26/18  0802    History   Chief Complaint Chief Complaint  Patient presents with  . Chest Pain    HPI Lisa Sullivan is a 53 y.o. female.     HPI Patient reports he had an episode of bronchitis about 4 weeks ago.  She reports she got better about 2 weeks ago.  She is not having coughing any longer.  She reports that she started getting pain in her back and shoulder 2 days ago.  She reports she is also felt like there is some tightness in her chest and the upper left side.  No associated shortness of breath.  Pain is worse with pressure in certain areas and movements.  No swelling or pain in the legs.  No history of DVT or PE.  She has been trying a muscle relaxer that had been prescribed to her previously but did not feel like she was getting any improvement. Past Medical History:  Diagnosis Date  . Gall stone   . Hypertension     Patient Active Problem List   Diagnosis Date Noted  . Right knee pain 08/16/2016  . Prediabetes 06/18/2016  . Left knee pain 04/26/2015  . Depression 01/23/2015  . Headache 12/15/2014  . Healthcare maintenance 12/15/2014  . Hypertension 02/28/2011  . Goiter 02/28/2011  . OBESITY, UNSPECIFIED 03/27/2010  . SMOKER 03/27/2010  . 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 CESAREAN SECTION    . TUBAL LIGATION       OB History   No obstetric history on file.      Home Medications    Prior to Admission medications   Medication Sig Start Date End Date Taking? Authorizing Provider  albuterol (PROVENTIL HFA;VENTOLIN HFA) 108 (90 Base) MCG/ACT inhaler Inhale 2 puffs into the lungs every 6 (six) hours as needed for wheezing or shortness of breath. 07/30/17  Yes Diallo, Abdoulaye, MD  cetirizine (ZYRTEC) 10 MG tablet Take 1 tablet (10 mg total) by mouth daily. 03/09/18  Yes Leeanne Rio, MD  fluticasone (FLONASE) 50 MCG/ACT nasal spray Place 2 sprays into both nostrils daily. 03/09/18  Yes Leeanne Rio, MD  hydrochlorothiazide (HYDRODIURIL) 25 MG tablet Take 1 tablet (25 mg total) by mouth daily. 01/22/18  Yes Enid Derry, [place], DO  metFORMIN (GLUCOPHAGE) 500 MG tablet Take 1 tablet (500 mg total) by mouth 2 (two) times daily with a meal. 10/28/17  Yes Diallo, Abdoulaye, MD  diclofenac (VOLTAREN) 75 MG EC tablet Take 1 tablet (75 mg total) by mouth 2 (two) times daily. Patient not taking: Reported on 05/26/2018 04/18/18   Jacqualine Mau, NP  HYDROcodone-acetaminophen (NORCO/VICODIN) 5-325 MG tablet Take 1 tablet by mouth every 6 (six) hours as needed for moderate pain or severe pain. 05/26/18   Charlesetta Shanks, MD  methocarbamol (ROBAXIN) 500 MG tablet Take 2 tablets (1,000 mg total) by mouth every 8 (eight) hours as needed for muscle spasms. 05/26/18   Charlesetta Shanks, MD  naproxen (NAPROSYN) 375 MG tablet Take 1 tablet (375 mg total) by mouth 2 (two) times daily. 05/26/18   Charlesetta Shanks, MD  ranitidine (ZANTAC) 150 MG capsule TAKE ONE CAPSULE BY MOUTH TWICE DAILY 05/26/18   Shirley, [place], DO  traMADol (ULTRAM) 50 MG tablet Take 1 tablet (50 mg total) by mouth every 6 (six) hours as needed for moderate pain or severe  pain. Patient not taking: Reported on 05/26/2018 03/23/18   Rayburn, Neta Mends, PA-C    Family History Family History  Problem Relation Age of Onset  . Diabetes Mother   . Stroke Father   . Breast cancer Paternal Grandmother   . Colon cancer Neg Hx   . Esophageal cancer Neg Hx   . Stomach cancer Neg Hx     Social History Social History   Tobacco Use  . Smoking status: Current Some Day Smoker    Packs/day: 0.50  . Smokeless tobacco: Never Used  Substance Use Topics  . Alcohol use: Yes    Comment: socially   . Drug use: No     Allergies   Bee venom; Latex; and Sulfa antibiotics   Review of Systems Review of Systems 10 Systems  reviewed and are negative for acute change except as noted in the HPI.   Physical Exam Updated Vital Signs BP (!) 160/96   Pulse 70   Temp 98.6 F (37 C) (Oral)   Resp 19   Wt 103.4 kg   SpO2 98%   BMI 39.14 kg/m   Physical Exam Constitutional:      Appearance: She is well-developed.  HENT:     Head: Normocephalic and atraumatic.  Eyes:     Extraocular Movements: Extraocular movements intact.  Neck:     Musculoskeletal: Neck supple.  Cardiovascular:     Rate and Rhythm: Normal rate and regular rhythm.     Heart sounds: Normal heart sounds.  Pulmonary:     Effort: Pulmonary effort is normal.     Breath sounds: Normal breath sounds.     Comments: Patient has significant reproducible pain to palpation along the left trapezius muscle, subscapularis on the left and anterior chest adjacent to the shoulder. Chest:     Chest wall: Tenderness present.  Abdominal:     General: Bowel sounds are normal. There is no distension.     Palpations: Abdomen is soft.     Tenderness: There is no abdominal tenderness.  Musculoskeletal: Normal range of motion.        General: No swelling or tenderness.     Right lower leg: No edema.     Left lower leg: No edema.  Skin:    General: Skin is warm and dry.  Neurological:     Mental Status: She is alert and oriented to person, place, and time.     GCS: GCS eye subscore is 4. GCS verbal subscore is 5. GCS motor subscore is 6.     Coordination: Coordination normal.      ED Treatments / Results  Labs (all labs ordered are listed, but only abnormal results are displayed) Labs Reviewed  BASIC METABOLIC PANEL - Abnormal; Notable for the following components:      Result Value   Glucose, Bld 117 (*)    All other components within normal limits  CBC - Abnormal; Notable for the following components:   RBC 5.12 (*)    MCHC 29.9 (*)    All other components within normal limits  I-STAT TROPONIN, ED  I-STAT BETA HCG BLOOD, ED ([facility], [facility], [facility] ONLY)   I-STAT TROPONIN, ED    EKG EKG Interpretation  Date/Time:  Tuesday May 26 2018 08:08:35 EST Ventricular Rate:  75 PR Interval:  184 QRS Duration: 88 QT Interval:  376 QTC Calculation: 419 R Axis:   68 Text Interpretation:  Normal sinus rhythm Normal ECG no change from previous Confirmed by Charlesetta Shanks (  [id number]) on 05/26/2018 11:12:41 AM   Radiology Dg Chest 2 View  Result Date: 05/26/2018 CLINICAL DATA:  Chest pain. EXAM: CHEST - 2 VIEW COMPARISON:  Chest x-ray dated Jul 24, 2017. FINDINGS: Heart remains at the upper limits of normal in size. Normal mediastinal contours. Normal pulmonary vascularity. No focal consolidation, pleural effusion, or pneumothorax. No acute osseous abnormality. IMPRESSION: No active cardiopulmonary disease. Electronically Signed   By: Titus Dubin M.D.   On: 05/26/2018 08:54    Procedures Procedures (including critical care time)  Medications Ordered in ED Medications  ketorolac (TORADOL) injection 60 mg (60 mg Intramuscular Given 05/26/18 1223)  methocarbamol (ROBAXIN) tablet 1,000 mg (1,000 mg Oral Given 05/26/18 1222)     Initial Impression / Assessment and Plan / ED Course  I have reviewed the triage vital signs and the nursing notes.  Pertinent labs & imaging results that were available during my care of the patient were reviewed by me and considered in my medical decision making (see chart for details).        Patient is clinically well in appearance.  Vital signs are normal.  Patient had a bronchitis several weeks earlier.  At this time findings are suggestive of either pleurisy or musculoskeletal pain and spasm.  Chest x-ray does not show pneumonia and patient's symptoms have resolved without residual fever or cough.  Symptoms have been constant for over 2 days without associated symptoms suggestive of ischemic event.  At this time will treat with anti-inflammatory, muscle relaxer and short course of hydrocodone for pain control.  Patient  is counseled for close follow-up with her PCP.  Return precautions reviewed.  Final Clinical Impressions(s) / ED Diagnoses   Final diagnoses:  Pleurisy  Myofascial muscle pain    ED Discharge Orders         Ordered    naproxen (NAPROSYN) 375 MG tablet  2 times daily     05/26/18 1338    methocarbamol (ROBAXIN) 500 MG tablet  Every 8 hours PRN     05/26/18 1338    HYDROcodone-acetaminophen (NORCO/VICODIN) 5-325 MG tablet  Every 6 hours PRN     05/26/18 1338           Charlesetta Shanks, MD 05/27/18 1418

## 2018-05-29 ENCOUNTER — Ambulatory Visit (INDEPENDENT_AMBULATORY_CARE_PROVIDER_SITE_OTHER): Payer: BLUE CROSS/BLUE SHIELD | Admitting: Family Medicine

## 2018-05-29 ENCOUNTER — Other Ambulatory Visit: Payer: Self-pay

## 2018-05-29 VITALS — BP 132/74 | HR 80 | Temp 97.9°F | Wt 240.0 lb

## 2018-05-29 DIAGNOSIS — M25512 Pain in left shoulder: Secondary | ICD-10-CM | POA: Diagnosis not present

## 2018-05-29 MED ORDER — PREDNISONE 50 MG PO TABS: ORAL_TABLET | ORAL | 0 refills | Status: DC

## 2018-05-29 NOTE — Assessment & Plan Note (Signed)
  Patient very tender over left shoulder musculature, unclear etiology, questioning myositis vs muscle strain, but no improvement with naproxen/muscle relaxant/ or norco. Normal cardiac work up at ED including CXR, EKG, troponin. Will try course of steroids. Advised light ROM exercises. Follow up 1 week if no improvement. Would consider ESR, CRP, or CK at that visit if still in pain.

## 2018-05-29 NOTE — Progress Notes (Signed)
    Subjective:    Patient ID: Lisa Sullivan, female    DOB: 1966/02/29, 53 y.o.   MRN: [id number]   CC: back and arm pain  HPI: patient had acute pain starting Sunday in her left scapula and arm. She then had some reflux the next day and felt bloated. She went to ED Tuesday because the pain was severe and she was worried about heart attack or pneumonia. She had a negative CXR, negative troponin, normal EKG. She was diagnosed with pleurisy and given naproxen, muscle relaxant, and norco.  She reports she has been taking these things as instructed but feels no better. She was unable to go to work yesterday and came in today to figure out whats going on.  She denies injury or trauma to area, no overuse that she can think of. She is able to move shoulder better today than she has been. She has not had any chest pain. She feels slightly nauseated and gassy. No cough, fever, chill, SOB, chest pain. No urinary symptoms.   Smoking status reviewed  Review of Systems   Objective:  BP 132/74   Pulse 80   Temp 97.9 F (36.6 C) (Oral)   Wt 240 lb (108.9 kg)   BMI 41.20 kg/m  Vitals and nursing note reviewed  General: well nourished, in no acute distress HEENT: normocephalic, MMM Neck: supple, non-tender, without lymphadenopathy Cardiac: RRR, clear S1 and S2, no murmurs, rubs, or gallops Respiratory: clear to auscultation bilaterally, no increased work of breathing Extremities: no edema or cyanosis. Full ROM of shoulders bilaterally MSK: tender to palpation of left trapezius muscle Neuro: alert and oriented, no focal deficits   Assessment & Plan:    Acute pain of left shoulder  Patient very tender over left shoulder musculature, unclear etiology, questioning myositis vs muscle strain, but no improvement with naproxen/muscle relaxant/ or norco. Normal cardiac work up at ED including CXR, EKG, troponin. Will try course of steroids. Advised light ROM exercises. Follow up 1 week if no  improvement. Would consider ESR, CRP, or CK at that visit if still in pain.     Return in about 1 week (around 06/05/2018).   Lucila Maine, DO Family Medicine Resident PGY-3

## 2018-05-29 NOTE — Patient Instructions (Signed)
  Let's try a course of steroids for your muscular shoulder/back pain.  If this works, great! If not, please come back next week to discuss more.  If you have any chest pain or shortness of breath please go to the ED immediately.   If you have questions or concerns please do not hesitate to call at [phone].  Lucila Maine, DO PGY-3, [facility] 05/29/2018 2:49 PM

## 2018-07-22 ENCOUNTER — Telehealth: Payer: Self-pay | Admitting: *Deleted

## 2018-07-22 NOTE — Telephone Encounter (Signed)
Patient was exposed to covid19.  She is asymptomatic at this time but will practice self-isolations and hand hygiene.  She wanted to let MD know and wants her "to call and check on her in a few weeks". Christen Bame, CMA

## 2018-08-06 NOTE — Telephone Encounter (Signed)
Called in order to check on patient.  States that she is back at work.  She states she is wearing a mask every day.  She is no longer feeling ill and is back to her normal self.  She was very appreciative of the call to check in on her.  If she needs anything she will call the office back.

## 2018-08-23 ENCOUNTER — Other Ambulatory Visit: Payer: Self-pay | Admitting: Family Medicine

## 2018-09-14 ENCOUNTER — Encounter: Payer: Self-pay | Admitting: Orthopedic Surgery

## 2018-09-14 ENCOUNTER — Other Ambulatory Visit: Payer: Self-pay

## 2018-09-14 ENCOUNTER — Ambulatory Visit (INDEPENDENT_AMBULATORY_CARE_PROVIDER_SITE_OTHER): Payer: BLUE CROSS/BLUE SHIELD | Admitting: Physician Assistant

## 2018-09-14 VITALS — Ht 64.0 in | Wt 240.0 lb

## 2018-09-14 DIAGNOSIS — M1711 Unilateral primary osteoarthritis, right knee: Secondary | ICD-10-CM | POA: Diagnosis not present

## 2018-09-14 NOTE — Progress Notes (Signed)
Office Visit Note   Patient: Lisa Sullivan           Date of Birth: January 12, 1966           MRN: [id number] Visit Date: 09/14/2018              Requested by: Shirley, [place], [place],   [id number] PCP: Shirley, [place], DO  Chief Complaint  Patient presents with  . Right Knee - Pain, Edema      HPI: The patient is a 53 yo woman who is seen for follow up of her right knee osteoarthritis with known bone on bone degenerative changes over the medial joint. She reports the last steroid injection did help some and would like another injection today. We also reviewed the severity of her arthritic changes today and reviewed her x ray findings from her previous radiographs.   Assessment & Plan: Visit Diagnoses:  1. Unilateral primary osteoarthritis, right knee     Plan: After informed consent, the patient underwent injection of the right knee with a combination of lidocaine and Depo medrol under sterile techniques and tolerated this well. She will follow up in several weeks. She is considering a knee replacement but is concerned about the recovery, time away from work, etc.   Follow-Up Instructions: Return in about 4 weeks (around 10/12/2018).   Ortho Exam  Patient is alert, oriented, no adenopathy, well-dressed, normal affect, normal respiratory effort. Right knee has a small effusion. She has pain and crepitus with range of motion. Range of motion is 0-105 degrees. No instability of collateral ligaments. Negative drawer. Neurovascularly intact distally. The patient underwent steroid injection under sterile techniques and tolerated the procedure well.   Imaging: No results found. No images are attached to the encounter.  Labs: Lab Results  Component Value Date   HGBA1C 6.2 10/28/2017   HGBA1C 6.4 09/02/2016   HGBA1C 6.3 06/18/2016   REPTSTATUS 02/26/2007 FINAL 02/24/2007   GRAMSTAIN  02/24/2007    NO WBC SEEN RARE SQUAMOUS EPITHELIAL CELLS PRESENT  FEW GRAM POSITIVE COCCI IN CLUSTERS IN PAIRS   CULT  02/24/2007    MODERATE METHICILLIN RESISTANT STAPHYLOCOCCUS AUREUS Note: RIFAMPIN AND GENTAMICIN SHOULD NOT BE USED AS SINGLE DRUGS FOR TREATMENT OF STAPH INFECTIONS. Contact laboratory if Clindamycin results needed. [phone] CRITICAL RESULT CALLED TO, READ BACK BY AND VERIFIED WITH: TONY SILVIANO 02/26/07 1020 BY  SMITHERSJ   LABORGA METHICILLIN RESISTANT STAPHYLOCOCCUS AUREUS 02/24/2007     Lab Results  Component Value Date   ALBUMIN 4.2 09/02/2016   ALBUMIN 4.1 08/02/2013   ALBUMIN 3.9 08/03/2012    Body mass index is 41.2 kg/m.  Orders:  Orders Placed This Encounter  Procedures  . Large Joint Inj: R knee   No orders of the defined types were placed in this encounter.    Procedures: Large Joint Inj: R knee on 09/14/2018 4:33 PM Indications: pain and diagnostic evaluation Details: 22 G 1.5 in needle, anteromedial approach  Arthrogram: No  Medications: 5 mL lidocaine 1 %; 40 mg methylPREDNISolone acetate 40 MG/ML Outcome: tolerated well, no immediate complications Procedure, treatment alternatives, risks and benefits explained, specific risks discussed. Consent was given by the patient. Immediately prior to procedure a time out was called to verify the correct patient, procedure, equipment, support staff and site/side marked as required. Patient was prepped and draped in the usual sterile fashion.      Clinical Data: No additional findings.  ROS:  All other systems negative, except  as noted in the HPI. Review of Systems  Objective: Vital Signs: Ht 5\' 4"  (1.626 m)   Wt 240 lb (108.9 kg)   BMI 41.20 kg/m   Specialty Comments:  No specialty comments available.  PMFS History: Patient Active Problem List   Diagnosis Date Noted  . Acute pain of left shoulder 05/29/2018  . Right knee pain 08/16/2016  . Prediabetes 06/18/2016  . Left knee pain 04/26/2015  . Depression 01/23/2015  . Headache 12/15/2014   . Healthcare maintenance 12/15/2014  . Hypertension 02/28/2011  . Goiter 02/28/2011  . OBESITY, UNSPECIFIED 03/27/2010  . SMOKER 03/27/2010  . 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 Gall stone   . Hypertension     Family History  Problem Relation Age of Onset  . Diabetes Mother   . Stroke Father   . Breast cancer Paternal Grandmother   . Colon cancer Neg Hx   . Esophageal cancer Neg Hx   . Stomach cancer Neg Hx     Past Surgical History:  Procedure Laterality Date  . CESAREAN SECTION    . TUBAL LIGATION     Social History   Occupational History  . Not on file  Tobacco Use  . Smoking status: Current Some Day Smoker    Packs/day: 0.50  . Smokeless tobacco: Never Used  Substance and Sexual Activity  . Alcohol use: Yes    Comment: socially   . Drug use: No  . Sexual activity: Not on file

## 2018-09-15 ENCOUNTER — Encounter: Payer: Self-pay | Admitting: Physician Assistant

## 2018-09-15 MED ORDER — METHYLPREDNISOLONE ACETATE 40 MG/ML IJ SUSP
40.0000 mg | INTRAMUSCULAR | Status: AC | PRN
  Administered 2018-09-14: 40 mg via INTRA_ARTICULAR

## 2018-09-15 MED ORDER — LIDOCAINE HCL 1 % IJ SOLN
5.0000 mL | INTRAMUSCULAR | Status: AC | PRN
  Administered 2018-09-14: 5 mL

## 2018-10-07 ENCOUNTER — Other Ambulatory Visit: Payer: Self-pay

## 2018-10-07 ENCOUNTER — Ambulatory Visit
Admission: RE | Admit: 2018-10-07 | Discharge: 2018-10-07 | Disposition: A | Discharge status: Home / Self Care | Payer: BLUE CROSS/BLUE SHIELD | Source: Ambulatory Visit | Attending: Family Medicine | Admitting: Family Medicine

## 2018-10-07 ENCOUNTER — Encounter: Payer: Self-pay | Admitting: Family Medicine

## 2018-10-07 ENCOUNTER — Telehealth: Payer: Self-pay

## 2018-10-07 ENCOUNTER — Ambulatory Visit (INDEPENDENT_AMBULATORY_CARE_PROVIDER_SITE_OTHER): Payer: BLUE CROSS/BLUE SHIELD | Admitting: Family Medicine

## 2018-10-07 VITALS — BP 114/80 | HR 94 | Ht 64.0 in | Wt 236.5 lb

## 2018-10-07 DIAGNOSIS — R109 Unspecified abdominal pain: Secondary | ICD-10-CM

## 2018-10-07 DIAGNOSIS — R7303 Prediabetes: Secondary | ICD-10-CM

## 2018-10-07 DIAGNOSIS — E119 Type 2 diabetes mellitus without complications: Secondary | ICD-10-CM

## 2018-10-07 LAB — POCT GLYCOSYLATED HEMOGLOBIN (HGB A1C): Hemoglobin A1C: 6.8 % — AB (ref 4.0–5.6)

## 2018-10-07 LAB — POCT URINALYSIS DIP (MANUAL ENTRY)
Bilirubin, UA: NEGATIVE
Blood, UA: NEGATIVE
Glucose, UA: NEGATIVE mg/dL
Ketones, POC UA: NEGATIVE mg/dL
Leukocytes, UA: NEGATIVE
Nitrite, UA: NEGATIVE
Protein Ur, POC: NEGATIVE mg/dL
Spec Grav, UA: 1.025 (ref 1.010–1.025)
Urobilinogen, UA: 0.2 E.U./dL
pH, UA: 5 (ref 5.0–8.0)

## 2018-10-07 MED ORDER — DICLOFENAC SODIUM 1 % TD GEL: 4.0000 g | Freq: Four times a day (QID) | TRANSDERMAL | 1 refills | Status: AC

## 2018-10-07 MED ORDER — METFORMIN HCL ER 500 MG PO TB24: 1000.0000 mg | ORAL_TABLET | Freq: Two times a day (BID) | ORAL | 3 refills | Status: DC

## 2018-10-07 NOTE — Progress Notes (Signed)
Subjective:  Patient ID: Lisa Sullivan  DOB: 05-01-1965 MRN: [id number]  Lisa Sullivan is a 53 y.o. female with a PMH of HTN, h/o tobacco use, depression, OA of knee, prediabetes, here today for L sided flank/side pain.   HPI:  L sided flank/side pain: -Patient reports that she woke up with back and side pain on the left since Friday.  Patient reports that it worsened on Sunday night.  She states that she has been out of work since Monday.  She has tried Flexeril, Tylenol and Advil which has not helped with her pain. -She states that she has not had anything like this happen before.  She denies any known trauma.  She states that she simply woke up with the pain. -She describes the pain as a sharp stabbing pain -She denies any rashes in the area.  Reports that she has already had her shingles vaccine. -She denies any fevers, chills, diarrhea, nausea, vomiting, dysuria, hematuria -Patient reports that the pain is constant.  She reports that she cannot lay on that side and has to lay on her back.  Diabetes Medications: metformin 500mg  daily Compliance (Modifying factor) - yes Hypoglycemic symptoms- no Will discuss starting Aspirin, and on statin Last foot exam: will perform at next visit ROS: denies dizziness, diaphoresis, LOC, polyuria, polydipsia  Monitoring Labs and Parameters Last A1C: 6.7    ROS: as mentioned in HPI  Family hx: multiple family members with T2DM and HTN  Social hx: Denies use of illicit drugs, alcohol use Smoking status reviewed  Patient Active Problem List   Diagnosis Date Noted  . Left flank pain 10/13/2018  . Right knee pain 08/16/2016  . Type 2 diabetes mellitus without complications ([place]) 46/65/9935  . Depression 01/23/2015  . Headache 12/15/2014  . Hypertension 02/28/2011  . Goiter 02/28/2011  . OBESITY, UNSPECIFIED 03/27/2010  . SMOKER 03/27/2010  . DYSHIDROTIC ECZEMA, HANDS 03/27/2010     Objective:  BP 114/80   Pulse 94   Ht 5\' 4"   (1.626 m)   Wt 236 lb 8 oz (107.3 kg)   SpO2 96%   BMI 40.60 kg/m   Vitals and nursing note reviewed  General: NAD, pleasant Cardiac: RRR, normal heart sounds, no m/r/g Pulm: normal effort, CTAB, no wheezing, crackle noted GI: soft, nontender, nondistended R Side: patient with tenderness over lower ribs, no rash present, no surrounding erythema, bruising Extremities: no edema or cyanosis. WWP. Skin: warm and dry, no rashes noted Neuro: alert and oriented, no focal deficits Psych: normal affect, normal thought content  Assessment & Plan:   Type 2 diabetes mellitus without complications ([place]) Patient now with A1c of 6.7. Will increase metformin to 1000 mg of metformin twice daily and again discussed lifestyle changes. Patient motivated given her family hx.   Left flank pain UA with no signs of infection or concern for kidney stone at this time. Patient aferbile and no rash present; has had shingles vaccine. Likely MSK given the tenderness on exam. Voltaren gel and salon pas patches for the pain.  - Obtain CXR to r/o lung causing this pain.  -Platient to return within 1 week if the pain has not lessened or improved or if you develop a rash of any kind in that area.   Given return precautions including: severely worsening pain, notice blood in your urine or have burning when you urinate, then please come to clinic or go to the emergency room if it is after hours.   [place] Jhostin Epps, DO  Family Medicine Resident PGY-3

## 2018-10-07 NOTE — Telephone Encounter (Signed)
Prior authorization requested for diclofenac gel from [facility]. PA submitted through cover my meds. Will wait for response.   Key: Lisa Sullivan

## 2018-10-07 NOTE — Patient Instructions (Addendum)
Thank you for coming to see me today. It was a pleasure! Today we talked about:   For your back/side pain.  Your urine does not show any signs of infection or concern for kidney stone at this time.  We will obtain imaging to ensure that there is not something in your lungs causing this pain.  It could be muscle pain given the tenderness.  I recommend using a Voltaren gel over the area where it hurts, you may also use salon pas patches.   Please return within 1 week if the pain has not lessened or improved or if you develop a rash of any kind in that area.  If you have severely worsening pain, notice blood in your urine or have burning when you urinate, then please come to clinic or go to the emergency room if it is after hours.  For your new diagnosis of diabetes, please try to limit things that are high in sugar content such as lemonade and sweet tea.  As we discussed you could try Crystal light lemonade and unsweetened tea or sweetened with artificial sugars in order to help with your transition.  I have attached other diet recommendations below.  Please start taking 1000 mg of metformin twice daily rather than your 500 mg twice daily.  Please follow-up with me in 3 months or sooner as needed.  If you have any questions or concerns, please do not hesitate to call the office at [phone].  Take Care,   [place] Stephany Poorman, DO   Diet Recommendations for Diabetes  Carbohydrate includes starch, sugar, and fiber.  Of these, only sugar and starch raise blood glucose.  (Fiber is found in fruits, vegetables [especially skin, seeds, and stalks] and whole grains.)   Starchy (carb) foods: Bread, rice, pasta, potatoes, corn, cereal, grits, crackers, bagels, muffins, all baked goods.  (Fruit, milk, and yogurt also have carbohydrate, but most of these foods will not spike your blood sugar as most starchy foods will.)  A few fruits do cause high blood sugars; use small portions of bananas (limit to 1/2 at a  time), grapes, watermelon, oranges, and most tropical fruits.   Protein foods: Meat, fish, poultry, eggs, dairy foods, and beans such as pinto and kidney beans (beans also provide carbohydrate).   1. Eat at least REAL 3 meals and 1-2 snacks per day. Never go more than 4-5 hours while awake without eating. Eat breakfast within the first hour of getting up.   2. Limit starchy foods to TWO per meal and ONE per snack. ONE portion of a starchy  food is equal to the following:   - ONE slice of bread (or its equivalent, such as half of a hamburger bun).   - 1/2 cup of a "scoopable" starchy food such as potatoes or rice.   - 15 grams of Total Carbohydrate as shown on food label.  3. Include at every meal: a protein food, a carb food, and vegetables and/or fruit.   - Obtain twice the volume of veg's as protein or carbohydrate foods for both lunch and dinner.   - Fresh or frozen veg's are best.   - Keep frozen veg's on hand for a quick vegetable serving.

## 2018-10-08 ENCOUNTER — Emergency Department (HOSPITAL_COMMUNITY)
Admission: EM | Admit: 2018-10-08 | Discharge: 2018-10-08 | Disposition: A | Discharge status: Home / Self Care | Payer: BLUE CROSS/BLUE SHIELD | Attending: Emergency Medicine | Admitting: Emergency Medicine

## 2018-10-08 ENCOUNTER — Other Ambulatory Visit: Payer: Self-pay

## 2018-10-08 ENCOUNTER — Encounter (HOSPITAL_COMMUNITY): Payer: Self-pay

## 2018-10-08 DIAGNOSIS — F17210 Nicotine dependence, cigarettes, uncomplicated: Secondary | ICD-10-CM | POA: Insufficient documentation

## 2018-10-08 DIAGNOSIS — I10 Essential (primary) hypertension: Secondary | ICD-10-CM | POA: Diagnosis not present

## 2018-10-08 DIAGNOSIS — M545 Low back pain, unspecified: Secondary | ICD-10-CM

## 2018-10-08 DIAGNOSIS — Z79899 Other long term (current) drug therapy: Secondary | ICD-10-CM | POA: Diagnosis not present

## 2018-10-08 MED ORDER — PREDNISONE 50 MG PO TABS: ORAL_TABLET | ORAL | 0 refills | Status: DC

## 2018-10-08 MED ORDER — DIAZEPAM 5 MG PO TABS
5.0000 mg | ORAL_TABLET | Freq: Once | ORAL | Status: AC
  Administered 2018-10-08: 5 mg via ORAL
  Filled 2018-10-08: qty 1

## 2018-10-08 MED ORDER — HYDROCODONE-ACETAMINOPHEN 5-325 MG PO TABS: 2.0000 | ORAL_TABLET | ORAL | 0 refills | Status: DC | PRN

## 2018-10-08 MED ORDER — DIAZEPAM 2 MG PO TABS: 2.0000 mg | ORAL_TABLET | Freq: Four times a day (QID) | ORAL | 0 refills | Status: DC | PRN

## 2018-10-08 MED ORDER — MORPHINE SULFATE (PF) 4 MG/ML IV SOLN
6.0000 mg | Freq: Once | INTRAVENOUS | Status: AC
  Administered 2018-10-08: 6 mg via INTRAMUSCULAR
  Filled 2018-10-08: qty 2

## 2018-10-08 MED ORDER — PREDNISONE 20 MG PO TABS
60.0000 mg | ORAL_TABLET | Freq: Once | ORAL | Status: AC
  Administered 2018-10-08: 60 mg via ORAL
  Filled 2018-10-08: qty 3

## 2018-10-08 NOTE — ED Provider Notes (Signed)
[facility] DEPT Provider Note   CSN: [id number] Arrival date & time: 10/08/18  1614     History   Chief Complaint Chief Complaint  Patient presents with  . Back Pain    HPI Lisa Sullivan is a 53 y.o. female.     53 year old female presents with left-sided back pain times several days.  Patient states that she awoke 3 days ago and had severe pain and started in her left mid back and went down to her left buttock.  Denied any history of trauma.  No bowel or bladder dysfunction.  Denies any foot drop when she walks.  No prior history of same.  Saw her primary care doctor and was placed on Flexeril as well as anti-inflammatory gel which did not help.  Denies any urinary symptoms.     Past Medical History:  Diagnosis Date  . Gall stone   . Hypertension     Patient Active Problem List   Diagnosis Date Noted  . Acute pain of left shoulder 05/29/2018  . Right knee pain 08/16/2016  . Prediabetes 06/18/2016  . Left knee pain 04/26/2015  . Depression 01/23/2015  . Headache 12/15/2014  . Healthcare maintenance 12/15/2014  . Hypertension 02/28/2011  . Goiter 02/28/2011  . OBESITY, UNSPECIFIED 03/27/2010  . SMOKER 03/27/2010  . 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 CESAREAN SECTION    . TUBAL LIGATION       OB History   No obstetric history on file.      Home Medications    Prior to Admission medications   Medication Sig Start Date End Date Taking? Authorizing Provider  albuterol (PROVENTIL HFA;VENTOLIN HFA) 108 (90 Base) MCG/ACT inhaler Inhale 2 puffs into the lungs every 6 (six) hours as needed for wheezing or shortness of breath. 07/30/17   Diallo, Earna Coder, MD  cetirizine (ZYRTEC) 10 MG tablet TAKE 1 TABLET(10 MG) BY MOUTH DAILY 08/24/18   Shirley, [place], DO  diclofenac sodium (VOLTAREN) 1 % GEL Apply 4 g topically 4 (four) times daily. 10/07/18   Shirley, [place], DO   fluticasone (FLONASE) 50 MCG/ACT nasal spray Place 2 sprays into both nostrils daily. 03/09/18   Leeanne Rio, MD  hydrochlorothiazide (HYDRODIURIL) 25 MG tablet Take 1 tablet (25 mg total) by mouth daily. 01/22/18   Shirley, [place], DO  HYDROcodone-acetaminophen (NORCO/VICODIN) 5-325 MG tablet Take 1 tablet by mouth every 6 (six) hours as needed for moderate pain or severe pain. 05/26/18   Charlesetta Shanks, MD  metFORMIN (GLUCOPHAGE-XR) 500 MG 24 hr tablet Take 2 tablets (1,000 mg total) by mouth 2 (two) times a day. 10/07/18   Shirley, [place], DO  methocarbamol (ROBAXIN) 500 MG tablet Take 2 tablets (1,000 mg total) by mouth every 8 (eight) hours as needed for muscle spasms. 05/26/18   Charlesetta Shanks, MD  naproxen (NAPROSYN) 375 MG tablet Take 1 tablet (375 mg total) by mouth 2 (two) times daily. 05/26/18   Charlesetta Shanks, MD  predniSONE (DELTASONE) 50 MG tablet Take 1 tablet daily by mouth 05/29/18   Steve Rattler, DO  ranitidine (ZANTAC) 150 MG capsule TAKE ONE CAPSULE BY MOUTH TWICE DAILY 05/26/18   Shirley, [place], DO    Family History Family History  Problem Relation Age of Onset  . Diabetes Mother   . Stroke Father   . Breast cancer Paternal Grandmother   . Colon cancer Neg Hx   . Esophageal cancer Neg Hx   .  Stomach cancer Neg Hx     Social History Social History   Tobacco Use  . Smoking status: Current Some Day Smoker    Packs/day: 0.50    Types: Cigarettes  . Smokeless tobacco: Never Used  Substance Use Topics  . Alcohol use: Yes    Comment: socially   . Drug use: No     Allergies   Bee venom, Latex, and Sulfa antibiotics   Review of Systems Review of Systems  All other systems reviewed and are negative.    Physical Exam Updated Vital Signs BP (!) 140/103 (BP Location: Left Arm)   Pulse 91   Temp 98.2 F (36.8 C)   Resp 14   Ht 1.626 m (5\' 4" )   Wt 107 kg   SpO2 98%   BMI 40.51 kg/m   Physical Exam Vitals signs and nursing note reviewed.   Constitutional:      General: She is not in acute distress.    Appearance: Normal appearance. She is well-developed. She is not toxic-appearing.  HENT:     Head: Normocephalic and atraumatic.  Eyes:     General: Lids are normal.     Conjunctiva/sclera: Conjunctivae normal.     Pupils: Pupils are equal, round, and reactive to light.  Neck:     Musculoskeletal: Normal range of motion and neck supple.     Thyroid: No thyroid mass.     Trachea: No tracheal deviation.  Cardiovascular:     Rate and Rhythm: Normal rate and regular rhythm.     Heart sounds: Normal heart sounds. No murmur. No gallop.   Pulmonary:     Effort: Pulmonary effort is normal. No respiratory distress.     Breath sounds: Normal breath sounds. No stridor. No decreased breath sounds, wheezing, rhonchi or rales.  Abdominal:     General: Bowel sounds are normal. There is no distension.     Palpations: Abdomen is soft.     Tenderness: There is no abdominal tenderness. There is no rebound.  Musculoskeletal: Normal range of motion.        General: No tenderness.       Back:  Skin:    General: Skin is warm and dry.     Findings: No abrasion or rash.  Neurological:     Mental Status: She is alert and oriented to person, place, and time.     GCS: GCS eye subscore is 4. GCS verbal subscore is 5. GCS motor subscore is 6.     Cranial Nerves: No cranial nerve deficit.     Sensory: No sensory deficit.     Comments: Strength is 5 of 5 in all 4 extremities.  Psychiatric:        Speech: Speech normal.        Behavior: Behavior normal.      ED Treatments / Results  Labs (all labs ordered are listed, but only abnormal results are displayed) Labs Reviewed - No data to display  EKG None  Radiology Dg Chest 2 View  Result Date: 10/07/2018 CLINICAL DATA:  Left lateral chest pain for the past 6 days. Hypertension. EXAM: CHEST - 2 VIEW COMPARISON:  05/26/2018. FINDINGS: Normal sized heart. Clear lungs with normal  vascularity. Thoracic spine degenerative changes. IMPRESSION: No acute abnormality. Electronically Signed   By: Claudie Revering M.D.   On: 10/07/2018 17:06    Procedures Procedures (including critical care time)  Medications Ordered in ED Medications  diazepam (VALIUM) tablet 5 mg (has no administration  in time range)  predniSONE (DELTASONE) tablet 60 mg (has no administration in time range)  morphine 4 MG/ML injection 6 mg (has no administration in time range)     Initial Impression / Assessment and Plan / ED Course  I have reviewed the triage vital signs and the nursing notes.  Pertinent labs & imaging results that were available during my care of the patient were reviewed by me and considered in my medical decision making (see chart for details).       Red flags for cauda equina. Patient medicated for pain here and feels better.  Will prescribe short course of steroids as well as prescription for analgesics and muscle relaxants.  Final Clinical Impressions(s) / ED Diagnoses   Final diagnoses:  None    ED Discharge Orders    None       Lacretia Leigh, MD 10/08/18 1744

## 2018-10-08 NOTE — ED Triage Notes (Signed)
Patient c/o left lower back pain x 6 days. Patient states she went to her PCP and was prescribed Flexeril and had x-rays. Patient states the Flexeril is not helping. Patient denies any radiation of pain.

## 2018-10-08 NOTE — Telephone Encounter (Signed)
Response will come via fax.  Will await answer from MD.  Christen Bame, CMA

## 2018-10-08 NOTE — Telephone Encounter (Signed)
Patient states that she has already picked up the Voltaren gel.  Please let me know if there is anything further that I should do, however.

## 2018-10-13 DIAGNOSIS — R109 Unspecified abdominal pain: Secondary | ICD-10-CM | POA: Insufficient documentation

## 2018-10-13 NOTE — Assessment & Plan Note (Addendum)
Patient now with A1c of 6.7. Will increase metformin to 1000 mg of metformin twice daily and again discussed lifestyle changes. Patient motivated given her family hx.

## 2018-10-13 NOTE — Assessment & Plan Note (Signed)
UA with no signs of infection or concern for kidney stone at this time. Patient aferbile and no rash present; has had shingles vaccine. Likely MSK given the tenderness on exam. Voltaren gel and salon pas patches for the pain.  - Obtain CXR to r/o lung causing this pain.  -Platient to return within 1 week if the pain has not lessened or improved or if you develop a rash of any kind in that area.   Given return precautions including: severely worsening pain, notice blood in your urine or have burning when you urinate, then please come to clinic or go to the emergency room if it is after hours.

## 2018-10-26 ENCOUNTER — Encounter: Payer: Self-pay | Admitting: Internal Medicine

## 2018-11-24 ENCOUNTER — Other Ambulatory Visit: Payer: Self-pay | Admitting: Family Medicine

## 2019-01-10 ENCOUNTER — Other Ambulatory Visit: Payer: Self-pay | Admitting: Family Medicine

## 2019-01-15 ENCOUNTER — Telehealth (INDEPENDENT_AMBULATORY_CARE_PROVIDER_SITE_OTHER): Payer: BLUE CROSS/BLUE SHIELD | Admitting: Student in an Organized Health Care Education/Training Program

## 2019-01-15 ENCOUNTER — Other Ambulatory Visit: Payer: Self-pay

## 2019-01-15 DIAGNOSIS — J069 Acute upper respiratory infection, unspecified: Secondary | ICD-10-CM | POA: Diagnosis not present

## 2019-01-15 MED ORDER — BENZONATATE 100 MG PO CAPS: 100.0000 mg | ORAL_CAPSULE | Freq: Two times a day (BID) | ORAL | 0 refills | Status: DC | PRN

## 2019-01-15 NOTE — Assessment & Plan Note (Signed)
Likely viral URI. Patient stable and quarantining at home.  She is already improving and taking several OTC medications. Today is [birth year] birthday. We discussed more options of treatment and decided to try honey and tessalon perles. Asked patient to avoid taking leftover antibiotics.  Recommended she call back if cough is getting worse or if she develops a fever.

## 2019-01-15 NOTE — Progress Notes (Signed)
[place] Telemedicine Visit  Patient consented to have virtual visit. Method of visit: Telephone  Encounter participants: Patient: Lisa Sullivan - located at home Provider: Richarda Osmond - located at [facility]  Chief Complaint: cough  HPI: Feeling much  Cough began on Saturday which is productive of white sputum, non-bloody. Been able to eat and drink okay. Denies sick contacts. Works at a daycare. Stayed home from work since Tuesday. Taken OTC robitussin and allergy pills, mucinex and throat pump twice per day, flonase, lozenges, vitamin C. They have been helping her get better sleep at night but cough lingers on.  Denies fever, vomiting, loss of taste or smell, SOB, rhinorrhea, sore throat.  Endorses mild nausea, muscle aches, tickle in throat.  Pertinent PMHx: allergies, asthma  Exam:  Respiratory: raspy voice, no cough during length of encounter.   Assessment/Plan:  Viral URI with cough Likely viral URI. Patient stable and quarantining at home.  She is already improving and taking several OTC medications. Today is her birthday. We discussed more options of treatment and decided to try honey and tessalon perles. Asked patient to avoid taking leftover antibiotics.  Recommended she call back if cough is getting worse or if she develops a fever.   Time spent during visit with patient: 13 minutes

## 2019-01-25 ENCOUNTER — Encounter: Payer: BLUE CROSS/BLUE SHIELD | Admitting: Internal Medicine

## 2019-02-06 ENCOUNTER — Other Ambulatory Visit: Payer: Self-pay | Admitting: Family Medicine

## 2019-03-03 ENCOUNTER — Ambulatory Visit (INDEPENDENT_AMBULATORY_CARE_PROVIDER_SITE_OTHER): Payer: BLUE CROSS/BLUE SHIELD | Admitting: Family Medicine

## 2019-03-03 ENCOUNTER — Other Ambulatory Visit: Payer: Self-pay

## 2019-03-03 ENCOUNTER — Encounter: Payer: Self-pay | Admitting: Family Medicine

## 2019-03-03 VITALS — BP 128/70 | HR 74 | Wt 236.0 lb

## 2019-03-03 DIAGNOSIS — E119 Type 2 diabetes mellitus without complications: Secondary | ICD-10-CM | POA: Diagnosis not present

## 2019-03-03 DIAGNOSIS — R0602 Shortness of breath: Secondary | ICD-10-CM

## 2019-03-03 DIAGNOSIS — I10 Essential (primary) hypertension: Secondary | ICD-10-CM

## 2019-03-03 LAB — POCT GLYCOSYLATED HEMOGLOBIN (HGB A1C): HbA1c, POC (controlled diabetic range): 6.8 % (ref 0.0–7.0)

## 2019-03-03 LAB — POCT UA - MICROALBUMIN
Albumin/Creatinine Ratio, Urine, POC: <30
Creatinine, POC: 100 mg/dL
Microalbumin Ur, POC: 10 mg/L

## 2019-03-03 MED ORDER — PANTOPRAZOLE SODIUM 40 MG PO TBEC: 40.0000 mg | DELAYED_RELEASE_TABLET | Freq: Every day | ORAL | 3 refills | Status: DC

## 2019-03-03 MED ORDER — CETIRIZINE HCL 10 MG PO TABS: ORAL_TABLET | ORAL | 3 refills | Status: DC

## 2019-03-03 MED ORDER — HYDROCHLOROTHIAZIDE 25 MG PO TABS: 25.0000 mg | ORAL_TABLET | Freq: Every day | ORAL | 3 refills | Status: DC

## 2019-03-03 MED ORDER — FLUTICASONE PROPIONATE 50 MCG/ACT NA SUSP: NASAL | 3 refills | Status: DC

## 2019-03-03 NOTE — Patient Instructions (Signed)
Thank you for coming to see me today. It was a pleasure! Today we talked about:   Please continue your current your medications. Congratulations on planning your wedding planning! I'll call you with your labs.   Please follow-up with me in 3 months or sooner as needed.  If you have any questions or concerns, please do not hesitate to call the office at [phone].  Take Care,   [place] Jaylyne Breese, DO

## 2019-03-03 NOTE — Progress Notes (Addendum)
  Subjective:  Patient ID: Lisa Sullivan  DOB: 02-12-1966 MRN: [id number]  ANDILYNN GUARDIOLA is a 53 y.o. female with a PMH of HTN, h/o tobacco use, depression, OA of knee, diabetes, here today for f/u diabetes.   HPI:  Diabetes: - Medications: metformin 1000mg  twice daily, Compliant: yes  - On Aspirin, and on statin - Last eye exam: placed referral today - Last foot exam: up to date ROS: denies hypoglycemic sx, dizziness, diaphoresis, LOC, polyuria, polydipsia  Monitoring Labs and Parameters - Last A1C:  Lab Results  Component Value Date   HGBA1C 6.8 03/03/2019   Hypertension: - Medications: HCTZ 25mg   - Compliance: yes - Denies any SOB, CP, vision changes, LE edema, medication SEs, or symptoms of hypotension  ROS: As mentioned in HPI  Family hx: Cardiovascular disease, Diabetes   Social hx: Denies use of illicit drugs, alcohol use; excited to be getting married in May Smoking status reviewed  Patient Active Problem List   Diagnosis Date Noted  . Type 2 diabetes mellitus without complications ([place]) [id number]  . Depression 01/23/2015  . Hypertension 02/28/2011  . Goiter 02/28/2011  . OBESITY, UNSPECIFIED 03/27/2010  . SMOKER 03/27/2010  . DYSHIDROTIC ECZEMA, HANDS 03/27/2010     Objective:  BP 128/70   Pulse 74   Wt 236 lb (107 kg)   SpO2 98%   BMI 40.51 kg/m   Vitals and nursing note reviewed  General: NAD, pleasant Cardiac: RRR, normal heart sounds, no m/r/g Pulm: normal effort, CTAB Extremities: no edema or cyanosis. WWP. Skin: warm and dry, no rashes noted Neuro: alert and oriented, no focal deficits Psych: normal affect, normal thought content  Assessment & Plan:   Type 2 diabetes mellitus without complications ([place]) Well controlled. A1c 6.8 on metformin. Continue current medication. Will call to discuss starting statin therapy for patient given recent lipid panel. Referral placed for eye exam. Urine microalbumin negative.   Hypertension BP at  goal. Continue current management with HCTZ 25mg  daily. BMP wnl.    [place] Amar Keenum, DO Family Medicine Resident PGY-3

## 2019-03-04 LAB — CBC
Hematocrit: 42.8 % (ref 34.0–46.6)
Hemoglobin: 13.9 g/dL (ref 11.1–15.9)
MCH: 27.9 pg (ref 26.6–33.0)
MCHC: 32.5 g/dL (ref 31.5–35.7)
MCV: 86 fL (ref 79–97)
Platelets: 214 10*3/uL (ref 150–450)
RBC: 4.99 x10E6/uL (ref 3.77–5.28)
RDW: 14.5 % (ref 11.7–15.4)
WBC: 6.6 10*3/uL (ref 3.4–10.8)

## 2019-03-04 LAB — BASIC METABOLIC PANEL
BUN/Creatinine Ratio: 16 (ref 9–23)
BUN: 15 mg/dL (ref 6–24)
CO2: 26 mmol/L (ref 20–29)
Calcium: 9.6 mg/dL (ref 8.7–10.2)
Chloride: 101 mmol/L (ref 96–106)
Creatinine, Ser: 0.95 mg/dL (ref 0.57–1.00)
GFR calc Af Amer: 78 mL/min/{1.73_m2} (ref >59)
GFR calc non Af Amer: 68 mL/min/{1.73_m2} (ref >59)
Glucose: 138 mg/dL — ABNORMAL HIGH (ref 65–99)
Potassium: 3.8 mmol/L (ref 3.5–5.2)
Sodium: 142 mmol/L (ref 134–144)

## 2019-03-04 LAB — LIPID PANEL
Chol/HDL Ratio: 4.6 ratio — ABNORMAL HIGH (ref 0.0–4.4)
Cholesterol, Total: 252 mg/dL — ABNORMAL HIGH (ref 100–199)
HDL: 55 mg/dL (ref >39)
LDL Chol Calc (NIH): 151 mg/dL — ABNORMAL HIGH (ref 0–99)
Triglycerides: 253 mg/dL — ABNORMAL HIGH (ref 0–149)
VLDL Cholesterol Cal: 46 mg/dL — ABNORMAL HIGH (ref 5–40)

## 2019-03-08 NOTE — Assessment & Plan Note (Addendum)
BP at goal. Continue current management with HCTZ 25mg  daily. BMP wnl.

## 2019-03-08 NOTE — Assessment & Plan Note (Addendum)
Well controlled. A1c 6.8 on metformin. Continue current medication. Will call to discuss starting statin therapy for patient given recent lipid panel. Referral placed for eye exam. Urine microalbumin negative.

## 2019-03-12 ENCOUNTER — Other Ambulatory Visit: Payer: Self-pay | Admitting: Family Medicine

## 2019-03-12 MED ORDER — ATORVASTATIN CALCIUM 20 MG PO TABS: 20.0000 mg | ORAL_TABLET | Freq: Every day | ORAL | 1 refills | Status: DC

## 2019-05-05 ENCOUNTER — Other Ambulatory Visit: Payer: Self-pay

## 2019-05-05 ENCOUNTER — Encounter: Payer: Self-pay | Admitting: Family Medicine

## 2019-05-05 ENCOUNTER — Ambulatory Visit (INDEPENDENT_AMBULATORY_CARE_PROVIDER_SITE_OTHER): Payer: BLUE CROSS/BLUE SHIELD | Admitting: Family Medicine

## 2019-05-05 DIAGNOSIS — L282 Other prurigo: Secondary | ICD-10-CM | POA: Diagnosis not present

## 2019-05-05 MED ORDER — PREDNISONE 20 MG PO TABS: 40.0000 mg | ORAL_TABLET | Freq: Every day | ORAL | 0 refills | Status: DC

## 2019-05-05 NOTE — Patient Instructions (Signed)
Thank you for coming to see me today. It was a pleasure! Today we talked about:   I will send in prednisone to your pharmacy. Please continue taking benadryl prior to nighttime. Please continue your cetirizine. DO NOT hesitate to go tot he ED if you experience any trouble breathing!  Please follow-up as needed.  If you have any questions or concerns, please do not hesitate to call the office at [phone].  Take Care,   [place] Ramy Greth, DO

## 2019-05-05 NOTE — Progress Notes (Signed)
   CHIEF COMPLAINT / HPI:  Diffuse rash: Patient reports that since last Friday she developed a rash on her arm.  She states that it then began to spread throughout the rest of her body and since yesterday has then spread to her face.  She states that her face is red and itchy.  She reports that she is itchy all over.  She has tried Benadryl and lotion for the itching but has not had any relief.  She states that her throat feels scratchy but she is not having any trouble swallowing or breathing.  She reports she has not had any new detergents, shampoos, lotions.  She denies any different foods being introduced into her diet.  She does report that she has recently tried some omega blue pills as well as a B12 immune boost pills.  She reports that she stopped taking them last week after she developed a rash but it did not improve.  Leg cramps Patient reports that she had to stop taking the atorvastatin due to development of leg cramps.  Advised patient to stop the atorvastatin at this time and we will discuss at a later date when she does not have any acute findings such as the current rash.  PERTINENT  PMH / PSH: HTN, T2DM, obesity, tobacco use disorder, depression  OBJECTIVE: BP (!) 148/82   Pulse 82   Wt 240 lb (108.9 kg)   SpO2 99%   BMI 41.20 kg/m   General: NAD, pleasant Respiratory: normal work of breathing, able to talk in complete sentences without issue Skin: Diffuse erythematous rash with multiple wheals on trunk, arms and face with some swelling noted of cheeks bilaterally  ASSESSMENT / PLAN:  Pruritic rash Patient with diffuse pruritic rash that is concerning for allergic reaction however no known source identified today.  Patient with no new medications.  Patient not on an ACE or an ARB.  She has stopped any new supplements that she was taking over-the-counter.  She is unable to identify any certain food source.  Given extent of rash involving face will trial steroid burst of 40  mg for 5 days.  Patient counseled that this may increase her sugars given her history of T2DM however given that rash has worsened would like to go forward.  Patient to continue taking her daily Zyrtec as well as Benadryl at night in order to help with the itching.  She is to go to the emergency room if she experiences any trouble breathing and patient voiced understanding of this plan.  She is to call or return to the office or be seen at an urgent care if her rash worsens or does not improve with a steroid.    [place] Alasha Mcguinness, DO PGY-3, [facility]

## 2019-05-06 DIAGNOSIS — L282 Other prurigo: Secondary | ICD-10-CM | POA: Insufficient documentation

## 2019-05-06 NOTE — Assessment & Plan Note (Signed)
Patient with diffuse pruritic rash that is concerning for allergic reaction however no known source identified today.  Patient with no new medications.  Patient not on an ACE or an ARB.  She has stopped any new supplements that she was taking over-the-counter.  She is unable to identify any certain food source.  Given extent of rash involving face will trial steroid burst of 40 mg for 5 days.  Patient counseled that this may increase her sugars given her history of T2DM however given that rash has worsened would like to go forward.  Patient to continue taking her daily Zyrtec as well as Benadryl at night in order to help with the itching.  She is to go to the emergency room if she experiences any trouble breathing and patient voiced understanding of this plan.  She is to call or return to the office or be seen at an urgent care if her rash worsens or does not improve with a steroid.

## 2019-05-25 ENCOUNTER — Telehealth: Payer: Self-pay | Admitting: Family Medicine

## 2019-05-25 NOTE — Telephone Encounter (Signed)
Will forward to Dr. Enid Derry to advise.  Jackqulyn Mendel,CMA

## 2019-05-25 NOTE — Telephone Encounter (Signed)
Pt states face cleared up, but meds hasnt cleared up everything, she said doesn't seem like she was taking them long enough. Please call pt to advise.

## 2019-05-26 NOTE — Telephone Encounter (Signed)
Patient reports that she is having a rash on her hands that appears more like blisters.  She also states that she is having some swelling as well as itching on her hands.  She states that she thinks the prednisone may help as it helps with her other rash but given the distant sounds different, I believe that she needs to be evaluated in the office.  Patient voiced understanding.  Schedule with access to care with Dr. Maudie Mercury on 3/5 at 10:10 AM.  Patient voiced understanding of plan.

## 2019-05-26 NOTE — Telephone Encounter (Signed)
Patient calling back about this. Patient would like Dr. Enid Derry to call her back as soon as she can. Please advise.

## 2019-05-28 ENCOUNTER — Other Ambulatory Visit: Payer: Self-pay

## 2019-05-28 ENCOUNTER — Ambulatory Visit (INDEPENDENT_AMBULATORY_CARE_PROVIDER_SITE_OTHER): Payer: BLUE CROSS/BLUE SHIELD | Admitting: Family Medicine

## 2019-05-28 VITALS — BP 120/84 | HR 84 | Wt 237.6 lb

## 2019-05-28 DIAGNOSIS — E119 Type 2 diabetes mellitus without complications: Secondary | ICD-10-CM | POA: Diagnosis not present

## 2019-05-28 DIAGNOSIS — L301 Dyshidrosis [pompholyx]: Secondary | ICD-10-CM | POA: Diagnosis not present

## 2019-05-28 DIAGNOSIS — F17200 Nicotine dependence, unspecified, uncomplicated: Secondary | ICD-10-CM

## 2019-05-28 LAB — POCT GLYCOSYLATED HEMOGLOBIN (HGB A1C): HbA1c, POC (controlled diabetic range): 7.2 % — AB (ref 0.0–7.0)

## 2019-05-28 MED ORDER — TRIAMCINOLONE ACETONIDE 0.1 % EX CREA: 1.0000 "application " | TOPICAL_CREAM | Freq: Two times a day (BID) | CUTANEOUS | 0 refills | Status: DC | PRN

## 2019-05-28 MED ORDER — PREDNISONE 20 MG PO TABS: ORAL_TABLET | ORAL | 0 refills | Status: AC

## 2019-05-28 NOTE — Patient Instructions (Signed)
Dear Alveda Reasons,   It was good to see you! Thank you for taking your time to come in to be seen. Today, we discussed the following:    Dyshidrotic Eczema   Start steroid treatment as prescribed   Use the steroid cream only with severe itching that is keeping you from sleep  Return if you do not have any improvement in the first week   Be well,   Zettie Cooley, M.D   [facility] [phone]  *Sign up for MyChart for instant access to your health profile, labs, orders, upcoming appointments or to contact your provider with questions*  ===================================================================================

## 2019-05-28 NOTE — Progress Notes (Signed)
     SUBJECTIVE:   CHIEF COMPLAINT / HPI:  Blisters on hands  Started after finishing prednisone, around last week. Started as just small bumps on proximal hand then then spread more distally over a day. Clear drainage from lesions. Using eurcerin, cortisone cream, aveeno itch cream. This has happening before, though she does not remember resolution. Denies rash on soles of feet.  Denies fevers, chills, nausea vomiting, recent travel.  PERTINENT  PMH / PSH: Tobacco use, obesity, history of dyshidrotic eczema in 2012  OBJECTIVE:   BP 120/84   Pulse 84   Wt 237 lb 9.6 oz (107.8 kg)   SpO2 99%   BMI 40.78 kg/m   General: Well-appearing female, no acute distress Hands: Palmar surfaces appear thickened with scattered vesicles and pustules on palms and fingers.  Some have coalesced.  No areas of distinct erythema or tenderness.  Excoriations present.  No obvious lesions on dorsal aspects.    ASSESSMENT/PLAN:   DYSHIDROTIC ECZEMA, HANDS Patient presenting with what appears to be recurrence of dyshidrotic eczema.  She reports clear fluid-filled pustules and vesicles that are intensely pruritic.  She has been using lotions and low potency steroid creams at home without any improvement.  On chart review, appears that patient has been treated for this before and improved with oral prednisone burst.  Spoke with patient about possible treatment options.  The first being super potency steroid cream to be applied multiple times daily.  The other being an oral prednisone taper.  Patient agrees to latter.   -Prednisone taper as prescribed -Medium to high potency steroid cream sent to pharmacy to use only as needed for severe pruritus (use precautions provided) -Return to care if no improvement or worsening.  Type 2 diabetes mellitus without complications (HCC) Obtained A1c today, but did not discuss diabetes management.  A1c mildly elevated to 7.2, which could be associated with recent prednisone burst  or worsening disease.  Patient will also be getting another prednisone taper today.  We discussed steroids in the setting of diabetes and hyperglycemia.  Patient continues to take her Metformin every day.  Patient to return for diabetes follow-up when able.  SMOKER Patient counseled on smoking cessation as it is a risk factor for dyshidrotic eczema.  Patient not ready to quit at this time but does express understanding.     Wilber Oliphant, MD [place]

## 2019-05-28 NOTE — Assessment & Plan Note (Signed)
Patient counseled on smoking cessation as it is a risk factor for dyshidrotic eczema.  Patient not ready to quit at this time but does express understanding.

## 2019-05-28 NOTE — Assessment & Plan Note (Signed)
Patient presenting with what appears to be recurrence of dyshidrotic eczema.  She reports clear fluid-filled pustules and vesicles that are intensely pruritic.  She has been using lotions and low potency steroid creams at home without any improvement.  On chart review, appears that patient has been treated for this before and improved with oral prednisone burst.  Spoke with patient about possible treatment options.  The first being super potency steroid cream to be applied multiple times daily.  The other being an oral prednisone taper.  Patient agrees to latter.   -Prednisone taper as prescribed -Medium to high potency steroid cream sent to pharmacy to use only as needed for severe pruritus (use precautions provided) -Return to care if no improvement or worsening.

## 2019-05-28 NOTE — Assessment & Plan Note (Signed)
Obtained A1c today, but did not discuss diabetes management.  A1c mildly elevated to 7.2, which could be associated with recent prednisone burst or worsening disease.  Patient will also be getting another prednisone taper today.  We discussed steroids in the setting of diabetes and hyperglycemia.  Patient continues to take her Metformin every day.  Patient to return for diabetes follow-up when able.

## 2019-06-10 ENCOUNTER — Ambulatory Visit: Payer: BLUE CROSS/BLUE SHIELD

## 2019-06-21 ENCOUNTER — Other Ambulatory Visit: Payer: Self-pay

## 2019-06-21 ENCOUNTER — Ambulatory Visit (INDEPENDENT_AMBULATORY_CARE_PROVIDER_SITE_OTHER): Payer: BLUE CROSS/BLUE SHIELD | Admitting: Student in an Organized Health Care Education/Training Program

## 2019-06-21 ENCOUNTER — Emergency Department (HOSPITAL_COMMUNITY): Payer: BLUE CROSS/BLUE SHIELD

## 2019-06-21 ENCOUNTER — Encounter: Payer: Self-pay | Admitting: Student in an Organized Health Care Education/Training Program

## 2019-06-21 ENCOUNTER — Emergency Department (HOSPITAL_COMMUNITY)
Admission: EM | Admit: 2019-06-21 | Discharge: 2019-06-21 | Disposition: A | Discharge status: Home / Self Care | Payer: BLUE CROSS/BLUE SHIELD | Attending: Emergency Medicine | Admitting: Emergency Medicine

## 2019-06-21 ENCOUNTER — Encounter (HOSPITAL_COMMUNITY): Payer: Self-pay | Admitting: *Deleted

## 2019-06-21 VITALS — BP 106/80 | HR 83 | Ht 64.0 in | Wt 240.1 lb

## 2019-06-21 DIAGNOSIS — Z7984 Long term (current) use of oral hypoglycemic drugs: Secondary | ICD-10-CM | POA: Insufficient documentation

## 2019-06-21 DIAGNOSIS — E119 Type 2 diabetes mellitus without complications: Secondary | ICD-10-CM | POA: Diagnosis not present

## 2019-06-21 DIAGNOSIS — R109 Unspecified abdominal pain: Secondary | ICD-10-CM | POA: Diagnosis not present

## 2019-06-21 DIAGNOSIS — K8020 Calculus of gallbladder without cholecystitis without obstruction: Secondary | ICD-10-CM | POA: Diagnosis not present

## 2019-06-21 DIAGNOSIS — R1013 Epigastric pain: Secondary | ICD-10-CM | POA: Diagnosis present

## 2019-06-21 DIAGNOSIS — I10 Essential (primary) hypertension: Secondary | ICD-10-CM | POA: Insufficient documentation

## 2019-06-21 DIAGNOSIS — R10811 Right upper quadrant abdominal tenderness: Secondary | ICD-10-CM | POA: Diagnosis not present

## 2019-06-21 DIAGNOSIS — R110 Nausea: Secondary | ICD-10-CM | POA: Insufficient documentation

## 2019-06-21 DIAGNOSIS — Z79899 Other long term (current) drug therapy: Secondary | ICD-10-CM | POA: Diagnosis not present

## 2019-06-21 LAB — COMPREHENSIVE METABOLIC PANEL
ALT: 26 U/L (ref 0–44)
AST: 22 U/L (ref 15–41)
Albumin: 4.1 g/dL (ref 3.5–5.0)
Alkaline Phosphatase: 96 U/L (ref 38–126)
Anion gap: 12 (ref 5–15)
BUN: 13 mg/dL (ref 6–20)
CO2: 23 mmol/L (ref 22–32)
Calcium: 9.5 mg/dL (ref 8.9–10.3)
Chloride: 102 mmol/L (ref 98–111)
Creatinine, Ser: 0.88 mg/dL (ref 0.44–1.00)
GFR calc Af Amer: >60 mL/min (ref >60)
GFR calc non Af Amer: >60 mL/min (ref >60)
Glucose, Bld: 116 mg/dL — ABNORMAL HIGH (ref 70–99)
Potassium: 3.6 mmol/L (ref 3.5–5.1)
Sodium: 137 mmol/L (ref 135–145)
Total Bilirubin: 0.7 mg/dL (ref 0.3–1.2)
Total Protein: 7.7 g/dL (ref 6.5–8.1)

## 2019-06-21 LAB — URINALYSIS, ROUTINE W REFLEX MICROSCOPIC
Bilirubin Urine: NEGATIVE
Glucose, UA: NEGATIVE mg/dL
Hgb urine dipstick: NEGATIVE
Ketones, ur: NEGATIVE mg/dL
Leukocytes,Ua: NEGATIVE
Nitrite: NEGATIVE
Protein, ur: NEGATIVE mg/dL
Specific Gravity, Urine: 1.02 (ref 1.005–1.030)
pH: 5 (ref 5.0–8.0)

## 2019-06-21 LAB — POCT URINALYSIS DIP (MANUAL ENTRY)
Bilirubin, UA: NEGATIVE
Glucose, UA: NEGATIVE mg/dL
Ketones, POC UA: NEGATIVE mg/dL
Leukocytes, UA: NEGATIVE
Nitrite, UA: NEGATIVE
Protein Ur, POC: NEGATIVE mg/dL
Spec Grav, UA: 1.025 (ref 1.010–1.025)
Urobilinogen, UA: 0.2 E.U./dL
pH, UA: 5 (ref 5.0–8.0)

## 2019-06-21 LAB — CBC WITH DIFFERENTIAL/PLATELET
Abs Immature Granulocytes: 0.02 10*3/uL (ref 0.00–0.07)
Basophils Absolute: 0 10*3/uL (ref 0.0–0.1)
Basophils Relative: 0 %
Eosinophils Absolute: 0.1 10*3/uL (ref 0.0–0.5)
Eosinophils Relative: 1 %
HCT: 44.1 % (ref 36.0–46.0)
Hemoglobin: 13.6 g/dL (ref 12.0–15.0)
Immature Granulocytes: 0 %
Lymphocytes Relative: 40 %
Lymphs Abs: 3 10*3/uL (ref 0.7–4.0)
MCH: 27.6 pg (ref 26.0–34.0)
MCHC: 30.8 g/dL (ref 30.0–36.0)
MCV: 89.5 fL (ref 80.0–100.0)
Monocytes Absolute: 0.6 10*3/uL (ref 0.1–1.0)
Monocytes Relative: 7 %
Neutro Abs: 3.8 10*3/uL (ref 1.7–7.7)
Neutrophils Relative %: 52 %
Platelets: 270 10*3/uL (ref 150–400)
RBC: 4.93 MIL/uL (ref 3.87–5.11)
RDW: 14.1 % (ref 11.5–15.5)
WBC: 7.5 10*3/uL (ref 4.0–10.5)
nRBC: 0 % (ref 0.0–0.2)

## 2019-06-21 LAB — I-STAT BETA HCG BLOOD, ED (MC, WL, AP ONLY): I-stat hCG, quantitative: <5 m[IU]/mL (ref <5)

## 2019-06-21 LAB — TROPONIN I (HIGH SENSITIVITY)
Troponin I (High Sensitivity): 8 ng/L (ref <18)
Troponin I (High Sensitivity): 8 ng/L (ref <18)

## 2019-06-21 LAB — LIPASE, BLOOD: Lipase: 32 U/L (ref 11–51)

## 2019-06-21 MED ORDER — HYDROCODONE-ACETAMINOPHEN 5-325 MG PO TABS: 1.0000 | ORAL_TABLET | Freq: Four times a day (QID) | ORAL | 0 refills | Status: DC | PRN

## 2019-06-21 MED ORDER — SODIUM CHLORIDE 0.9% FLUSH: 3.0000 mL | Freq: Once | INTRAVENOUS | Status: DC

## 2019-06-21 NOTE — Progress Notes (Signed)
   SUBJECTIVE:   CHIEF COMPLAINT / HPI:   54 year old obese female with remote history of gallstones but still has her gallbladder presents with 4-day history of abdominal pain. Patient was working on Thursday when she had acute onset epigastric pain but symmetrically radiated to both sides of her back.  Pain is progressively worsened over the weekend and is now unbearable.  She had to call out from work the pain was so bad.  She has had nausea but no vomiting.  Unable to tolerate a normal diet and has only eaten a few saltine crackers since onset and only a few sips of fluids.  Had loose stools last week but has not had a bowel movement since Friday.  Did not look to see if there is any blood in her stool.  She feels that her abdomen is more bloated than usual.  Motion of any kind increases the pain in her abdomen and is rated as an 8 out of 10 at this moment.  She has tried Tylenol, ibuprofen, Protonix with no improvement.  Denies any fevers or sick contacts.  Got her first Covid shot approximately 2 weeks ago. History of gallstones and GERD and was evaluated by general surgery in 2015.  Ultimately did not get cholecystectomy as symptoms resolved with Protonix.  PERTINENT  PMH / PSH: Gallstones, abdominal pain 5 to 6 years ago  OBJECTIVE:   BP 106/80   Pulse 83   Ht 5\' 4"  (1.626 m)   Wt 240 lb 2 oz (108.9 kg)   SpO2 97%   BMI 41.22 kg/m   General: In obvious pain, pleasant, able to participate in exam Cardiac: RRR, normal heart sounds, no murmurs. 2+ radial and PT pulses bilaterally Respiratory: CTAB, normal effort, No wheezes, rales or rhonchi Abdomen: Firm, distended, negative guarding or masses.  Acute tenderness with palpation of epigastric and right upper quadrant areas.  Negative Murphy sign.  Bowel sounds diminished diffusely.  Did not appreciate pulsatile mass. Extremities: no edema or cyanosis. WWP. Skin: warm and dry, no rashes noted Neuro: alert and oriented x4, no focal  deficits Psych: Normal affect and mood  ASSESSMENT/PLAN:   Abdominal pain History suspicious for multiple etiologies of abdominal pain.  Most notably cholelithiasis with a history 6 years ago.  Tried scheduling right upper quadrant ultrasound outpatient but appointment was not available for a week and patient states this pain is unbearable so she chose to present to the emergency department today for further evaluation. Since the pain was not improved with her regular GERD treatment this is less likely.  Would also consider peptic/duodenal ulcer, pancreatitis.  Had plan to obtain CBC, CMP, lipase in clinic but since she is going to the emergency department we have canceled these orders. Low suspicion for atypical chest pain presentation with no history of heart disease in the timing of onset not associated with exertion and no associated diaphoresis. With lack of bowel movements and decreased bowel sounds distally and anorexia have high suspicion for small bowel obstruction as well so was going to obtain KUB but again was canceled due to ED presentation. Urinalysis was collected and showed trace blood so would also consider renal stones on differential. Recommended patient avoid NSAIDs until etiology of pain has been discovered.     [place]

## 2019-06-21 NOTE — ED Triage Notes (Signed)
Pt has been having left upper abdominal pain since last Thursday which increased significantly last Saturday. Pt states that she was seen at her MD today and was sent here for further evaluation. Pt took antacid and antigas medication at home without relief.  Pt has pain in upper abdomen which wraps around to back.  Pt states that it feels like "a real bad stomach ache" pt has had some nausea and lack of appetite with this.  No SOB or CP.

## 2019-06-21 NOTE — Patient Instructions (Signed)
It was a pleasure to see you today!  To summarize our discussion for this visit:  Please continue to take Tylenol as needed for your abdominal pain.  Try to refrain from taking any Advil or ibuprofen.  It is important to stay hydrated so try to take sips of water throughout the day.  We are getting some labs today to check for causes of your abdominal pain as well as some imaging.  I will let you know when the results of those have come back.  Some additional health maintenance measures we should update are: Health Maintenance Due  Topic Date Due  . PNEUMOCOCCAL POLYSACCHARIDE VACCINE AGE 37-64 HIGH RISK  Never done  . FOOT EXAM  Never done  . OPHTHALMOLOGY EXAM  Never done  . COLONOSCOPY  10/26/2018  .    Call the clinic at [phone] if your symptoms worsen or you have any concerns.   Thank you for allowing me to take part in your care,  Dr. Doristine Mango

## 2019-06-21 NOTE — Assessment & Plan Note (Addendum)
History suspicious for multiple etiologies of abdominal pain.  Most notably cholelithiasis with a history 6 years ago.  Tried scheduling right upper quadrant ultrasound outpatient but appointment was not available for a week and patient states this pain is unbearable so she chose to present to the emergency department today for further evaluation. Since the pain was not improved with her regular GERD treatment this is less likely.  Would also consider peptic/duodenal ulcer, pancreatitis.  Had plan to obtain CBC, CMP, lipase in clinic but since she is going to the emergency department we have canceled these orders. Low suspicion for atypical chest pain presentation with no history of heart disease in the timing of onset not associated with exertion and no associated diaphoresis. With lack of bowel movements and decreased bowel sounds distally and anorexia have high suspicion for small bowel obstruction as well so was going to obtain KUB but again was canceled due to ED presentation. Urinalysis was collected and showed trace blood so would also consider renal stones on differential. Recommended patient avoid NSAIDs until etiology of pain has been discovered.

## 2019-06-24 NOTE — ED Provider Notes (Signed)
[facility] EMERGENCY DEPARTMENT Provider Note   CSN: [id number] Arrival date & time: 06/21/19  1526     History Chief Complaint  Patient presents with  . Abdominal Pain  . Back Pain    Lisa Sullivan is a 54 y.o. female.  HPI    54 year old female with abdominal pain.  Intermittent over the past 4 to 5 days.  Pain is primarily epigastric but also radiates into her back.  She was leg pain is progressively worsening.  Associated nausea.  No vomiting.  No urinary complaints.  No significant change in her stools.  She has a known history of gallstones with evaluation years ago but the symptoms she was had at that time resolved.  Past Medical History:  Diagnosis Date  . Gall stone   . Hypertension     Patient Active Problem List   Diagnosis Date Noted  . Pruritic rash 05/06/2019  . Type 2 diabetes mellitus without complications ([place]) [id number]  . Depression 01/23/2015  . Abdominal pain 08/04/2012  . Hypertension 02/28/2011  . Goiter 02/28/2011  . OBESITY, UNSPECIFIED 03/27/2010  . SMOKER 03/27/2010  . 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 CESAREAN SECTION    . TUBAL LIGATION       OB History   No obstetric history on file.     Family History  Problem Relation Age of Onset  . Diabetes Mother   . Stroke Father   . Breast cancer Paternal Grandmother   . Colon cancer Neg Hx   . Esophageal cancer Neg Hx   . Stomach cancer Neg Hx     Social History   Tobacco Use  . Smoking status: Current Some Day Smoker    Packs/day: 0.50    Types: Cigarettes  . Smokeless tobacco: Never Used  Substance Use Topics  . Alcohol use: Yes    Comment: socially   . Drug use: No    Home Medications Prior to Admission medications   Medication Sig Start Date End Date Taking? Authorizing Provider  acetaminophen (TYLENOL) 650 MG CR tablet Take 650 mg by mouth every 8 (eight) hours as needed for pain.    Yes [provider]  albuterol (PROVENTIL HFA;VENTOLIN HFA) 108 (90 Base) MCG/ACT inhaler Inhale 2 puffs into the lungs every 6 (six) hours as needed for wheezing or shortness of breath. 07/30/17  Yes Diallo, Earna Coder, MD  cetirizine (ZYRTEC) 10 MG tablet TAKE 1 TABLET(10 MG) BY MOUTH DAILY 03/03/19  Yes Enid Derry, [place], DO  diclofenac sodium (VOLTAREN) 1 % GEL Apply 4 g topically 4 (four) times daily. 10/07/18  Yes Enid Derry, [place], DO  fluticasone (FLONASE) 50 MCG/ACT nasal spray SHAKE LIQUID AND USE 2 SPRAYS IN EACH NOSTRIL DAILY Patient taking differently: Place 2 sprays into both nostrils 2 (two) times daily as needed for allergies.  03/03/19  Yes Enid Derry, [place], DO  hydrochlorothiazide (HYDRODIURIL) 25 MG tablet Take 1 tablet (25 mg total) by mouth daily. 03/03/19  Yes Enid Derry, [place], DO  metFORMIN (GLUCOPHAGE-XR) 500 MG 24 hr tablet Take 2 tablets (1,000 mg total) by mouth 2 (two) times a day. 10/07/18  Yes Enid Derry, [place], DO  pantoprazole (PROTONIX) 40 MG tablet Take 1 tablet (40 mg total) by mouth daily. 03/03/19  Yes Enid Derry, [place], DO  triamcinolone cream (KENALOG) 0.1 % Apply 1 application topically 2 (two) times daily as needed. On hands. Avoid touching your face and other body parts. Patient taking differently: Apply  1 application topically 2 (two) times daily as needed (rash). On hands. Avoid touching your face and other body parts. 05/28/19  Yes Wilber Oliphant, MD  atorvastatin (LIPITOR) 20 MG tablet Take 1 tablet (20 mg total) by mouth daily at 6 PM. Patient not taking: Reported on 06/21/2019 03/12/19   Shirley, [place], DO  HYDROcodone-acetaminophen (NORCO/VICODIN) 5-325 MG tablet Take 1-2 tablets by mouth every 6 (six) hours as needed for moderate pain. 06/21/19   Virgel Manifold, MD  naproxen (NAPROSYN) 375 MG tablet Take 1 tablet (375 mg total) by mouth 2 (two) times daily. Patient not taking: Reported on 06/21/2019 05/26/18   Charlesetta Shanks, MD  hydrochlorothiazide (HYDRODIURIL) 25  MG tablet Take 1 tablet (25 mg total) by mouth daily. 01/22/18   Shirley, [place], DO    Allergies    Bee venom, Latex, and Sulfa antibiotics  Review of Systems   Review of Systems All systems reviewed and negative, other than as noted in HPI.Physical Exam Updated Vital Signs BP (!) 145/90 (BP Location: Left Arm)   Pulse 68   Temp 98.1 F (36.7 C) (Oral)   Resp 16   Wt 108.9 kg   SpO2 99%   BMI 41.20 kg/m   Physical Exam Vitals and nursing note reviewed.  Constitutional:      General: She is not in acute distress.    Appearance: She is well-developed.  HENT:     Head: Normocephalic and atraumatic.  Eyes:     General:        Right eye: No discharge.        Left eye: No discharge.     Conjunctiva/sclera: Conjunctivae normal.  Cardiovascular:     Rate and Rhythm: Normal rate and regular rhythm.     Heart sounds: Normal heart sounds. No murmur. No friction rub. No gallop.   Pulmonary:     Effort: Pulmonary effort is normal. No respiratory distress.     Breath sounds: Normal breath sounds.  Abdominal:     General: There is no distension.     Palpations: Abdomen is soft.     Tenderness: There is abdominal tenderness.     Comments: Epigastric and right upper quadrant tenderness without rebound or guarding.  Musculoskeletal:        General: No tenderness.     Cervical back: Neck supple.  Skin:    General: Skin is warm and dry.  Neurological:     Mental Status: She is alert.  Psychiatric:        Behavior: Behavior normal.        Thought Content: Thought content normal.     ED Results / Procedures / Treatments   Labs (all labs ordered are listed, but only abnormal results are displayed) Labs Reviewed  COMPREHENSIVE METABOLIC PANEL - Abnormal; Notable for the following components:      Result Value   Glucose, Bld 116 (*)    All other components within normal limits  URINALYSIS, ROUTINE W REFLEX MICROSCOPIC - Abnormal; Notable for the following components:    APPearance HAZY (*)    All other components within normal limits  LIPASE, BLOOD  CBC WITH DIFFERENTIAL/PLATELET  I-STAT BETA HCG BLOOD, ED ([facility], [facility], [facility] ONLY)  TROPONIN I (HIGH SENSITIVITY)  TROPONIN I (HIGH SENSITIVITY)    EKG EKG Interpretation  Date/Time:  Monday June 21 2019 16:29:27 EDT Ventricular Rate:  73 PR Interval:  172 QRS Duration: 92 QT Interval:  386 QTC Calculation: 425 R Axis:   53  Text Interpretation: Normal sinus rhythm Normal ECG Confirmed by Virgel Manifold [phone]) on 06/21/2019 8:50:46 PM   Radiology No results found.   US Abdomen Limited  Result Date: 06/21/2019 CLINICAL DATA:  Upper abdominal pain five days.  Cholelithiasis. EXAM: ULTRASOUND ABDOMEN LIMITED RIGHT UPPER QUADRANT COMPARISON:  08/03/2013 FINDINGS: Gallbladder: A large shadowing mobile gallstone measures 2.4 cm. No gallbladder wall thickening is present. No sonographic Percell Miller sign is reported. Common bile duct: Diameter: 3.9 mm, within normal limits Liver: The liver is mildly echogenic. No discrete lesions are present. No intrahepatic biliary dilation is present. Portal vein is patent on color Doppler imaging with normal direction of blood flow towards the liver. Other: None. IMPRESSION: 1. Cholelithiasis without evidence for cholecystitis. A 2.4 cm mobile stone is present 2. Hepatic steatosis. Electronically Signed   By: San Morelle M.D.   On: 06/21/2019 21:45  Procedures Procedures (including critical care time)  Medications Ordered in ED Medications - No data to display  ED Course  I have reviewed the triage vital signs and the nursing notes.  Pertinent labs & imaging results that were available during my care of the patient were reviewed by me and considered in my medical decision making (see chart for details).    MDM Rules/Calculators/A&P                     54 year old female with symptomatic cholelithiasis.  LFTs/lipase fine.  As needed pain medication.  General surgical  follow-up.  Return precautions discussed.  Final Clinical Impression(s) / ED Diagnoses Final diagnoses:  Symptomatic cholelithiasis    Rx / DC Orders ED Discharge Orders         Ordered    HYDROcodone-acetaminophen (NORCO/VICODIN) 5-325 MG tablet  Every 6 hours PRN     06/21/19 2201           Virgel Manifold, MD 06/24/19 1933

## 2019-07-13 ENCOUNTER — Other Ambulatory Visit: Payer: Self-pay | Admitting: Family Medicine

## 2019-08-07 IMAGING — MG DIGITAL SCREENING BILATERAL MAMMOGRAM WITH CAD
4 series · 4 of 4 positions shown · non-contrast
Comparison: Previous exam(s).

CLINICAL DATA: Screening.

EXAM:
DIGITAL SCREENING BILATERAL MAMMOGRAM WITH CAD

[R CC]
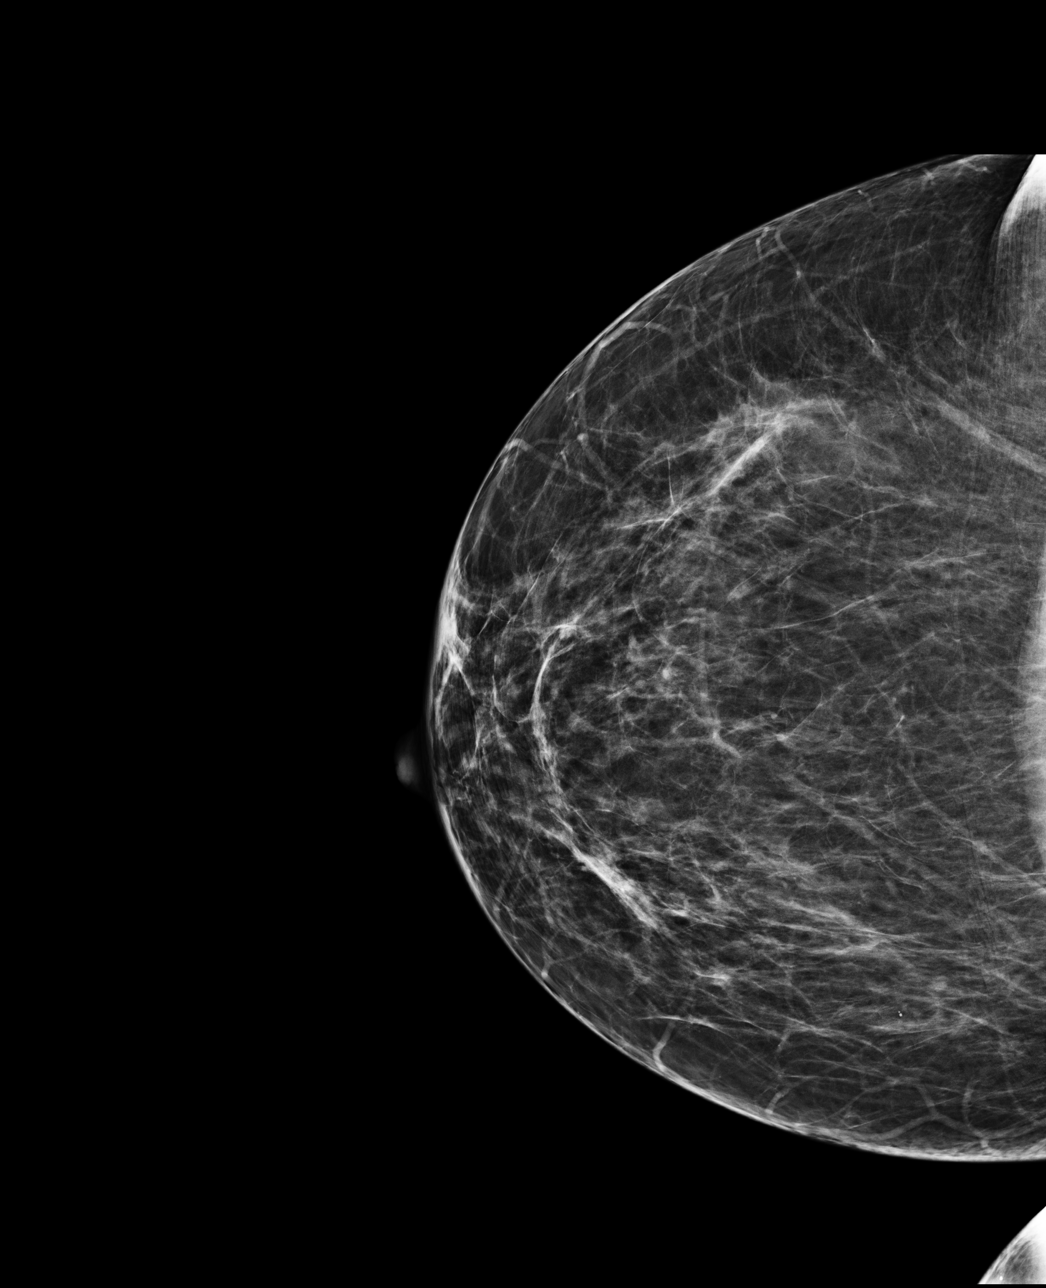

[L CC]
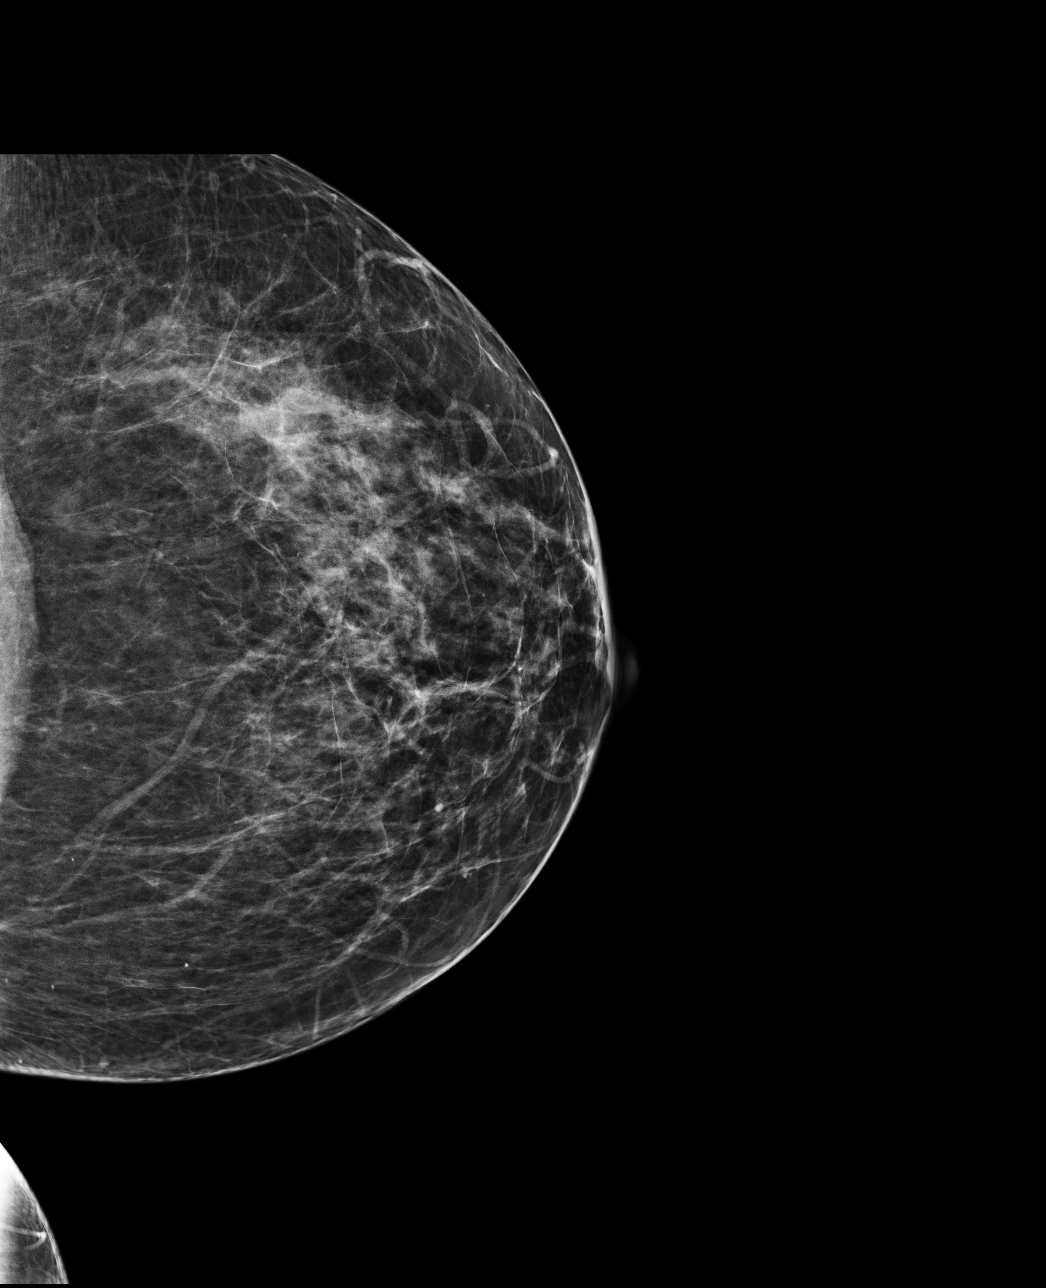

[R MLO]
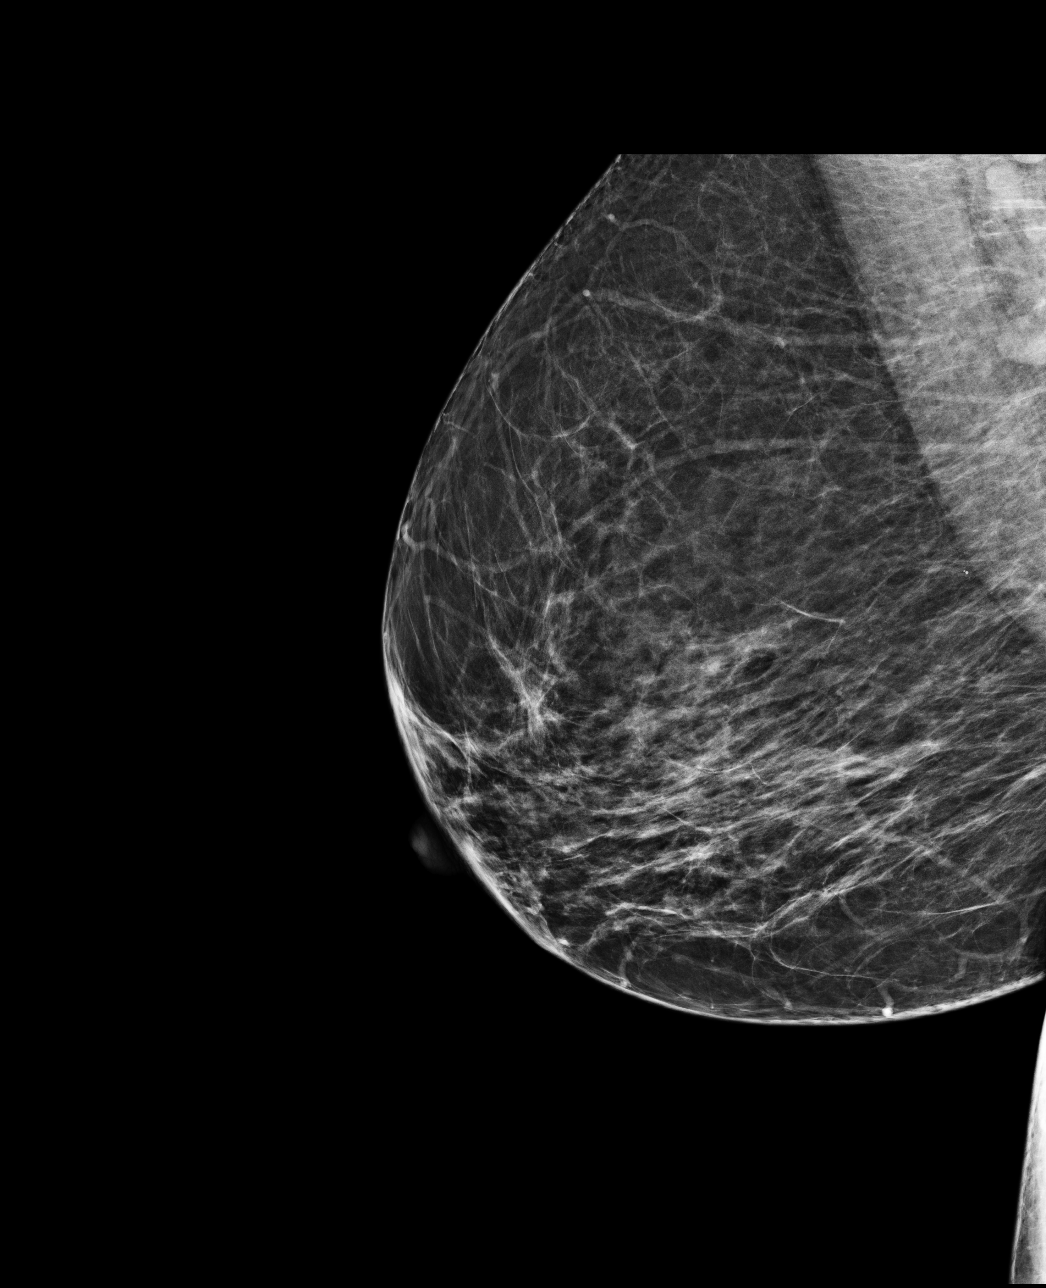

[L MLO]
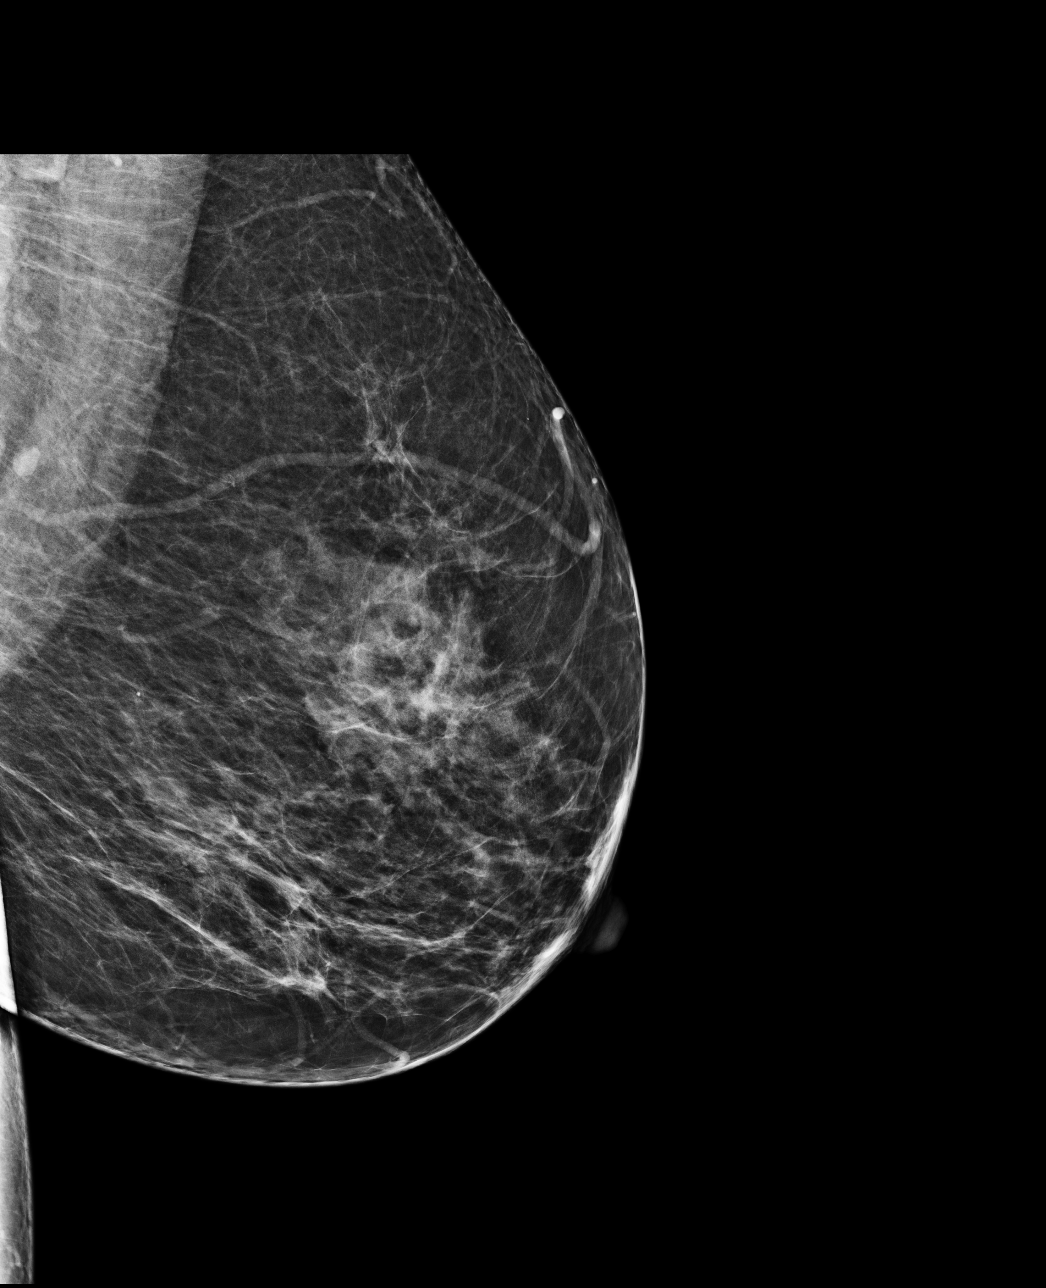

[4 of 4 positions shown; findings below may reference images not displayed]

ACR Breast Density Category b: There are scattered areas of
fibroglandular density.
FINDINGS: There are no findings suspicious for malignancy. Images were
processed with CAD.
IMPRESSION: No mammographic evidence of malignancy. A result letter of this
screening mammogram will be mailed directly to the patient.

RECOMMENDATION:
Screening mammogram in one year. (Code:AS-G-LCT)

BI-RADS CATEGORY  1: Negative.

## 2019-09-09 ENCOUNTER — Telehealth (INDEPENDENT_AMBULATORY_CARE_PROVIDER_SITE_OTHER): Payer: BLUE CROSS/BLUE SHIELD | Admitting: Family Medicine

## 2019-09-09 ENCOUNTER — Other Ambulatory Visit: Payer: Self-pay

## 2019-09-09 ENCOUNTER — Encounter: Payer: Self-pay | Admitting: Family Medicine

## 2019-09-09 VITALS — BP 123/88 | HR 93

## 2019-09-09 DIAGNOSIS — B9689 Other specified bacterial agents as the cause of diseases classified elsewhere: Secondary | ICD-10-CM | POA: Diagnosis not present

## 2019-09-09 DIAGNOSIS — J019 Acute sinusitis, unspecified: Secondary | ICD-10-CM | POA: Diagnosis not present

## 2019-09-09 DIAGNOSIS — L301 Dyshidrosis [pompholyx]: Secondary | ICD-10-CM | POA: Diagnosis not present

## 2019-09-09 MED ORDER — AMOXICILLIN-POT CLAVULANATE 875-125 MG PO TABS: 1.0000 | ORAL_TABLET | Freq: Two times a day (BID) | ORAL | 0 refills | Status: DC

## 2019-09-09 MED ORDER — BETAMETHASONE DIPROPIONATE 0.05 % EX CREA: TOPICAL_CREAM | Freq: Two times a day (BID) | CUTANEOUS | 0 refills | Status: DC

## 2019-09-09 NOTE — Assessment & Plan Note (Addendum)
Given patient's course over 6 days without any improvement, will start Augmentin twice daily x7 days for empiric treatment of acute bacterial rhinosinusitis.  Encourage patient to continue taking Zyrtec and Flonase as there seems to be an allergic component with itchy eyes and throat.  If patient does not have any improvement, instructed to follow-up.

## 2019-09-09 NOTE — Progress Notes (Signed)
[facility] Telemedicine Visit  Patient consented to have virtual visit and was identified by name and date of birth. Method of visit: Telephone  Encounter participants: Patient: Lisa Sullivan - located at Home Provider: Wilber Oliphant - located at [facility] Others (if applicable): none  Chief Complaint: Congestion  HPI: Nasal congestion started last Saturday. No improvement in symptoms. Frontal headaches, sinus pain. Previously, mucus clear but now yellowish. Itchy eyes and throat, but has not noticed allergies recently.  No SOB, fevers. Does report feeling tired, fatigue. Takes zyrtec and flonase daily. Has been going on for about 6 days with no signs of improvement.   Patient reports that her dyshidrotic eczema on her hands has been worsening.  The last time she took steroids was in April.  She reports that her hands are very swollen and she is almost unable to close them.  She does report some blisters forming.  ROS: per HPI  Pertinent PMHx: smoker, obesity, dyshidrotic eczema   Exam:  BP 123/88   Pulse 93   Respiratory: Congested.  No shortness of breath, can complete sentences.  Assessment/Plan:  Acute bacterial rhinosinusitis Given patient's course over 6 days without any improvement, will start Augmentin twice daily x7 days for empiric treatment of acute bacterial rhinosinusitis.  Encourage patient to continue taking Zyrtec and Flonase as there seems to be an allergic component with itchy eyes and throat.  If patient does not have any improvement, instructed to follow-up.    DYSHIDROTIC ECZEMA, HANDS Patient reports acute worsening of her dyshidrotic eczema.  Previously on medium to low potency steroids.  Will send betamethasone dipropionate cream to pharmacy.  Patient should take care to not touch her face or other sensitive areas while she has the steroid cream on.  After applying, patient should place gloves on her hands.  If no improvement, patient will  need to follow-up for possible oral prednisone therapy.    Time spent during visit with patient: 20 minutes

## 2019-09-09 NOTE — Assessment & Plan Note (Signed)
Patient reports acute worsening of her dyshidrotic eczema.  Previously on medium to low potency steroids.  Will send betamethasone dipropionate cream to pharmacy.  Patient should take care to not touch her face or other sensitive areas while she has the steroid cream on.  After applying, patient should place gloves on her hands.  If no improvement, patient will need to follow-up for possible oral prednisone therapy.

## 2019-09-16 ENCOUNTER — Other Ambulatory Visit: Payer: Self-pay | Admitting: Family Medicine

## 2019-10-06 ENCOUNTER — Encounter: Payer: Self-pay | Admitting: Family Medicine

## 2019-10-06 ENCOUNTER — Ambulatory Visit (INDEPENDENT_AMBULATORY_CARE_PROVIDER_SITE_OTHER): Payer: BLUE CROSS/BLUE SHIELD | Admitting: Family Medicine

## 2019-10-06 ENCOUNTER — Other Ambulatory Visit: Payer: Self-pay

## 2019-10-06 VITALS — BP 128/70 | HR 67 | Wt 236.0 lb

## 2019-10-06 DIAGNOSIS — G4700 Insomnia, unspecified: Secondary | ICD-10-CM | POA: Insufficient documentation

## 2019-10-06 DIAGNOSIS — E119 Type 2 diabetes mellitus without complications: Secondary | ICD-10-CM | POA: Diagnosis not present

## 2019-10-06 DIAGNOSIS — L301 Dyshidrosis [pompholyx]: Secondary | ICD-10-CM | POA: Diagnosis not present

## 2019-10-06 DIAGNOSIS — I10 Essential (primary) hypertension: Secondary | ICD-10-CM | POA: Diagnosis not present

## 2019-10-06 DIAGNOSIS — E1169 Type 2 diabetes mellitus with other specified complication: Secondary | ICD-10-CM

## 2019-10-06 DIAGNOSIS — E785 Hyperlipidemia, unspecified: Secondary | ICD-10-CM

## 2019-10-06 DIAGNOSIS — G4709 Other insomnia: Secondary | ICD-10-CM

## 2019-10-06 MED ORDER — TRAZODONE HCL 50 MG PO TABS: 25.0000 mg | ORAL_TABLET | Freq: Every evening | ORAL | 0 refills | Status: DC | PRN

## 2019-10-06 NOTE — Assessment & Plan Note (Signed)
Assessment: Dyshidrotic eczema which has not improved with steroid creams and with systemic prednisone.  Patient request referral to dermatology as she wants to find a solution to improve her symptoms. Plan: -We will provide ambulatory referral to dermatology per patient's request
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
## 2019-10-06 NOTE — Patient Instructions (Addendum)
It was great to see you!  Our plans for today:  -Today we discussed her dyshidrotic eczema.  Because you have had steroid creams as well as oral steroids for this in the past I will provide a referral for dermatology -For your difficulty in sleeping I am prescribing a medication called trazodone which you can take at night. -I would like to make a follow-up appointment in the next 1-2 weeks to get blood work and to go through medical problems.  Take care and seek immediate care sooner if you develop any concerns.   Dr. [facility]
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
## 2019-10-07 LAB — BASIC METABOLIC PANEL
BUN/Creatinine Ratio: 19 (ref 9–23)
BUN: 19 mg/dL (ref 6–24)
CO2: 23 mmol/L (ref 20–29)
Calcium: 9.9 mg/dL (ref 8.7–10.2)
Chloride: 99 mmol/L (ref 96–106)
Creatinine, Ser: 1.01 mg/dL — ABNORMAL HIGH (ref 0.57–1.00)
GFR calc Af Amer: 72 mL/min/{1.73_m2} (ref >59)
GFR calc non Af Amer: 63 mL/min/{1.73_m2} (ref >59)
Glucose: 169 mg/dL — ABNORMAL HIGH (ref 65–99)
Potassium: 3.8 mmol/L (ref 3.5–5.2)
Sodium: 138 mmol/L (ref 134–144)

## 2019-10-07 LAB — LIPID PANEL
Chol/HDL Ratio: 4.5 ratio — ABNORMAL HIGH (ref 0.0–4.4)
Cholesterol, Total: 190 mg/dL (ref 100–199)
HDL: 42 mg/dL (ref >39)
LDL Chol Calc (NIH): 101 mg/dL — ABNORMAL HIGH (ref 0–99)
Triglycerides: 279 mg/dL — ABNORMAL HIGH (ref 0–149)
VLDL Cholesterol Cal: 47 mg/dL — ABNORMAL HIGH (ref 5–40)

## 2019-10-07 LAB — HEMOGLOBIN A1C
Est. average glucose Bld gHb Est-mCnc: 183 mg/dL
Hgb A1c MFr Bld: 8 % — ABNORMAL HIGH (ref 4.8–5.6)

## 2019-10-15 ENCOUNTER — Telehealth: Payer: Self-pay | Admitting: *Deleted

## 2019-10-15 NOTE — Telephone Encounter (Signed)
Patient was referred to [facility] for her hands but their next new patient appointment is not until January.  Patient wants to be seen sooner than this and would like to try other offices to see if that's an option.  I left patient a message and told her that I can send her information to [facility] but their first available is not until October.  If patient is willing to travel we can also try [facility] as they may have the option for more availability.  Will wait to hear back from her.  Calyx Hawker,CMA

## 2019-10-20 ENCOUNTER — Encounter: Payer: Self-pay | Admitting: Family Medicine

## 2019-10-20 ENCOUNTER — Other Ambulatory Visit: Payer: Self-pay

## 2019-10-20 ENCOUNTER — Ambulatory Visit: Payer: BLUE CROSS/BLUE SHIELD | Admitting: Family Medicine

## 2019-10-20 VITALS — BP 130/80 | HR 67 | Wt 235.0 lb

## 2019-10-20 DIAGNOSIS — E785 Hyperlipidemia, unspecified: Secondary | ICD-10-CM | POA: Diagnosis not present

## 2019-10-20 DIAGNOSIS — L301 Dyshidrosis [pompholyx]: Secondary | ICD-10-CM

## 2019-10-20 DIAGNOSIS — E1169 Type 2 diabetes mellitus with other specified complication: Secondary | ICD-10-CM | POA: Diagnosis not present

## 2019-10-20 DIAGNOSIS — E119 Type 2 diabetes mellitus without complications: Secondary | ICD-10-CM

## 2019-10-20 LAB — GLUCOSE, POCT (MANUAL RESULT ENTRY): POC Glucose: 190 mg/dl — AB (ref 70–99)

## 2019-10-20 MED ORDER — METFORMIN HCL ER 500 MG PO TB24: 1000.0000 mg | ORAL_TABLET | Freq: Every day | ORAL | 1 refills | Status: DC

## 2019-10-20 MED ORDER — ATORVASTATIN CALCIUM 40 MG PO TABS: 40.0000 mg | ORAL_TABLET | Freq: Every day | ORAL | 1 refills | Status: DC

## 2019-10-20 MED ORDER — DAPAGLIFLOZIN PROPANEDIOL 5 MG PO TABS: 5.0000 mg | ORAL_TABLET | Freq: Every day | ORAL | 1 refills | Status: DC

## 2019-10-20 MED ORDER — EPINEPHRINE 0.3 MG/0.3ML IJ SOAJ: 0.3000 mg | INTRAMUSCULAR | 1 refills | Status: DC | PRN

## 2019-10-20 NOTE — Assessment & Plan Note (Signed)
Assessment: Patient with cholesterol of 101.  Current medication include atorvastatin 20 mg Plan: -Increased atorvastatin 20 mg atorvastatin 40 mg patient is not having any adverse effects for this -We will check direct LDL in 3 months

## 2019-10-20 NOTE — Assessment & Plan Note (Signed)
Assessment: Dyshidrotic eczema which has been resistant to steroid creams as well as systemic steroid treatments.  Patient finally has an appointment scheduled with dermatology for next week. Plan: -Patient plans to keep the appointment scheduled for dermatology for next week.  Plans to have their note forwarded to [place].

## 2019-10-20 NOTE — Assessment & Plan Note (Signed)
Assessment: A1c worsened to 8.0 during last appointment.  Patient currently on Metformin 1000 mg once per day.  Would not tolerate higher dosing due to GI side effects.  Will prescribe Farxiga. Plan: -Prescribing Farxiga as above -If too expensive patient plans to let us know in which case I will try different option.  It appears this is a better covered option with her insurance and [employer]. -Follow-up in 3 weeks for BMP for Farxiga -Follow-up in 3 months for A1c recheck.

## 2019-10-20 NOTE — Patient Instructions (Signed)
It was great to see you!  Our plans for today:  -I want you to keep your appointment with dermatology next week.  Please asked them to send me their notes if you do not mind. -I want to increase your cholesterol medicine from atorvastatin 20 to atorvastatin 40.  I have sent in a refill for this at the new dose.  Between now and then you can take 2 pills of your atorvastatin 20 to equal 40 total milligrams per day. -I am prescribing a medication for your diabetes called Farxiga.  I have sent this to the pharmacy.  I would like for you to follow-up in 3 weeks for recheck and your kidney function on this medicine. -I have sent a refill for your EpiPen as well as your Metformin per your request.  I have also sent you a referral for ophthalmology for diabetic eye exam. -After our follow-up in 3 weeks I would like to see you back in about 3 months to check your A1c again.   Take care and seek immediate care sooner if you develop any concerns.   Dr. [facility]

## 2019-10-20 NOTE — Progress Notes (Signed)
    SUBJECTIVE:   CHIEF COMPLAINT / HPI:   Type 2 diabetes Patient with recent A1c of 8.0.  Current medications include Metformin 1000 mg once daily.  She thinks that her diabetes is mostly worsened because of recent steroid dosing.  Patient states she tried Metformin 1000 mg twice daily but had GI side effects which limited this.  Hyperlipidemia Recent LDL of 101.  Patient is on medications include atorvastatin 20.  No complaints currently.  No issues with musculoskeletal pains since being on atorvastatin 20 mg.  Dyshidrotic eczema Patient still endorsing issues with this but does not want to attempt another steroid course as these have not been helpful and she believes these have worsened her diabetes.  I agree with her on this plan.  Patient does have an appointment with dermatology scheduled for next week.  She is hopeful this will provide some relief from her dyshidrotic eczema.  PERTINENT  PMH / PSH: History of dyshidrotic eczema  OBJECTIVE:   BP (!) 130/80   Pulse 67   Wt (!) 235 lb (106.6 kg)   SpO2 98%   BMI 40.34 kg/m    General: NAD, pleasant, able to participate in exam Cardiac: RRR, no murmurs. Respiratory: CTAB, normal effort, No wheezes, rales or rhonchi Skin: Rash of the palmar surface of the hands with increased pigmentation and excoriations, no erythema but with thickened and scattered vesicles consistent with dyshidrotic eczema.  Nontender. Neuro: alert, no obvious focal deficits Psych: Normal affect and mood  ASSESSMENT/PLAN:   Type 2 diabetes mellitus without complications ([facility]) Assessment: A1c worsened to 8.0 during last appointment.  Patient currently on Metformin 1000 mg once per day.  Would not tolerate higher dosing due to GI side effects.  Will prescribe Farxiga. Plan: -Prescribing Farxiga as above -If too expensive patient plans to let us know in which case I will try different option.  It appears this is a better covered option with her insurance  and [employer]. -Follow-up in 3 weeks for BMP for Farxiga -Follow-up in 3 months for A1c recheck.  Hyperlipidemia associated with type 2 diabetes mellitus ([place]) Assessment: Patient with cholesterol of 101.  Current medication include atorvastatin 20 mg Plan: -Increased atorvastatin 20 mg atorvastatin 40 mg patient is not having any adverse effects for this -We will check direct LDL in 3 months  DYSHIDROTIC ECZEMA, HANDS Assessment: Dyshidrotic eczema which has been resistant to steroid creams as well as systemic steroid treatments.  Patient finally has an appointment scheduled with dermatology for next week. Plan: -Patient plans to keep the appointment scheduled for dermatology for next week.  Plans to have their note forwarded to [place].   -Provided patient referral for ophthalmologist per her request for diabetic eye exam. 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  Lurline Del, [place]

## 2019-11-05 ENCOUNTER — Other Ambulatory Visit: Payer: Self-pay | Admitting: Family Medicine

## 2020-01-18 ENCOUNTER — Other Ambulatory Visit: Payer: Self-pay | Admitting: Family Medicine

## 2020-02-22 ENCOUNTER — Other Ambulatory Visit: Payer: Self-pay | Admitting: Family Medicine

## 2020-03-20 ENCOUNTER — Other Ambulatory Visit: Payer: Self-pay

## 2020-03-20 MED ORDER — ATORVASTATIN CALCIUM 40 MG PO TABS: 40.0000 mg | ORAL_TABLET | Freq: Every day | ORAL | 1 refills | Status: DC

## 2020-03-23 ENCOUNTER — Encounter: Payer: Self-pay | Admitting: Family Medicine

## 2020-03-23 ENCOUNTER — Ambulatory Visit (INDEPENDENT_AMBULATORY_CARE_PROVIDER_SITE_OTHER): Payer: BLUE CROSS/BLUE SHIELD | Admitting: Family Medicine

## 2020-03-23 ENCOUNTER — Other Ambulatory Visit: Payer: Self-pay

## 2020-03-23 VITALS — BP 120/82 | HR 85 | Ht 64.0 in | Wt 228.4 lb

## 2020-03-23 DIAGNOSIS — E119 Type 2 diabetes mellitus without complications: Secondary | ICD-10-CM | POA: Diagnosis not present

## 2020-03-23 DIAGNOSIS — J069 Acute upper respiratory infection, unspecified: Secondary | ICD-10-CM | POA: Diagnosis not present

## 2020-03-23 LAB — POCT GLYCOSYLATED HEMOGLOBIN (HGB A1C): Hemoglobin A1C: 7.4 % — AB (ref 4.0–5.6)

## 2020-03-23 MED ORDER — DAPAGLIFLOZIN PROPANEDIOL 5 MG PO TABS: ORAL_TABLET | ORAL | 1 refills | Status: DC

## 2020-03-23 NOTE — Assessment & Plan Note (Signed)
Assessment: Type 2 diabetes which is becoming a little bit better controlled.  The patient continues on Metformin which should be taken as 1000 mg daily, however she is often only taken 500 mg daily due to GI side effects.  She continues on [place] which she takes on a regular basis.  The patient's A1c is improved to 7.4 today from 8.0 at the last appointment.  Patient is not up-to-date on her diabetic eye exam or her diabetic foot exam which we were unable to perform today due to time constraints. Plan: -I recommend the patient schedule a follow-up appointment so we could discuss possibly initiating injectable GLP's weekly for glucose control, should we be able to find one that will work with her insurance we can potentially eliminate her Metformin as this caused her GI side effects. -Patient states that she is getting new insurance at the start of the year and so it makes sense to check this once that is in place as well.

## 2020-03-23 NOTE — Assessment & Plan Note (Signed)
Assessment: 54 year old female with 4 days of cough, chills, malaise.  She is vaccinated against COVID-19 but has not yet had her booster.  She denies sick contacts.  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  Because of this she is concerned about the possibility of COVID-19.  Patient does not have any shortness of breath. Plan: -COVID-19 testing performed today -Discussed return precautions in detail with the patient -We will call patient with her COVID-19 results when they return.  Recommended that she isolate as if she has COVID-19 in the meanwhile

## 2020-03-23 NOTE — Patient Instructions (Signed)
It was great to see you! Thank you for allowing me to participate in your care!  Our plans for today:  -Today we checked your A1c.  It was 7.4.  At our next appointment we can discuss some options for reducing your Metformin such as including a GLP medication which is a once weekly injectable medication. -We also discussed your rib pain, I think this is due to your ongoing cough and physical exam supports that.  We are testing you for COVID-19 today and I should have that result back in about 2 days.  I will call you and let you know the results when they return. -I recommend that you isolate in the meantime. -If you develop any trouble breathing, crushing chest pains, vomiting to the extent that she cannot keep down fluids, or high fevers that do not resolve with Tylenol I recommend that you go to the emergency department. -I would like for you make a follow-up appointment for my next available spot and then again about 1 week after the stool can get caught up on some of your health maintenance items.  Take care and seek immediate care sooner if you develop any concerns.   Dr. Jackelyn Poling, DO [facility]

## 2020-03-23 NOTE — Progress Notes (Signed)
SUBJECTIVE:   CHIEF COMPLAINT / HPI:   Viral URI with cough and associated rib pain: Patient is a 54 year old female presenting today for rib pain. Cough started Sunday night. Has been taking mucinex. She had chills Sunday night but normal temperature. No sore throat. Also had nausea and fatigue. No diarrhea or vomiting. She states her rib pain started up a few days after her cough. She is vaccinated against Covid with last shot in April.  Diabetic Follow Up: Patient is a 54 y.o. female who present today for diabetic follow up.   Patient endorses difficulties with medication compliance  Home medications include: Metformin 1000 mg daily, Farxiga 5 mg daily Patient endorses taking these medications as prescribed. She is taking metformin 1x per day often because of abdominal adverse effects.   Most recent A1Cs:  Lab Results  Component Value Date   HGBA1C 7.4 (A) 03/23/2020   HGBA1C 8.0 (H) 10/06/2019   HGBA1C 7.2 (A) 05/28/2019   Last Microalbumin, LDL, Creatinine: Lab Results  Component Value Date   MICROALBUR 10 03/03/2019   LDLCALC 101 (H) 10/06/2019   CREATININE 1.01 (H) 10/06/2019   Patient does not check blood glucose on a regular basis.  Patient is not up to date on diabetic eye. Patient is not up to date on diabetic foot exam.   PERTINENT  PMH / PSH: T2DM  OBJECTIVE:   BP 120/82   Pulse 85   Ht 5\' 4"  (1.626 m)   Wt 228 lb 6.4 oz (103.6 kg)   SpO2 96%   BMI 39.20 kg/m    General: NAD, pleasant, able to participate in exam HEENT: Some congestion present in her nares bilaterally Cardiac: RRR, no murmurs. Respiratory: CTAB, normal effort, No wheezes, rales or rhonchi MSK: Patient with some discomfort in the musculature surrounding her ribs on both the left and right side, this pain is exacerbated by stretching the area such as reaching over her head.  The patient does not have any rib tenderness on palpation. Skin: warm and dry, no rashes noted Neuro:  alert, no obvious focal deficits Psych: Normal affect and mood  ASSESSMENT/PLAN:   Viral URI with cough Assessment: 54 year old female with 4 days of cough, chills, malaise.  She is vaccinated against COVID-19 but has not yet had her booster.  She denies sick contacts.  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  Because of this she is concerned about the possibility of COVID-19.  Patient does not have any shortness of breath. Plan: -COVID-19 testing performed today -Discussed return precautions in detail with the patient -We will call patient with her COVID-19 results when they return.  Recommended that she isolate as if she has COVID-19 in the meanwhile  Type 2 diabetes mellitus without complications (HCC) Assessment: Type 2 diabetes which is becoming a little bit better controlled.  The patient continues on Metformin which should be taken as 1000 mg daily, however she is often only taken 500 mg daily due to GI side effects.  She continues on 59 which she takes on a regular basis.  The patient's A1c is improved to 7.4 today from 8.0 at the last appointment.  Patient is not up-to-date on her diabetic eye exam or her diabetic foot exam which we were unable to perform today due to time constraints. Plan: -I recommend the patient schedule a follow-up appointment so we could discuss possibly initiating injectable GLP's weekly for glucose control, should we be able to find one  that will work with her insurance we can potentially eliminate her Metformin as this caused her GI side effects. -Patient states that she is getting new insurance at the start of the year and so it makes sense to check this once that is in place as well.    -Patient plans to make follow-up appointment my next 2 available slots to address multiple health maintenance items that need to be addressed such as her colonoscopy, mammogram, etc.  At that time we will also discuss her concerns of anxiety and further  discussion about initiating a weekly GLP medication for her diabetes.  Lurline Del, [place]    This note was prepared using Dragon voice recognition software and may include unintentional dictation errors due to the inherent limitations of voice recognition software.

## 2020-03-25 LAB — SARS-COV-2, NAA 2 DAY TAT

## 2020-03-25 LAB — NOVEL CORONAVIRUS, NAA: SARS-CoV-2, NAA: NOT DETECTED

## 2020-04-10 ENCOUNTER — Ambulatory Visit: Payer: BLUE CROSS/BLUE SHIELD | Admitting: Family Medicine

## 2020-04-22 ENCOUNTER — Other Ambulatory Visit: Payer: Self-pay | Admitting: Family Medicine

## 2020-04-23 ENCOUNTER — Other Ambulatory Visit: Payer: Self-pay | Admitting: Family Medicine

## 2020-05-22 ENCOUNTER — Other Ambulatory Visit: Payer: Self-pay

## 2020-05-22 MED ORDER — HYDROCHLOROTHIAZIDE 25 MG PO TABS: 25.0000 mg | ORAL_TABLET | Freq: Every day | ORAL | 3 refills | Status: DC

## 2020-05-25 NOTE — Patient Instructions (Signed)
It was great to see you! Thank you for allowing me to participate in your care!  Our plans for today:  -Today we checked your A1c, previously it was 7.4, today it was 7.2.  Continue current medications at this time. -I have sent in a referral for your mammogram and for your colonoscopy.  We are providing you a card with the information to get your mammogram complete. -We are starting a medication for anxiety and depression called zoloft. I will call you in one week to see how you are doing and would like to see you back in 4 weeks to see if we should increase the dose. - I would like for you to visit the website PsychologyToday.[email or web address] and search for a psychologist to provide counseling services to you because as we discussed taking both the medication and having counseling provides additional benefit with anxiety and depression.  Take care and seek immediate care sooner if you develop any concerns.   Dr. Lurline Del, DO [facility]   If you are feeling suicidal or depression symptoms worsen please immediately go to:   [place]  [address] [place], [place] [phone] Crisis [phone]    . If you are thinking about harming yourself or having thoughts of suicide, or if you know someone who is, seek help right away. . Call your doctor or mental health care provider. . Call 911 or go to a hospital emergency room to get immediate help, or ask a friend or family member to help you do these things. . Call the [place] National Suicide Prevention Lifeline's toll-free, 24-hour hotline at 1-800-273-TALK 760-726-3909) or TTY: 1-800-799-4 TTY 973-008-9172) to talk to a trained counselor. . If you are in crisis, make sure you are not left alone.  . If someone else is in crisis, make sure he or she is not left alone   [facility] [employer] (Domestic Violence, Rape & Victim Assistance [phone]  [facility]  [place]    (ONLY from 8am-4pm)    [phone]  [facility] (24/7)   [phone]  [place] National Suicide Hotline   ([phone] Diamantina Monks)

## 2020-05-25 NOTE — Progress Notes (Signed)
SUBJECTIVE:   CHIEF COMPLAINT / HPI:   Anxiety: Patient is a 55 year old female that presents today for follow-up to discuss anxiety.  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  Her mother has a history of liver cancer is just recently been brought into hospice.  She states that she is getting more stress from family members because of this and that she goes to visit her mother often at hospice and that provides additional stress.  She also feels some guilt due to the situation.  She denies any SI/HI.  Diabetic Follow Up: Patient is a 55 y.o. female who present today for diabetic follow up.   Patient endorses no problems  Home medications include: Metformin 1000 mg with breakfast, Farxiga 5 mg daily. Patient endorses taking these medications as prescribed.  Most recent A1Cs:  Lab Results  Component Value Date   HGBA1C 7.2 (A) 05/26/2020   HGBA1C 7.4 (A) 03/23/2020   HGBA1C 8.0 (H) 10/06/2019   Last Microalbumin, LDL, Creatinine: Lab Results  Component Value Date   MICROALBUR 10 03/03/2019   LDLCALC 101 (H) 10/06/2019   CREATININE 1.01 (H) 10/06/2019   Patient does not check blood glucose on a regular basis.  Patient is not up to date on diabetic eye. Patient is not up to date on diabetic foot exam.  PERTINENT  PMH / PSH: History diabetes  OBJECTIVE:   BP 137/88   Pulse 69   Ht _0  (1.626 m)   Wt 233 lb 9.6 oz (106 kg)   SpO2 98%   BMI 40.10 kg/m    GAD 7 : Generalized Anxiety Score 05/26/2020  Nervous, Anxious, on Edge 3  Control/stop worrying 3  Worry too much - different things 3  Trouble relaxing 3  Restless 3  Easily annoyed or irritable 3  Afraid - awful might happen 3  Total GAD 7 Score 21  Anxiety Difficulty Extremely difficult    Flowsheet Row Office Visit from 05/26/2020 in [place]  PHQ-9 Total Score 8     Diabetic foot exam was performed.  No  deformities or other abnormal visual findings.  Posterior tibialis and dorsalis pulse intact bilaterally.  Intact to touch and monofilament testing bilaterally.   General: Tearful but in no acute distress, pleasant, able to participate in exam Respiratory: No respiratory distress Skin: warm and dry, no rashes noted Psych: Normal affect and mood  ASSESSMENT/PLAN:   Anxiety Elevated GAD-7 score of 21.  PHQ-9 score of 8.  No HI/SI.  No current antidepressive/antianxiety medications.  Patient is interested in starting a antianxiety/antidepression medication.  Will initiate Zoloft.  We will also recommend patient find a psychologist to undergo counseling with her current situation.  Discussed the website PsychologyToday.com with the patient as this can provide an easy way for her to find a psychologist that would fit her goals and met her insurance in the area.  Patient states that she plans to do this.  Will call patient in 1 week to ensure she is doing well and follow-up in 4 weeks.   Type 2 diabetes mellitus without complications [facility]) Assessment: 55 year old female with history of type 2 diabetes currently on Metformin 1000 mg with breakfast and 5 mg daily Farxiga.  Previous A1c of 7.4, today of 7.2. Plan: -Continue current meds -Referral for diabetic eye exam placed -Consider GLP for diabetes   We will order mammogram,  colonoscopy  Lurline Del, [place]    This note was prepared using Dragon voice recognition software and may include unintentional dictation errors due to the inherent limitations of voice recognition software.

## 2020-05-26 ENCOUNTER — Encounter: Payer: Self-pay | Admitting: Family Medicine

## 2020-05-26 ENCOUNTER — Other Ambulatory Visit: Payer: Self-pay

## 2020-05-26 ENCOUNTER — Ambulatory Visit: Payer: BLUE CROSS/BLUE SHIELD | Admitting: Family Medicine

## 2020-05-26 VITALS — BP 137/88 | HR 69 | Ht 64.0 in | Wt 233.6 lb

## 2020-05-26 DIAGNOSIS — Z1211 Encounter for screening for malignant neoplasm of colon: Secondary | ICD-10-CM | POA: Diagnosis not present

## 2020-05-26 DIAGNOSIS — E119 Type 2 diabetes mellitus without complications: Secondary | ICD-10-CM

## 2020-05-26 DIAGNOSIS — F419 Anxiety disorder, unspecified: Secondary | ICD-10-CM | POA: Insufficient documentation

## 2020-05-26 DIAGNOSIS — Z1231 Encounter for screening mammogram for malignant neoplasm of breast: Secondary | ICD-10-CM

## 2020-05-26 LAB — POCT GLYCOSYLATED HEMOGLOBIN (HGB A1C): Hemoglobin A1C: 7.2 % — AB (ref 4.0–5.6)

## 2020-05-26 MED ORDER — SERTRALINE HCL 50 MG PO TABS: 50.0000 mg | ORAL_TABLET | Freq: Every day | ORAL | 0 refills | Status: DC

## 2020-05-26 MED ORDER — DAPAGLIFLOZIN PROPANEDIOL 5 MG PO TABS: ORAL_TABLET | ORAL | 1 refills | Status: DC

## 2020-05-26 MED ORDER — HYDROCHLOROTHIAZIDE 25 MG PO TABS: 25.0000 mg | ORAL_TABLET | Freq: Every day | ORAL | 3 refills | Status: DC

## 2020-05-26 NOTE — Assessment & Plan Note (Signed)
Assessment: 55 year old female with history of type 2 diabetes currently on Metformin 1000 mg with breakfast and 5 mg daily Farxiga.  Previous A1c of 7.4, today of 7.2. Plan: -Continue current meds -Referral for diabetic eye exam placed -Consider GLP for diabetes

## 2020-05-26 NOTE — Assessment & Plan Note (Signed)
Elevated GAD-7 score of 21.  PHQ-9 score of 8.  No HI/SI.  No current antidepressive/antianxiety medications.  Patient is interested in starting a antianxiety/antidepression medication.  Will initiate Zoloft.  We will also recommend patient find a psychologist to undergo counseling with her current situation.  Discussed the website PsychologyToday.com with the patient as this can provide an easy way for her to find a psychologist that would fit her goals and met her insurance in the area.  Patient states that she plans to do this.  Will call patient in 1 week to ensure she is doing well and follow-up in 4 weeks.

## 2020-06-20 ENCOUNTER — Other Ambulatory Visit: Payer: Self-pay | Admitting: Family Medicine

## 2020-06-22 ENCOUNTER — Other Ambulatory Visit: Payer: Self-pay

## 2020-06-22 MED ORDER — CETIRIZINE HCL 10 MG PO TABS: ORAL_TABLET | ORAL | 3 refills | Status: DC

## 2020-06-22 NOTE — Patient Instructions (Signed)
It was great to see you! Thank you for allowing me to participate in your care!  I recommend that you always bring your medications to each appointment as this makes it easy to ensure we are on the correct medications and helps [place] not miss when refills are needed.  Our plans for today:  - I have ordered a sleep study, they should call you in the next 1-2 weeks to schedule it -You can increase her trazodone to 100 mg at night. -I would like for you to schedule a follow-up appointment in 3 months or once your sleep study has finished for [place] to discuss next steps. -If you need anything do not hesitate to reach out.  Take care and seek immediate care sooner if you develop any concerns.   Dr. Lurline Del, [place]

## 2020-06-22 NOTE — Progress Notes (Signed)
SUBJECTIVE:   CHIEF COMPLAINT / HPI:   Follow-up-anxiety: Patient is 55 year old female presents for follow-up after starting on Zoloft.  During the previous appointment she had a GAD-7 score of 21 and a PHQ-9 score of 8 with no SI/HI.  She was also provided resources to contact a psychologist that would fit her goals for counseling/therapy.  Today she states she has not taken the pills and is working on the depression "herself with God".  She states that she took them for about 2 days but decided that this was not a route she wanted to go.  She states she lost her mother in early March and has been dealing with that but that she is going to go to counseling and will let me know if she needs anything from me.  She denies any SI/HI.  She states that she is not interested in filling out a PHQ-9 or GAD-7 at this time.  Waking up at night: Has had issue for years. She often falls asleep easily but often wakes up periodically throughout the night. Has tried trazodone but not a lot of benefit. She does not think she snores, she has never woken up from snoring or to catch her breath.  She states she feels very tired from shortly after she wakes up throughout the day. She uses some melatonin but without a lot of benefit.  She states that her mother had sleep apnea and she has a family history of this.  She states she has never been evaluated for sleep apnea before.  PERTINENT  PMH / PSH: History of anxiety  OBJECTIVE:   BP 122/80   Pulse 79   Ht 5\' 4"  (1.626 m)   Wt 232 lb 6 oz (105.4 kg)   SpO2 96%   BMI 39.89 kg/m    GAD 7 : Generalized Anxiety Score 05/26/2020  Nervous, Anxious, on Edge 3  Control/stop worrying 3  Worry too much - different things 3  Trouble relaxing 3  Restless 3  Easily annoyed or irritable 3  Afraid - awful might happen 3  Total GAD 7 Score 21  Anxiety Difficulty Extremely difficult   Reviewed GAD-7 refused today.   General: NAD, pleasant, able to participate in  exam Cardiac: RRR, no murmurs. Respiratory: CTAB, normal effort Psych: Normal affect, nearly tearful at times when discussing the loss of her mother.  ASSESSMENT/PLAN:   Anxiety Assessment: 55 year old female with history of anxiety recently started on Zoloft.  She states that she only took about 1 or 2 days of this and decided this medicine was not for her.  She is not interested in changing medications at this time.  She states that she is working through things, including the recent loss of her mother.  She is going to start going to counseling.  She states that she was interested in increasing her trazodone medication to see if this will help her with sleeping at night and maybe give her some benefit from an anxiety/depression standpoint as well.  She denies SI/HI at this time.  She did not want to complete a PHQ-9 or GAD-7 today. -We will increase trazodone 200 mg at night -No safety concerns at this time -Recommended that she continue with continuing to doing counseling -Reinforced that anytime she needs anything from [place] to not hesitate to reach out  Insomnia Assessment: 55 year old female with a history of insomnia who is now interested in being worked up for possible sleep apnea.  She does not  think that she snores at night but states that even after waking up in the morning she feels tired throughout the day.  She states that this has been going on for years but she feels it is a little bit worse after the recent loss of her mother.  She states that she is able to fall asleep easily but wakes up multiple times throughout the night.  She states that her mother had a history of sleep apnea. Plan: -We will order sleep study -Discussed increasing her trazodone and will will increase this from 50 mg to 100 mg at night. -Follow-up in 3 months or once the sleep study is complete.     Lurline Del, [place]    This note was prepared using Dragon voice  recognition software and may include unintentional dictation errors due to the inherent limitations of voice recognition software.

## 2020-06-27 ENCOUNTER — Ambulatory Visit (INDEPENDENT_AMBULATORY_CARE_PROVIDER_SITE_OTHER): Payer: Self-pay | Admitting: Family Medicine

## 2020-06-27 ENCOUNTER — Encounter: Payer: Self-pay | Admitting: Family Medicine

## 2020-06-27 ENCOUNTER — Other Ambulatory Visit: Payer: Self-pay

## 2020-06-27 VITALS — BP 122/80 | HR 79 | Ht 64.0 in | Wt 232.4 lb

## 2020-06-27 DIAGNOSIS — G4709 Other insomnia: Secondary | ICD-10-CM

## 2020-06-27 DIAGNOSIS — F419 Anxiety disorder, unspecified: Secondary | ICD-10-CM

## 2020-06-27 NOTE — Assessment & Plan Note (Signed)
Assessment: 55 year old female with a history of insomnia who is now interested in being worked up for possible sleep apnea.  She does not think that she snores at night but states that even after waking up in the morning she feels tired throughout the day.  She states that this has been going on for years but she feels it is a little bit worse after the recent loss of her mother.  She states that she is able to fall asleep easily but wakes up multiple times throughout the night.  She states that her mother had a history of sleep apnea. Plan: -We will order sleep study -Discussed increasing her trazodone and will will increase this from 50 mg to 100 mg at night. -Follow-up in 3 months or once the sleep study is complete.

## 2020-06-27 NOTE — Assessment & Plan Note (Signed)
Assessment: 55 year old female with history of anxiety recently started on Zoloft.  She states that she only took about 1 or 2 days of this and decided this medicine was not for her.  She is not interested in changing medications at this time.  She states that she is working through things, including the recent loss of her mother.  She is going to start going to counseling.  She states that she was interested in increasing her trazodone medication to see if this will help her with sleeping at night and maybe give her some benefit from an anxiety/depression standpoint as well.  She denies SI/HI at this time.  She did not want to complete a PHQ-9 or GAD-7 today. -We will increase trazodone 200 mg at night -No safety concerns at this time -Recommended that she continue with continuing to doing counseling -Reinforced that anytime she needs anything from [place] to not hesitate to reach out

## 2020-06-29 ENCOUNTER — Encounter: Payer: Self-pay | Admitting: Internal Medicine

## 2020-07-22 ENCOUNTER — Other Ambulatory Visit: Payer: Self-pay | Admitting: Family Medicine

## 2020-08-08 LAB — HM DIABETES EYE EXAM

## 2020-09-04 ENCOUNTER — Ambulatory Visit (AMBULATORY_SURGERY_CENTER): Payer: Self-pay | Admitting: *Deleted

## 2020-09-04 ENCOUNTER — Telehealth: Payer: Self-pay | Admitting: *Deleted

## 2020-09-04 ENCOUNTER — Other Ambulatory Visit: Payer: Self-pay

## 2020-09-04 VITALS — Ht 64.0 in | Wt 233.0 lb

## 2020-09-04 DIAGNOSIS — Z86010 Personal history of colonic polyps: Secondary | ICD-10-CM

## 2020-09-04 MED ORDER — SUTAB 1479-225-188 MG PO TABS: 1.0000 | ORAL_TABLET | Freq: Once | ORAL | 0 refills | Status: DC

## 2020-09-04 MED ORDER — SUTAB 1479-225-188 MG PO TABS: 1.0000 | ORAL_TABLET | Freq: Once | ORAL | 0 refills | Status: AC

## 2020-09-04 NOTE — Telephone Encounter (Signed)
Virtual pre visit completed. Instructions emailed.

## 2020-09-04 NOTE — Progress Notes (Signed)
Virtual pre visit completed. Secure instructions sent to email [email or web address]   No egg or soy allergy known to patient  No issues with past sedation with any surgeries or procedures Patient denies ever being told they had issues or difficulty with intubation  No FH of Malignant Hyperthermia No diet pills per patient No home 02 use per patient  No blood thinners per patient  Pt denies issues with constipation  No A fib or A flutter  EMMI video to pt or via [place] 19 guidelines implemented in [place] today with Pt and RN  Pt is fully vaccinated  for Covid    Discussed with pt there will be an out-of-pocket cost for prep and that varies from $0 to 70 dollars   Due to the COVID-19 pandemic we are asking patients to follow certain guidelines.  Pt aware of COVID protocols and [facility] guidelines

## 2020-09-05 ENCOUNTER — Encounter: Payer: Self-pay | Admitting: Family Medicine

## 2020-09-15 ENCOUNTER — Telehealth: Payer: Self-pay

## 2020-09-15 ENCOUNTER — Ambulatory Visit: Payer: Self-pay

## 2020-09-15 ENCOUNTER — Encounter: Payer: Self-pay | Admitting: Family

## 2020-09-15 ENCOUNTER — Other Ambulatory Visit: Payer: Self-pay

## 2020-09-15 ENCOUNTER — Ambulatory Visit (INDEPENDENT_AMBULATORY_CARE_PROVIDER_SITE_OTHER): Payer: 59 | Admitting: Family

## 2020-09-15 ENCOUNTER — Ambulatory Visit
Admission: RE | Admit: 2020-09-15 | Discharge: 2020-09-15 | Disposition: A | Discharge status: Home / Self Care | Payer: 59 | Source: Ambulatory Visit | Attending: Family Medicine | Admitting: Family Medicine

## 2020-09-15 ENCOUNTER — Ambulatory Visit (INDEPENDENT_AMBULATORY_CARE_PROVIDER_SITE_OTHER): Payer: 59

## 2020-09-15 DIAGNOSIS — M25562 Pain in left knee: Secondary | ICD-10-CM | POA: Diagnosis not present

## 2020-09-15 DIAGNOSIS — M1731 Unilateral post-traumatic osteoarthritis, right knee: Secondary | ICD-10-CM | POA: Diagnosis not present

## 2020-09-15 DIAGNOSIS — M1712 Unilateral primary osteoarthritis, left knee: Secondary | ICD-10-CM | POA: Diagnosis not present

## 2020-09-15 DIAGNOSIS — G8929 Other chronic pain: Secondary | ICD-10-CM

## 2020-09-15 DIAGNOSIS — M25561 Pain in right knee: Secondary | ICD-10-CM

## 2020-09-15 DIAGNOSIS — Z1231 Encounter for screening mammogram for malignant neoplasm of breast: Secondary | ICD-10-CM

## 2020-09-15 MED ORDER — METHYLPREDNISOLONE ACETATE 40 MG/ML IJ SUSP
40.0000 mg | INTRAMUSCULAR | Status: AC | PRN
  Administered 2020-09-15: 40 mg via INTRA_ARTICULAR

## 2020-09-15 MED ORDER — LIDOCAINE HCL 1 % IJ SOLN
5.0000 mL | INTRAMUSCULAR | Status: AC | PRN
  Administered 2020-09-15: 5 mL

## 2020-09-15 NOTE — Progress Notes (Signed)
Office Visit Note   Patient: Lisa Sullivan           Date of Birth: 1965-10-17           MRN: [id number] Visit Date: 09/15/2020              Requested by: Lurline Del, DO [place] [place],  [place] [address] PCP: Lurline Del, DO  Chief Complaint  Patient presents with   Right Knee - Pain      HPI: The patient is a 55 year old woman who presents today complaining of bilateral knee pain.  This is been chronic.  The right knee has been bothering her since the fall in 2018.    She is having medial joint line pain of bilateral knees.  The right is much worse than the left she complains of aching with weightbearing this becomes unbearable she states she has to sit most of the day at work due to pain.  The right knee does pop lock and give way.  No mechanical symptoms of the left knee.  She last had Depo-Medrol injection of the right knee in June 2020.  She is unable to recall how well that injection did for her.  Assessment & Plan: Visit Diagnoses:  1. Acute pain of right knee     Plan: Depo-Medrol injection bilateral knees.  At her request we have requested approval for supplemental injection of the right knee.  The patient has not yet ready to consider total knee arthroplasty on the right  Follow-Up Instructions: No follow-ups on file.   Right Knee Exam   Muscle Strength  The patient has normal right knee strength.  Tenderness  The patient is experiencing tenderness in the medial joint line.  Range of Motion  The patient has normal right knee ROM.  Other  Swelling: moderate Effusion: no effusion present   Left Knee Exam   Muscle Strength  The patient has normal left knee strength.  Tenderness  The patient is experiencing tenderness in the medial joint line.  Range of Motion  The patient has normal left knee ROM.     Patient is alert, oriented, no adenopathy, well-dressed, normal affect, normal respiratory effort.   Imaging: No results  found. No images are attached to the encounter.  Labs: Lab Results  Component Value Date   HGBA1C 7.2 (A) 05/26/2020   HGBA1C 7.4 (A) 03/23/2020   HGBA1C 8.0 (H) 10/06/2019   REPTSTATUS 02/26/2007 FINAL 02/24/2007   GRAMSTAIN  02/24/2007    NO WBC SEEN RARE SQUAMOUS EPITHELIAL CELLS PRESENT FEW GRAM POSITIVE COCCI IN CLUSTERS IN PAIRS   CULT  02/24/2007    MODERATE METHICILLIN RESISTANT STAPHYLOCOCCUS AUREUS Note: RIFAMPIN AND GENTAMICIN SHOULD NOT BE USED AS SINGLE DRUGS FOR TREATMENT OF STAPH INFECTIONS. Contact laboratory if Clindamycin results needed. [phone] CRITICAL RESULT CALLED TO, READ BACK BY AND VERIFIED WITH: TONY SILVIANO 02/26/07 1020 BY  SMITHERSJ   LABORGA METHICILLIN RESISTANT STAPHYLOCOCCUS AUREUS 02/24/2007     Lab Results  Component Value Date   ALBUMIN 4.1 06/21/2019   ALBUMIN 4.2 09/02/2016   ALBUMIN 4.1 08/02/2013    No results found for: MG No results found for: VD25OH  No results found for: PREALBUMIN CBC EXTENDED Latest Ref Rng & Units 06/21/2019 03/03/2019 05/26/2018  WBC 4.0 - 10.5 K/uL 7.5 6.6 6.9  RBC 3.87 - 5.11 MIL/uL 4.93 4.99 5.12(H)  HGB 12.0 - 15.0 g/dL 13.6 13.9 13.7  HCT 36.0 - 46.0 % 44.1 42.8 45.8  PLT 150 - 400 K/uL 270 214 237  NEUTROABS 1.7 - 7.7 K/uL 3.8 - -  LYMPHSABS 0.7 - 4.0 K/uL 3.0 - -     There is no height or weight on file to calculate BMI.  Orders:  Orders Placed This Encounter  Procedures   XR Knee 1-2 Views Right   No orders of the defined types were placed in this encounter.    Procedures: Large Joint Inj: bilateral knee on 09/15/2020 10:16 AM Indications: pain Details: 18 G 1.5 in needle, anteromedial approach Medications (Right): 5 mL lidocaine 1 %; 40 mg methylPREDNISolone acetate 40 MG/ML Medications (Left): 5 mL lidocaine 1 %; 40 mg methylPREDNISolone acetate 40 MG/ML Consent was given by the patient.     Clinical Data: No additional findings.  ROS:  All other systems negative, except as  noted in the HPI. Review of Systems  Constitutional:  Negative for chills and fever.  Musculoskeletal:  Positive for arthralgias and myalgias. Negative for joint swelling.   Objective: Vital Signs: There were no vitals taken for this visit.  Specialty Comments:  No specialty comments available.  PMFS History: Patient Active Problem List   Diagnosis Date Noted   Anxiety 05/26/2020   Hyperlipidemia associated with type 2 diabetes mellitus ([place]) 10/20/2019   Insomnia 10/06/2019   Acute bacterial rhinosinusitis 09/09/2019   Type 2 diabetes mellitus without complications ([place]) 38/93/7342   Depression 01/23/2015   Viral URI with cough 08/09/2014   Hypertension 02/28/2011   Goiter 02/28/2011   OBESITY, UNSPECIFIED 03/27/2010   SMOKER 03/27/2010   DYSHIDROTIC ECZEMA, HANDS 03/27/2010   Past Medical History:  Diagnosis Date   Diabetes mellitus without complication ([place])    Gall stone    Hyperlipidemia    Hypertension     Family History  Problem Relation Age of Onset   Liver cancer Mother    Diabetes Mother    Stroke Father    Breast cancer Paternal Grandmother    Stomach cancer Other    Rectal cancer Other    Esophageal cancer Other    Colon polyps Other    Colon cancer Other     Past Surgical History:  Procedure Laterality Date   CESAREAN SECTION     CHOLECYSTECTOMY     TUBAL LIGATION     Social History   Occupational History   Not on file  Tobacco Use   Smoking status: Some Days    Packs/day: 0.50    Pack years: 0.00    Types: Cigarettes   Smokeless tobacco: Never  Vaping Use   Vaping Use: Never used  Substance and Sexual Activity   Alcohol use: Yes    Comment: socially    Drug use: No   Sexual activity: Not on file

## 2020-09-15 NOTE — Telephone Encounter (Signed)
Talked with patient and advised her that [facility] may not cover gel injection, but will submit it for Monovisc.  If not covered, patient would like for me to submit for TriVisc the second week of July.   Will contact patient before submitting.

## 2020-09-15 NOTE — Telephone Encounter (Signed)
VOB submitted for Monovisc, right knee. Pending BV. 

## 2020-09-15 NOTE — Telephone Encounter (Signed)
-----   Message from Suzan Slick, NP sent at 09/15/2020  9:42 AM EDT ----- Will you check on or resubmit for right knee

## 2020-09-19 ENCOUNTER — Telehealth: Payer: Self-pay | Admitting: Internal Medicine

## 2020-09-19 NOTE — Telephone Encounter (Signed)
Patient calling to inform Lisa Sullivan is too expensive..  Pt is requesting alternative med.. Plz advise  thanks

## 2020-09-19 NOTE — Telephone Encounter (Signed)
Called patient-no answer. Left a message that we have a sutab sample if the patient is able to pick them up today or tomorrow on the 3rd floor. I asked the patient to call us back to let us know so we can have the placed on the 3rd floor.

## 2020-09-20 NOTE — Telephone Encounter (Signed)
PT TO PICK UP SUTAB SAMPLE 3RD FLOOR THIS MORNING

## 2020-09-21 ENCOUNTER — Other Ambulatory Visit: Payer: Self-pay

## 2020-09-21 ENCOUNTER — Ambulatory Visit (AMBULATORY_SURGERY_CENTER): Payer: 59 | Admitting: Internal Medicine

## 2020-09-21 ENCOUNTER — Encounter: Payer: Self-pay | Admitting: Internal Medicine

## 2020-09-21 VITALS — BP 132/81 | HR 66 | Temp 97.1°F | Resp 14 | Ht 64.0 in | Wt 233.0 lb

## 2020-09-21 DIAGNOSIS — Z86010 Personal history of colonic polyps: Secondary | ICD-10-CM

## 2020-09-21 DIAGNOSIS — D123 Benign neoplasm of transverse colon: Secondary | ICD-10-CM

## 2020-09-21 MED ORDER — SODIUM CHLORIDE 0.9 % IV SOLN: 500.0000 mL | Freq: Once | INTRAVENOUS | Status: DC

## 2020-09-21 NOTE — Progress Notes (Signed)
Medical history reviewed with no changes noted. VS assessed by C.W 

## 2020-09-21 NOTE — Progress Notes (Signed)
PT taken to PACU. Monitors in place. VSS. Report given to RN. 

## 2020-09-21 NOTE — Op Note (Signed)
[facility] Patient Name: Lisa Sullivan Procedure Date: 09/21/2020 12:40 PM MRN: [id number] Endoscopist: Docia Chuck. Henrene Pastor , MD Age: 55 Referring MD:  Date of Birth: 08/05/1965 Gender: Female Account #: [id number] Procedure:                Colonoscopy with cold snare polypectomy x 1 Indications:              High risk colon cancer surveillance: Personal                            history of non-advanced adenoma (August 2015) Medicines:                Monitored Anesthesia Care Procedure:                Pre-Anesthesia Assessment:                           - Prior to the procedure, a History and Physical                            was performed, and patient medications and                            allergies were reviewed. The patient's tolerance of                            previous anesthesia was also reviewed. The risks                            and benefits of the procedure and the sedation                            options and risks were discussed with the patient.                            All questions were answered, and informed consent                            was obtained. Prior Anticoagulants: The patient has                            taken no previous anticoagulant or antiplatelet                            agents. ASA Grade Assessment: II - A patient with                            mild systemic disease. After reviewing the risks                            and benefits, the patient was deemed in                            satisfactory condition to undergo the procedure.  After obtaining informed consent, the colonoscope                            was passed under direct vision. Throughout the                            procedure, the patient's blood pressure, pulse, and                            oxygen saturations were monitored continuously. The                            Olympus CF-HQ190L [id number]) Colonoscope was                             introduced through the anus and advanced to the the                            cecum, identified by appendiceal orifice and                            ileocecal valve. The ileocecal valve, appendiceal                            orifice, and rectum were photographed. The quality                            of the bowel preparation was good. The colonoscopy                            was performed without difficulty. The patient                            tolerated the procedure well. The bowel preparation                            used was SUPREP via split dose instruction. Scope In: 12:43:55 PM Scope Out: 12:53:43 PM Scope Withdrawal Time: 0 hours 7 minutes 34 seconds  Total Procedure Duration: 0 hours 9 minutes 48 seconds  Findings:                 A 3 mm polyp was found in the transverse colon. The                            polyp was removed with a cold snare. Resection and                            retrieval were complete.                           The exam was otherwise without abnormality on                            direct and retroflexion views. Complications:  No immediate complications. Estimated blood loss:                            None. Estimated Blood Loss:     Estimated blood loss: none. Impression:               - One 3 mm polyp in the transverse colon, removed                            with a cold snare. Resected and retrieved.                           - The examination was otherwise normal on direct                            and retroflexion views. Recommendation:           - Repeat colonoscopy in 7 years for surveillance.                           - Patient has a contact number available for                            emergencies. The signs and symptoms of potential                            delayed complications were discussed with the                            patient. Return to normal activities tomorrow.                            Written  discharge instructions were provided to the                            patient.                           - Resume previous diet.                           - Continue present medications.                           - Await pathology results. Docia Chuck. Henrene Pastor, MD 09/21/2020 1:10:05 PM This report has been signed electronically.

## 2020-09-21 NOTE — Progress Notes (Signed)
Called to room to assist during endoscopic procedure.  Patient ID and intended procedure confirmed with present staff. Received instructions for my participation in the procedure from the performing physician.  

## 2020-09-21 NOTE — Patient Instructions (Signed)
YOU HAD AN ENDOSCOPIC PROCEDURE TODAY AT THE [facility]:   Refer to the procedure report that was given to you for any specific questions about what was found during the examination.  If the procedure report does not answer your questions, please call your gastroenterologist to clarify.  If you requested that your care partner not be given the details of your procedure findings, then the procedure report has been included in a sealed envelope for you to review at your convenience later.  YOU SHOULD EXPECT: Some feelings of bloating in the abdomen. Passage of more gas than usual.  Walking can help get rid of the air that was put into your GI tract during the procedure and reduce the bloating. If you had a lower endoscopy (such as a colonoscopy or flexible sigmoidoscopy) you may notice spotting of blood in your stool or on the toilet paper. If you underwent a bowel prep for your procedure, you may not have a normal bowel movement for a few days.  Please Note:  You might notice some irritation and congestion in your nose or some drainage.  This is from the oxygen used during your procedure.  There is no need for concern and it should clear up in a day or so.  SYMPTOMS TO REPORT IMMEDIATELY:   Following lower endoscopy (colonoscopy or flexible sigmoidoscopy):  Excessive amounts of blood in the stool  Significant tenderness or worsening of abdominal pains  Swelling of the abdomen that is new, acute  Fever of 100F or higher   Following upper endoscopy (EGD)  Vomiting of blood or coffee ground material  New chest pain or pain under the shoulder blades  Painful or persistently difficult swallowing  New shortness of breath  Fever of 100F or higher  Black, tarry-looking stools  For urgent or emergent issues, a gastroenterologist can be reached at any hour by calling [phone]. Do not use MyChart messaging for urgent concerns.    DIET:  We do recommend a small meal at first, but  then you may proceed to your regular diet.  Drink plenty of fluids but you should avoid alcoholic beverages for 24 hours.  ACTIVITY:  You should plan to take it easy for the rest of today and you should NOT DRIVE or use heavy machinery until tomorrow (because of the sedation medicines used during the test).    FOLLOW UP: Our staff will call the number listed on your records 48-72 hours following your procedure to check on you and address any questions or concerns that you may have regarding the information given to you following your procedure. If we do not reach you, we will leave a message.  We will attempt to reach you two times.  During this call, we will ask if you have developed any symptoms of COVID 19. If you develop any symptoms (ie: fever, flu-like symptoms, shortness of breath, cough etc.) before then, please call [phone].  If you test positive for Covid 19 in the 2 weeks post procedure, please call and report this information to us.    If any biopsies were taken you will be contacted by phone or by letter within the next 1-3 weeks.  Please call us at [phone] if you have not heard about the biopsies in 3 weeks.    SIGNATURES/CONFIDENTIALITY: You and/or your care partner have signed paperwork which will be entered into your electronic medical record.  These signatures attest to the fact that that the information above on   your After Visit Summary has been reviewed and is understood.  Full responsibility of the confidentiality of this discharge information lies with you and/or your care-partner. 

## 2020-09-26 ENCOUNTER — Telehealth: Payer: Self-pay | Admitting: *Deleted

## 2020-09-26 NOTE — Telephone Encounter (Signed)
  Follow up Call-  Call back number 09/21/2020  Post procedure Call Back phone  # [phone]  Permission to leave phone message Yes  Some recent data might be hidden     Patient questions:  Do you have a fever, pain , or abdominal swelling? No. Pain Score  0 *  Have you tolerated food without any problems? Yes.    Have you been able to return to your normal activities? Yes.    Do you have any questions about your discharge instructions: Diet   No. Medications  No. Follow up visit  No.  Do you have questions or concerns about your Care? No.  Actions: * If pain score is 4 or above: No action needed, pain <4.  Have you developed a fever since your procedure? no  2.   Have you had an respiratory symptoms (SOB or cough) since your procedure? no  3.   Have you tested positive for COVID 19 since your procedure no  4.   Have you had any family members/close contacts diagnosed with the COVID 19 since your procedure?  no   If yes to any of these questions please route to Joylene John, RN and Joella Prince, RN

## 2020-09-27 ENCOUNTER — Encounter: Payer: Self-pay | Admitting: Internal Medicine

## 2020-10-27 ENCOUNTER — Ambulatory Visit: Payer: 59

## 2020-10-30 ENCOUNTER — Ambulatory Visit: Payer: 59

## 2020-11-06 ENCOUNTER — Other Ambulatory Visit: Payer: Self-pay

## 2020-11-06 ENCOUNTER — Ambulatory Visit (INDEPENDENT_AMBULATORY_CARE_PROVIDER_SITE_OTHER): Payer: 59 | Admitting: Family Medicine

## 2020-11-06 VITALS — BP 123/82 | HR 72 | Ht 64.0 in

## 2020-11-06 DIAGNOSIS — R059 Cough, unspecified: Secondary | ICD-10-CM | POA: Diagnosis not present

## 2020-11-06 DIAGNOSIS — K219 Gastro-esophageal reflux disease without esophagitis: Secondary | ICD-10-CM

## 2020-11-06 DIAGNOSIS — R0602 Shortness of breath: Secondary | ICD-10-CM | POA: Diagnosis not present

## 2020-11-06 DIAGNOSIS — Z20822 Contact with and (suspected) exposure to covid-19: Secondary | ICD-10-CM | POA: Diagnosis not present

## 2020-11-06 DIAGNOSIS — J069 Acute upper respiratory infection, unspecified: Secondary | ICD-10-CM

## 2020-11-06 MED ORDER — ALBUTEROL SULFATE HFA 108 (90 BASE) MCG/ACT IN AERS: 2.0000 | INHALATION_SPRAY | Freq: Four times a day (QID) | RESPIRATORY_TRACT | 0 refills | Status: AC | PRN

## 2020-11-06 MED ORDER — BENZONATATE 200 MG PO CAPS: 200.0000 mg | ORAL_CAPSULE | Freq: Two times a day (BID) | ORAL | 0 refills | Status: DC | PRN

## 2020-11-06 MED ORDER — PANTOPRAZOLE SODIUM 40 MG PO TBEC: 40.0000 mg | DELAYED_RELEASE_TABLET | Freq: Every day | ORAL | 3 refills | Status: DC

## 2020-11-06 MED ORDER — BUDESONIDE 90 MCG/ACT IN AEPB
2.0000 | INHALATION_SPRAY | Freq: Two times a day (BID) | RESPIRATORY_TRACT | Status: DC
Start: 2020-11-06 — End: 2020-11-10

## 2020-11-06 NOTE — Progress Notes (Signed)
   SUBJECTIVE:   CHIEF COMPLAINT / HPI:   Chief Complaint  Patient presents with   Back Pain   Cough   Nausea     Lisa Sullivan is a 55 y.o. female here for flank pain that radiates to her back.  Has dry cough for the past 2 weeks but had yellow sputum a couple weeks ago. Took home COVID test and it was negative two weeks ago. Taking OTC cough medications with some relief. Pt reports myalgias resolved. Denies fever,  sore throat, changes to taste or smell,   Has intermittent acid reflux symptoms. Has been without her medications. Requests refill.    PERTINENT  PMH / PSH: reviewed and updated as appropriate   OBJECTIVE:   BP 123/82   Pulse 72   Ht '5\' 4"'$  (1.626 m)   SpO2 100%   BMI 39.99 kg/m    GEN:     alert, well appearing and no distress    HENT:  mucus membranes moist, oropharyngeal without lesions or erythema,  nares patent, no nasal discharge  EYES:   pupils equal and reactive, EOM intact NECK:  supple, normal ROM, no lymphadenopathy  RESP:  clear to auscultation bilaterally, no increased work of breathing  CVS:   regular rate and rhythm, no murmur, distal pulses intact   Skin:   warm and dry   ASSESSMENT/PLAN:   Viral URI with cough Pt with 2 weeks of productive cough. Suspect flank pain is 2/2 to cough. She had negative home COVID test. She is COVID vaccinated. History consistent with viral respiratory illness. Overall pt is well appearing, well hydrated, without respiratory distress. Discussed symptomatic treatment. COVID testing obtained and patient to qurantine until test results and longer if positive. - Tessalon Perles for cough  - continue to monitor for fevers  - continue Tylenol/ Motrin as needed for discomfort - nasal saline to help with his nasal congestion - Stressed hydration - Albuterol refilled  - Work note offered - Discussed ED precautions, understanding voiced   Gastroesophageal reflux disease Refilled protonix 40 mg daily      Lyndee Hensen, DO PGY-3, [place]

## 2020-11-06 NOTE — Patient Instructions (Addendum)
As discussed for your cough:  Stop by the pharmacy to pick up your prescriptions. Be sure to keep Tessalon Perles away from children. Take your inhalers at bedtime. Use your albuterol inhaler as needed throughout the day.   You were tested for COVID. It is important that you  isolate until her test returns in the next 24 hours.  Someone will call you if her test is positive. Take Tylenol for fever or discomfort. Nasal saline can help with nasal congestion. It is important for you to stay hydrated.  If your is not improving, follow up with your PCP.  If she has difficulty breathing, not drinking or having decreased urination, please go to the ED.   Take Care,   Dr. Susa Simmonds

## 2020-11-08 LAB — SARS-COV-2, NAA 2 DAY TAT

## 2020-11-08 LAB — NOVEL CORONAVIRUS, NAA: SARS-CoV-2, NAA: NOT DETECTED

## 2020-11-09 ENCOUNTER — Ambulatory Visit: Payer: Self-pay

## 2020-11-10 DIAGNOSIS — K219 Gastro-esophageal reflux disease without esophagitis: Secondary | ICD-10-CM | POA: Insufficient documentation

## 2020-11-10 NOTE — Assessment & Plan Note (Signed)
Refilled protonix 40 mg daily

## 2020-11-10 NOTE — Assessment & Plan Note (Addendum)
Pt with 2 weeks of productive cough. Suspect flank pain is 2/2 to cough. She had negative home COVID test. She is COVID vaccinated. History consistent with viral respiratory illness. Overall pt is well appearing, well hydrated, without respiratory distress. Discussed symptomatic treatment. COVID testing obtained and patient to qurantine until test results and longer if positive. - Tessalon Perles for cough  - continue to monitor for fevers  - continue Tylenol/ Motrin as needed for discomfort - nasal saline to help with his nasal congestion - Stressed hydration - Albuterol refilled  - Work note offered - Discussed ED precautions, understanding voiced

## 2020-11-13 ENCOUNTER — Other Ambulatory Visit: Payer: Self-pay

## 2020-11-13 ENCOUNTER — Telehealth: Payer: Self-pay

## 2020-11-13 ENCOUNTER — Encounter (HOSPITAL_COMMUNITY): Payer: Self-pay

## 2020-11-13 ENCOUNTER — Ambulatory Visit (HOSPITAL_COMMUNITY)
Admission: EM | Admit: 2020-11-13 | Discharge: 2020-11-13 | Disposition: A | Discharge status: Home / Self Care | Payer: 59 | Attending: Student | Admitting: Student

## 2020-11-13 DIAGNOSIS — R110 Nausea: Secondary | ICD-10-CM

## 2020-11-13 DIAGNOSIS — R42 Dizziness and giddiness: Secondary | ICD-10-CM | POA: Diagnosis not present

## 2020-11-13 LAB — CBG MONITORING, ED: Glucose-Capillary: 159 mg/dL — ABNORMAL HIGH (ref 70–99)

## 2020-11-13 MED ORDER — ONDANSETRON 8 MG PO TBDP: 8.0000 mg | ORAL_TABLET | Freq: Three times a day (TID) | ORAL | 0 refills | Status: DC | PRN

## 2020-11-13 NOTE — Discharge Instructions (Addendum)
-  Take the Zofran (ondansetron) up to 3 times daily for nausea and vomiting. Dissolve one pill under your tongue or between your teeth and your cheek. -Follow-up with PCP for diabetes recheck and to discuss stopping metformin -If you develop chest pain, shortness of breath, vision changes, the worst headache of your life- head straight to the ED or call 911.

## 2020-11-13 NOTE — ED Provider Notes (Signed)
[facility]    CSN: [id number] Arrival date & time: 11/13/20  1724      History   Chief Complaint Chief Complaint  Patient presents with   Dizziness   Nausea    HPI Lisa Sullivan is a 55 y.o. female presenting with lightheadedness and nausea for about 1 week.  States that she is currently recovering from a viral URI and is taking Best boy with some improvement in her cough, though this has persisted.  Medical history diabetes, hypertension, hyperlipidemia, cholecystectomy.  Endorses 1 week of lightheadedness, states that this only happens when she takes her metformin.  Also with some nausea but no vomiting, diarrhea, abdominal pain.  States that her symptoms are all worse when she takes the metformin.  She has skipped it 3 times this week and felt a lot better on those days.  Denies chest pain, shortness of breath, weakness.  Last bowel movement was today and was normal.  HPI  Past Medical History:  Diagnosis Date   Diabetes mellitus without complication ([place])    Gall stone    Hyperlipidemia    Hypertension     Patient Active Problem List   Diagnosis Date Noted   Gastroesophageal reflux disease 11/10/2020   Anxiety 05/26/2020   Hyperlipidemia associated with type 2 diabetes mellitus ([place]) 10/20/2019   Insomnia 10/06/2019   Type 2 diabetes mellitus without complications ([place]) Q000111Q   Depression 01/23/2015   Viral URI with cough 08/09/2014   Hypertension 02/28/2011   Goiter 02/28/2011   OBESITY, UNSPECIFIED 03/27/2010   SMOKER 03/27/2010   DYSHIDROTIC ECZEMA, HANDS 03/27/2010    Past Surgical History:  Procedure Laterality Date   CESAREAN SECTION     CHOLECYSTECTOMY     TUBAL LIGATION      OB History   No obstetric history on file.      Home Medications    Prior to Admission medications   Medication Sig Start Date End Date Taking? Authorizing Provider  ondansetron (ZOFRAN ODT) 8 MG disintegrating tablet Take 1 tablet (8 mg total)  by mouth every 8 (eight) hours as needed for nausea or vomiting. 11/13/20  Yes Hazel Sams, PA-C  acetaminophen (TYLENOL) 650 MG CR tablet Take 650 mg by mouth every 8 (eight) hours as needed for pain.    [provider]  albuterol (VENTOLIN HFA) 108 (90 Base) MCG/ACT inhaler Inhale 2 puffs into the lungs every 6 (six) hours as needed for wheezing or shortness of breath. 11/06/20   Lyndee Hensen, DO  atorvastatin (LIPITOR) 40 MG tablet TAKE 1 TABLET(40 MG) BY MOUTH DAILY Patient not taking: Reported on 09/21/2020 04/24/20   Lurline Del, DO  benzonatate (TESSALON) 200 MG capsule Take 1 capsule (200 mg total) by mouth 2 (two) times daily as needed for cough. 11/06/20   Lyndee Hensen, DO  betamethasone dipropionate 0.05 % cream Apply topically 2 (two) times daily. Hands. Do not touch your face or other sensitive areas after using the cream. Patient not taking: Reported on 09/21/2020 09/09/19   Wilber Oliphant, MD  cetirizine (ZYRTEC) 10 MG tablet TAKE 1 TABLET(10 MG) BY MOUTH DAILY 06/22/20   Lurline Del, DO  dapagliflozin propanediol (FARXIGA) 5 MG TABS tablet TAKE 1 TABLET(5 MG) BY MOUTH DAILY Patient not taking: No sig reported 05/26/20   Lurline Del, DO  diclofenac sodium (VOLTAREN) 1 % GEL Apply 4 g topically 4 (four) times daily. Patient not taking: Reported on 09/21/2020 10/07/18   Shirley, [place], DO  EPINEPHrine  0.3 mg/0.3 mL IJ SOAJ injection Inject 0.3 mLs (0.3 mg total) into the muscle as needed for anaphylaxis. Patient not taking: Reported on 09/21/2020 10/20/19   Lurline Del, DO  fluticasone (FLONASE) 50 MCG/ACT nasal spray SHAKE LIQUID AND USE 2 SPRAYS IN EACH NOSTRIL DAILY Patient taking differently: Place 2 sprays into both nostrils 2 (two) times daily as needed for allergies. 03/03/19   Shirley, [place], DO  hydrochlorothiazide (HYDRODIURIL) 25 MG tablet Take 1 tablet (25 mg total) by mouth daily. 05/26/20   Lurline Del, DO  metFORMIN (GLUCOPHAGE-XR) 500 MG 24 hr tablet TAKE 2  TABLETS(1000 MG) BY MOUTH DAILY WITH BREAKFAST 04/24/20   Vanessa [address], [place], DO  naproxen (NAPROSYN) 375 MG tablet Take 1 tablet (375 mg total) by mouth 2 (two) times daily. Patient not taking: No sig reported 05/26/18   Charlesetta Shanks, MD  pantoprazole (PROTONIX) 40 MG tablet Take 1 tablet (40 mg total) by mouth daily. 11/06/20   Brimage, Ronnette Juniper, DO  traZODone (DESYREL) 50 MG tablet TAKE 1/2 TO 1 TABLET(25 TO 50 MG) BY MOUTH AT BEDTIME AS NEEDED FOR SLEEP Patient not taking: No sig reported 02/22/20   Lurline Del, DO    Family History Family History  Problem Relation Age of Onset   Liver cancer Mother    Diabetes Mother    Stroke Father    Breast cancer Paternal Grandmother    Stomach cancer Other    Rectal cancer Other    Esophageal cancer Other    Colon polyps Other    Colon cancer Other     Social History Social History   Tobacco Use   Smoking status: Some Days    Packs/day: 0.50    Types: Cigarettes   Smokeless tobacco: Never  Vaping Use   Vaping Use: Never used  Substance Use Topics   Alcohol use: Yes    Comment: socially    Drug use: No     Allergies   Bee venom, Latex, and Sulfa antibiotics   Review of Systems Review of Systems  Constitutional:  Negative for appetite change, chills and fever.  HENT:  Negative for congestion, ear pain, rhinorrhea, sinus pressure, sinus pain and sore throat.   Eyes:  Negative for redness and visual disturbance.  Respiratory:  Positive for cough. Negative for chest tightness, shortness of breath and wheezing.   Cardiovascular:  Negative for chest pain and palpitations.  Gastrointestinal:  Positive for nausea. Negative for abdominal pain, constipation, diarrhea and vomiting.  Genitourinary:  Negative for dysuria, frequency and urgency.  Musculoskeletal:  Negative for myalgias.  Neurological:  Positive for light-headedness. Negative for dizziness, tremors, seizures, syncope, speech difficulty, weakness, numbness and headaches.   Psychiatric/Behavioral:  Negative for confusion.   All other systems reviewed and are negative.   Physical Exam Triage Vital Signs ED Triage Vitals  Enc Vitals Group     BP 11/13/20 1756 128/81     Pulse Rate 11/13/20 1756 77     Resp 11/13/20 1756 16     Temp 11/13/20 1756 98.2 F (36.8 C)     Temp Source 11/13/20 1756 Oral     SpO2 11/13/20 1756 97 %     Weight --      Height --      Head Circumference --      Peak Flow --      Pain Score 11/13/20 1755 0     Pain Loc --      Pain Edu? --  Excl. in GC? --    No data found.  Updated Vital Signs BP 128/81 (BP Location: Right Arm)   Pulse 77   Temp 98.2 F (36.8 C) (Oral)   Resp 16   SpO2 97%   Visual Acuity Right Eye Distance:   Left Eye Distance:   Bilateral Distance:    Right Eye Near:   Left Eye Near:    Bilateral Near:     Physical Exam Vitals reviewed.  Constitutional:      General: She is not in acute distress.    Appearance: Normal appearance. She is not ill-appearing.  HENT:     Head: Normocephalic and atraumatic.     Right Ear: Tympanic membrane, ear canal and external ear normal. No tenderness. No middle ear effusion. There is no impacted cerumen. Tympanic membrane is not perforated, erythematous, retracted or bulging.     Left Ear: Tympanic membrane, ear canal and external ear normal. No tenderness.  No middle ear effusion. There is no impacted cerumen. Tympanic membrane is not perforated, erythematous, retracted or bulging.     Nose: Nose normal. No congestion.     Mouth/Throat:     Mouth: Mucous membranes are moist.     Pharynx: Uvula midline. No oropharyngeal exudate or posterior oropharyngeal erythema.  Eyes:     Extraocular Movements: Extraocular movements intact.     Pupils: Pupils are equal, round, and reactive to light.  Cardiovascular:     Rate and Rhythm: Normal rate and regular rhythm.     Heart sounds: Normal heart sounds.  Pulmonary:     Effort: Pulmonary effort is normal.      Breath sounds: Normal breath sounds. No decreased breath sounds, wheezing, rhonchi or rales.  Abdominal:     Palpations: Abdomen is soft.     Tenderness: There is no abdominal tenderness. There is no guarding or rebound.     Comments: No pain to palpation  Neurological:     General: No focal deficit present.     Mental Status: She is alert and oriented to person, place, and time.     Comments: PERRLA, EOMI. CN 2-12 grossly intact. Negative rhomberg, pronator drift, fingers to thumb. Gait intact.   Psychiatric:        Mood and Affect: Mood normal.        Behavior: Behavior normal.        Thought Content: Thought content normal.        Judgment: Judgment normal.     UC Treatments / Results  Labs (all labs ordered are listed, but only abnormal results are displayed) Labs Reviewed  CBG MONITORING, ED - Abnormal; Notable for the following components:      Result Value   Glucose-Capillary 159 (*)    All other components within normal limits    EKG   Radiology No results found.  Procedures Procedures (including critical care time)  Medications Ordered in UC Medications - No data to display  Initial Impression / Assessment and Plan / UC Course  I have reviewed the triage vital signs and the nursing notes.  Pertinent labs & imaging results that were available during my care of the patient were reviewed by me and considered in my medical decision making (see chart for details).     This patient is a very pleasant 55 y.o. year old female presenting with one week of lightheadedness and nausea. Possibly related to metformin.  Reassuring neuro exam today.  Nonfasting CBG 159 EKG NSR, unchanged from  2021 EKG.   Rec decreasing metformin to '500mg'$  daily and follow-up with PCP at their earliest convenience for diabetes management.  ED return precautions discussed. Patient verbalizes understanding and agreement.    Final Clinical Impressions(s) / UC Diagnoses   Final  diagnoses:  Lightheadedness  Nausea without vomiting     Discharge Instructions      -Take the Zofran (ondansetron) up to 3 times daily for nausea and vomiting. Dissolve one pill under your tongue or between your teeth and your cheek. -Follow-up with PCP for diabetes recheck and to discuss stopping metformin -If you develop chest pain, shortness of breath, vision changes, the worst headache of your life- head straight to the ED or call 911.      ED Prescriptions     Medication Sig Dispense Auth. Provider   ondansetron (ZOFRAN ODT) 8 MG disintegrating tablet Take 1 tablet (8 mg total) by mouth every 8 (eight) hours as needed for nausea or vomiting. 20 tablet Hazel Sams, PA-C      PDMP not reviewed this encounter.   Hazel Sams, PA-C 11/13/20 [phone]

## 2020-11-13 NOTE — Telephone Encounter (Signed)
Patient calls nurse line regarding dizziness. Reports that dizziness is intermittent and is not better or worse when she changes positions. States episodes occur randomly. Onset on Saturday, 8/20.   Patient was seen last week in [facility] on 8/15. Tested negative for COVID.   Patient unable to report recent BP or blood sugar. Patient is type II diabetic and has history of HTN. Advised that patient be evaluated for dizziness in urgent care as we do not have any appointments until next Wednesday.   Patient is requesting to speak with provider regarding concern prior to going to urgent care. Informed patient that I would send message to PCP, however, I would recommend her seek evaluation, especially if symptoms worsen.    Will forward to PCP.  Talbot Grumbling, RN

## 2020-11-13 NOTE — ED Triage Notes (Signed)
Pt presents with lightheadedness and some nausea X 1 week.

## 2020-11-20 ENCOUNTER — Telehealth: Payer: Self-pay

## 2020-11-20 NOTE — Telephone Encounter (Signed)
Submitted for TriVisc, right knee online through [facility] portal.  Patient is aware that she will receive a call from [facility] to pay.

## 2020-11-23 ENCOUNTER — Telehealth: Payer: Self-pay | Admitting: Family

## 2020-11-23 NOTE — Telephone Encounter (Signed)
Pt called requesting a call back from April. She has a few questions about injection. Please call pt at [phone].

## 2020-11-23 NOTE — Telephone Encounter (Signed)
Called and left a VM for patient to return my call .
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
## 2020-12-04 ENCOUNTER — Telehealth: Payer: Self-pay

## 2020-12-04 NOTE — Telephone Encounter (Signed)
Received TriVisc injections from [facility] for right knee for patient. Purchased by patient.  Appointments have been scheduled.

## 2020-12-04 NOTE — Progress Notes (Deleted)
12/05/20- 53 yoF Smoker for sleep evaluation courtesy of Dr Dorcas Mcmurray with concern of Insomnia Medical problem list includes HTN, GERD, DM2, Hyperlipidemia, Goiter, Obesity, Anxiety,  Meds include trazodone 50,  Epworth score- Body weight today- Covid vax-

## 2020-12-05 ENCOUNTER — Institutional Professional Consult (permissible substitution): Payer: 59 | Admitting: Internal Medicine

## 2020-12-06 ENCOUNTER — Ambulatory Visit (INDEPENDENT_AMBULATORY_CARE_PROVIDER_SITE_OTHER): Payer: 59 | Admitting: Family

## 2020-12-06 ENCOUNTER — Encounter: Payer: Self-pay | Admitting: Family

## 2020-12-06 DIAGNOSIS — M1731 Unilateral post-traumatic osteoarthritis, right knee: Secondary | ICD-10-CM | POA: Diagnosis not present

## 2020-12-06 MED ORDER — SODIUM HYALURONATE (VISCOSUP) 25 MG/2.5ML IX SOSY
25.0000 mg | PREFILLED_SYRINGE | INTRA_ARTICULAR | Status: AC | PRN
Start: 2020-12-06 — End: 2020-12-06
  Administered 2020-12-06: 25 mg via INTRA_ARTICULAR

## 2020-12-06 MED ORDER — LIDOCAINE HCL 1 % IJ SOLN
1.0000 mL | INTRAMUSCULAR | Status: AC | PRN
  Administered 2020-12-06: 1 mL

## 2020-12-06 NOTE — Progress Notes (Signed)
Office Visit Note   Patient: Lisa Sullivan           Date of Birth: 1966/01/19           MRN: [id number] Visit Date: 12/06/2020              Requested by: Lurline Del, DO [address],  [place] [address] PCP: Lurline Del, DO  Chief Complaint  Patient presents with   Right Knee - Follow-up    Trivisc #1 right knee pt purchased.       HPI: The patient is a 55 year old woman who presents today for planned supplemental injection of the right knee.  Assessment & Plan: Visit Diagnoses: No diagnosis found.  Plan: TriVisc injection right knee.  Patient tolerated this well.  She was instructed on expectations.  She will use anti-inflammatories and ice for the next 2 days.  Follow-up in the office as needed  Follow-Up Instructions: Return if symptoms worsen or fail to improve.   Ortho Exam  Patient is alert, oriented, no adenopathy, well-dressed, normal affect, normal respiratory effort.   Imaging: No results found. No images are attached to the encounter.  Labs: Lab Results  Component Value Date   HGBA1C 7.2 (A) 05/26/2020   HGBA1C 7.4 (A) 03/23/2020   HGBA1C 8.0 (H) 10/06/2019   REPTSTATUS 02/26/2007 FINAL 02/24/2007   GRAMSTAIN  02/24/2007    NO WBC SEEN RARE SQUAMOUS EPITHELIAL CELLS PRESENT FEW GRAM POSITIVE COCCI IN CLUSTERS IN PAIRS   CULT  02/24/2007    MODERATE METHICILLIN RESISTANT STAPHYLOCOCCUS AUREUS Note: RIFAMPIN AND GENTAMICIN SHOULD NOT BE USED AS SINGLE DRUGS FOR TREATMENT OF STAPH INFECTIONS. Contact laboratory if Clindamycin results needed. [phone] CRITICAL RESULT CALLED TO, READ BACK BY AND VERIFIED WITH: TONY SILVIANO 02/26/07 1020 BY  SMITHERSJ   LABORGA METHICILLIN RESISTANT STAPHYLOCOCCUS AUREUS 02/24/2007     Lab Results  Component Value Date   ALBUMIN 4.1 06/21/2019   ALBUMIN 4.2 09/02/2016   ALBUMIN 4.1 08/02/2013    No results found for: MG No results found for: VD25OH  No results found for: PREALBUMIN CBC  EXTENDED Latest Ref Rng & Units 06/21/2019 03/03/2019 05/26/2018  WBC 4.0 - 10.5 K/uL 7.5 6.6 6.9  RBC 3.87 - 5.11 MIL/uL 4.93 4.99 5.12(H)  HGB 12.0 - 15.0 g/dL 13.6 13.9 13.7  HCT 36.0 - 46.0 % 44.1 42.8 45.8  PLT 150 - 400 K/uL 270 214 237  NEUTROABS 1.7 - 7.7 K/uL 3.8 - -  LYMPHSABS 0.7 - 4.0 K/uL 3.0 - -     There is no height or weight on file to calculate BMI.  Orders:  No orders of the defined types were placed in this encounter.  No orders of the defined types were placed in this encounter.    Procedures: Large Joint Inj: R knee on 12/06/2020 1:26 PM Indications: pain Details: 18 G 1.5 in needle, anteromedial approach Medications: 1 mL lidocaine 1 %; 25 mg Sodium Hyaluronate 25 MG/2.5ML Consent was given by the patient.     Clinical Data: No additional findings.  ROS:  All other systems negative, except as noted in the HPI. Review of Systems  Objective: Vital Signs: There were no vitals taken for this visit.  Specialty Comments:  No specialty comments available.  PMFS History: Patient Active Problem List   Diagnosis Date Noted   Gastroesophageal reflux disease 11/10/2020   Anxiety 05/26/2020   Hyperlipidemia associated with type 2 diabetes mellitus ([place]) 10/20/2019   Insomnia 10/06/2019  Type 2 diabetes mellitus without complications ([place]) Q000111Q   Depression 01/23/2015   Viral URI with cough 08/09/2014   Hypertension 02/28/2011   Goiter 02/28/2011   OBESITY, UNSPECIFIED 03/27/2010   SMOKER 03/27/2010   DYSHIDROTIC ECZEMA, HANDS 03/27/2010   Past Medical History:  Diagnosis Date   Diabetes mellitus without complication ([place])    Gall stone    Hyperlipidemia    Hypertension     Family History  Problem Relation Age of Onset   Liver cancer Mother    Diabetes Mother    Stroke Father    Breast cancer Paternal Grandmother    Stomach cancer Other    Rectal cancer Other    Esophageal cancer Other    Colon polyps Other    Colon cancer Other      Past Surgical History:  Procedure Laterality Date   CESAREAN SECTION     CHOLECYSTECTOMY     TUBAL LIGATION     Social History   Occupational History   Not on file  Tobacco Use   Smoking status: Some Days    Packs/day: 0.50    Types: Cigarettes   Smokeless tobacco: Never  Vaping Use   Vaping Use: Never used  Substance and Sexual Activity   Alcohol use: Yes    Comment: socially    Drug use: No   Sexual activity: Not on file

## 2020-12-08 ENCOUNTER — Ambulatory Visit: Payer: 59 | Admitting: Family

## 2020-12-15 ENCOUNTER — Ambulatory Visit (INDEPENDENT_AMBULATORY_CARE_PROVIDER_SITE_OTHER): Payer: 59 | Admitting: Family

## 2020-12-15 ENCOUNTER — Encounter: Payer: Self-pay | Admitting: Family

## 2020-12-15 ENCOUNTER — Other Ambulatory Visit: Payer: Self-pay

## 2020-12-15 DIAGNOSIS — M1711 Unilateral primary osteoarthritis, right knee: Secondary | ICD-10-CM | POA: Diagnosis not present

## 2020-12-15 MED ORDER — SODIUM HYALURONATE (VISCOSUP) 25 MG/2.5ML IX SOSY
25.0000 mg | PREFILLED_SYRINGE | INTRA_ARTICULAR | Status: AC | PRN
  Administered 2020-12-15: 25 mg via INTRA_ARTICULAR

## 2020-12-15 MED ORDER — LIDOCAINE HCL 1 % IJ SOLN
1.0000 mL | INTRAMUSCULAR | Status: AC | PRN
  Administered 2020-12-15: 1 mL

## 2020-12-15 NOTE — Progress Notes (Signed)
Office Visit Note   Patient: Lisa Sullivan           Date of Birth: Mar 08, 1966           MRN: [id number] Visit Date: 12/15/2020              Requested by: Lurline Del, DO [place],  [place] [address] PCP: Lurline Del, DO  Chief Complaint  Patient presents with   Right Knee - Follow-up    Trivisc #2 patient purchased       HPI: Lisa Sullivan presents today for planned Trimix injection right knee for osteoarthritis  Assessment & Plan: Visit Diagnoses: No diagnosis found.  Plan: Patient tolerated well she will use ibuprofen and ice for the next 2 days.  Follow-up in the office as needed.  Follow-Up Instructions: Return if symptoms worsen or fail to improve.   Ortho Exam  Patient is alert, oriented, no adenopathy, well-dressed, normal affect, normal respiratory effort.   Imaging: No results found. No images are attached to the encounter.  Labs: Lab Results  Component Value Date   HGBA1C 7.2 (A) 05/26/2020   HGBA1C 7.4 (A) 03/23/2020   HGBA1C 8.0 (H) 10/06/2019   REPTSTATUS 02/26/2007 FINAL 02/24/2007   GRAMSTAIN  02/24/2007    NO WBC SEEN RARE SQUAMOUS EPITHELIAL CELLS PRESENT FEW GRAM POSITIVE COCCI IN CLUSTERS IN PAIRS   CULT  02/24/2007    MODERATE METHICILLIN RESISTANT STAPHYLOCOCCUS AUREUS Note: RIFAMPIN AND GENTAMICIN SHOULD NOT BE USED AS SINGLE DRUGS FOR TREATMENT OF STAPH INFECTIONS. Contact laboratory if Clindamycin results needed. [phone] CRITICAL RESULT CALLED TO, READ BACK BY AND VERIFIED WITH: TONY SILVIANO 02/26/07 1020 BY  SMITHERSJ   LABORGA METHICILLIN RESISTANT STAPHYLOCOCCUS AUREUS 02/24/2007     Lab Results  Component Value Date   ALBUMIN 4.1 06/21/2019   ALBUMIN 4.2 09/02/2016   ALBUMIN 4.1 08/02/2013    No results found for: MG No results found for: VD25OH  No results found for: PREALBUMIN CBC EXTENDED Latest Ref Rng & Units 06/21/2019 03/03/2019 05/26/2018  WBC 4.0 - 10.5 K/uL 7.5 6.6 6.9  RBC 3.87 - 5.11  MIL/uL 4.93 4.99 5.12(H)  HGB 12.0 - 15.0 g/dL 13.6 13.9 13.7  HCT 36.0 - 46.0 % 44.1 42.8 45.8  PLT 150 - 400 K/uL 270 214 237  NEUTROABS 1.7 - 7.7 K/uL 3.8 - -  LYMPHSABS 0.7 - 4.0 K/uL 3.0 - -     There is no height or weight on file to calculate BMI.  Orders:  No orders of the defined types were placed in this encounter.  No orders of the defined types were placed in this encounter.    Procedures: Large Joint Inj: R knee on 12/15/2020 10:17 AM Indications: pain Details: 18 G 1.5 in needle, anteromedial approach Medications: 1 mL lidocaine 1 %; 25 mg Sodium Hyaluronate 25 MG/2.5ML Consent was given by the patient.     Clinical Data: No additional findings.  ROS:  All other systems negative, except as noted in the HPI. Review of Systems  Constitutional:  Negative for chills and fever.  Musculoskeletal:  Positive for arthralgias.   Objective: Vital Signs: There were no vitals taken for this visit.  Specialty Comments:  No specialty comments available.  PMFS History: Patient Active Problem List   Diagnosis Date Noted   Gastroesophageal reflux disease 11/10/2020   Anxiety 05/26/2020   Hyperlipidemia associated with type 2 diabetes mellitus ([place]) 10/20/2019   Insomnia 10/06/2019   Type 2 diabetes  mellitus without complications ([place]) 34/94/9447   Depression 01/23/2015   Viral URI with cough 08/09/2014   Hypertension 02/28/2011   Goiter 02/28/2011   OBESITY, UNSPECIFIED 03/27/2010   SMOKER 03/27/2010   DYSHIDROTIC ECZEMA, HANDS 03/27/2010   Past Medical History:  Diagnosis Date   Diabetes mellitus without complication ([place])    Gall stone    Hyperlipidemia    Hypertension     Family History  Problem Relation Age of Onset   Liver cancer Mother    Diabetes Mother    Stroke Father    Breast cancer Paternal Grandmother    Stomach cancer Other    Rectal cancer Other    Esophageal cancer Other    Colon polyps Other    Colon cancer Other     Past  Surgical History:  Procedure Laterality Date   CESAREAN SECTION     CHOLECYSTECTOMY     TUBAL LIGATION     Social History   Occupational History   Not on file  Tobacco Use   Smoking status: Some Days    Packs/day: 0.50    Types: Cigarettes   Smokeless tobacco: Never  Vaping Use   Vaping Use: Never used  Substance and Sexual Activity   Alcohol use: Yes    Comment: socially    Drug use: No   Sexual activity: Not on file

## 2020-12-19 ENCOUNTER — Encounter: Payer: Self-pay | Admitting: Family Medicine

## 2020-12-19 ENCOUNTER — Other Ambulatory Visit: Payer: Self-pay

## 2020-12-19 ENCOUNTER — Ambulatory Visit (INDEPENDENT_AMBULATORY_CARE_PROVIDER_SITE_OTHER): Payer: 59 | Admitting: Family Medicine

## 2020-12-19 VITALS — BP 132/87 | HR 82 | Wt 238.1 lb

## 2020-12-19 DIAGNOSIS — E785 Hyperlipidemia, unspecified: Secondary | ICD-10-CM

## 2020-12-19 DIAGNOSIS — E1169 Type 2 diabetes mellitus with other specified complication: Secondary | ICD-10-CM

## 2020-12-19 DIAGNOSIS — H6121 Impacted cerumen, right ear: Secondary | ICD-10-CM

## 2020-12-19 DIAGNOSIS — E119 Type 2 diabetes mellitus without complications: Secondary | ICD-10-CM | POA: Diagnosis not present

## 2020-12-19 DIAGNOSIS — R350 Frequency of micturition: Secondary | ICD-10-CM | POA: Diagnosis not present

## 2020-12-19 LAB — POCT URINALYSIS DIP (MANUAL ENTRY)
Bilirubin, UA: NEGATIVE
Glucose, UA: NEGATIVE mg/dL
Ketones, POC UA: NEGATIVE mg/dL
Leukocytes, UA: NEGATIVE
Nitrite, UA: NEGATIVE
Protein Ur, POC: NEGATIVE mg/dL
Spec Grav, UA: >=1.03 — AB (ref 1.010–1.025)
Urobilinogen, UA: 0.2 E.U./dL
pH, UA: 5.5 (ref 5.0–8.0)

## 2020-12-19 LAB — POCT UA - MICROSCOPIC ONLY

## 2020-12-19 LAB — POCT GLYCOSYLATED HEMOGLOBIN (HGB A1C): HbA1c, POC (controlled diabetic range): 7.6 % — AB (ref 0.0–7.0)

## 2020-12-19 NOTE — Assessment & Plan Note (Signed)
Patient endorses only taking her statin intermittently.  We will check lipid panel today.

## 2020-12-19 NOTE — Assessment & Plan Note (Signed)
A1c of 7.6 today.  Close to goal but not quite there.  Patient was not able to afford her Wilder Glade and so has stopped this.  Patient has been taking metformin 500 mg daily due to some stomach upset.  Discussed potential options for increasing diabetic control and the patient is interested in trying 1000 mg of metformin daily.  We will try this and check A1c in 3 months.

## 2020-12-19 NOTE — Progress Notes (Signed)
    SUBJECTIVE:   CHIEF COMPLAINT / HPI:   Issues hearing out of right ear: 55 year old female presenting for the above.  She states she has had trouble hearing out of the right ear for a few days and believes she has earwax packed in there..  Diabetic Follow Up: Patient is a 55 y.o. female who present today for diabetic follow up.   Patient endorses difficulties affording medications  Home medications include: Farxiga 5 mg daily, metformin 1000 mg twice daily.  She stopped taking her Wilder Glade because she could not afford it.  She states she is only taken 500 mg of the metformin daily because of GI upset.  Most recent A1Cs:  Lab Results  Component Value Date   HGBA1C 7.6 (A) 12/19/2020   HGBA1C 7.2 (A) 05/26/2020   HGBA1C 7.4 (A) 03/23/2020   Last Microalbumin, LDL, Creatinine: Lab Results  Component Value Date   MICROALBUR 10 03/03/2019   LDLCALC 101 (H) 10/06/2019   CREATININE 1.01 (H) 10/06/2019   Patient does not check blood glucose on a regular basis.  Patient is up to date on diabetic eye. Patient is up to date on diabetic foot exam.  Hyperlipidemia: Most recent cholesterol panel on 10/06/2019 with LDL of 101, HDL of 42.  Urinary frequency: Patient states that she has had increasing urinary frequency of late she has some concerns about a urinary tract infection.  She denies any dysuria.  PERTINENT  PMH / PSH: History of type 2 diabetes  OBJECTIVE:   BP 132/87   Pulse 82   Wt 238 lb 2 oz (108 kg)   SpO2 100%   BMI 40.87 kg/m    General: NAD, pleasant, able to participate in exam HEENT: Left tympanic membrane normal with canal normal, right tympanic membrane is obscured by impacted cerumen Cardiac: RRR, no murmurs. Respiratory: CTAB, normal effort, No wheezes, rales or rhonchi Abdomen: Bowel sounds present, mild discomfort in the suprapubic area but no other discomfort on palpation Psych: Normal affect and mood  ASSESSMENT/PLAN:   Hyperlipidemia  associated with type 2 diabetes mellitus ([place]) Patient endorses only taking her statin intermittently.  We will check lipid panel today.  Type 2 diabetes mellitus without complications (HCC) R1V of 7.6 today.  Close to goal but not quite there.  Patient was not able to afford her Wilder Glade and so has stopped this.  Patient has been taking metformin 500 mg daily due to some stomach upset.  Discussed potential options for increasing diabetic control and the patient is interested in trying 1000 mg of metformin daily.  We will try this and check A1c in 3 months.   Impacted cerumen: Cerumen impacted in right auditory canal.  Attempted irrigation which was successful.  Follow-up as needed.  Urinary frequency: Patient with complaints of urinary frequency.  Some mild discomfort to palpation of suprapubic area.  She denies any dysuria.  Urinalysis shows no sign of urinary tract infection.  Lisa Sullivan, Lisa Sullivan

## 2020-12-19 NOTE — Patient Instructions (Signed)
For your diabetes I would like for you to start taking an 1000 mg of metformin daily.  If you have significant stomach upset with this please let us know and we can consider other medication options but most of the time stomach discomfort improves after 3 to 4 days of using the new dose.  We are going to check your cholesterol levels today and I will let you know the results when they return.  We checked your urine today because of urinary frequency.  The urinalysis was essentially normal.  For your right ear, you have a cerumen impaction which is earwax blocking the canal.  We provided irrigation which seem to remove the cerumen.  If have any further problems let us know.

## 2020-12-20 LAB — MICROALBUMIN / CREATININE URINE RATIO
Creatinine, Urine: 178.2 mg/dL
Microalb/Creat Ratio: 3 mg/g creat (ref 0–29)
Microalbumin, Urine: 4.7 ug/mL

## 2020-12-20 LAB — LIPID PANEL
Chol/HDL Ratio: 4.9 ratio — ABNORMAL HIGH (ref 0.0–4.4)
Cholesterol, Total: 196 mg/dL (ref 100–199)
HDL: 40 mg/dL (ref >39)
LDL Chol Calc (NIH): 121 mg/dL — ABNORMAL HIGH (ref 0–99)
Triglycerides: 200 mg/dL — ABNORMAL HIGH (ref 0–149)
VLDL Cholesterol Cal: 35 mg/dL (ref 5–40)

## 2020-12-21 ENCOUNTER — Other Ambulatory Visit: Payer: Self-pay | Admitting: Family Medicine

## 2020-12-22 ENCOUNTER — Ambulatory Visit (INDEPENDENT_AMBULATORY_CARE_PROVIDER_SITE_OTHER): Payer: 59 | Admitting: Family

## 2020-12-22 ENCOUNTER — Other Ambulatory Visit: Payer: Self-pay

## 2020-12-22 DIAGNOSIS — M1731 Unilateral post-traumatic osteoarthritis, right knee: Secondary | ICD-10-CM | POA: Diagnosis not present

## 2020-12-22 MED ORDER — LIDOCAINE HCL 1 % IJ SOLN
1.0000 mL | INTRAMUSCULAR | Status: AC | PRN
  Administered 2020-12-22: 1 mL

## 2020-12-22 MED ORDER — SODIUM HYALURONATE (VISCOSUP) 25 MG/2.5ML IX SOSY
25.0000 mg | PREFILLED_SYRINGE | INTRA_ARTICULAR | Status: AC | PRN
  Administered 2020-12-22: 25 mg via INTRA_ARTICULAR

## 2020-12-22 NOTE — Progress Notes (Signed)
Office Visit Note   Patient: Lisa Sullivan           Date of Birth: Oct 20, 1965           MRN: [id number] Visit Date: 12/15/2020              Requested by: Lurline Del, DO [address],  [place] [id number] PCP: Lurline Del, DO  Chief Complaint  Patient presents with   Right Knee - Follow-up    Trivisc #2 patient purchased       HPI: Ms. Mumpower presents today for planned Trivisc injection #3, right knee for osteoarthritis  Assessment & Plan: Visit Diagnoses: No diagnosis found.  Plan: Patient tolerated well she will use ibuprofen and ice for the next 2 days.  Follow-up in the office as needed.  Follow-Up Instructions: Return if symptoms worsen or fail to improve.   Ortho Exam  Patient is alert, oriented, no adenopathy, well-dressed, normal affect, normal respiratory effort.   Imaging: No results found. No images are attached to the encounter.  Labs: Lab Results  Component Value Date   HGBA1C 7.2 (A) 05/26/2020   HGBA1C 7.4 (A) 03/23/2020   HGBA1C 8.0 (H) 10/06/2019   REPTSTATUS 02/26/2007 FINAL 02/24/2007   GRAMSTAIN  02/24/2007    NO WBC SEEN RARE SQUAMOUS EPITHELIAL CELLS PRESENT FEW GRAM POSITIVE COCCI IN CLUSTERS IN PAIRS   CULT  02/24/2007    MODERATE METHICILLIN RESISTANT STAPHYLOCOCCUS AUREUS Note: RIFAMPIN AND GENTAMICIN SHOULD NOT BE USED AS SINGLE DRUGS FOR TREATMENT OF STAPH INFECTIONS. Contact laboratory if Clindamycin results needed. [phone] CRITICAL RESULT CALLED TO, READ BACK BY AND VERIFIED WITH: TONY SILVIANO 02/26/07 1020 BY  SMITHERSJ   LABORGA METHICILLIN RESISTANT STAPHYLOCOCCUS AUREUS 02/24/2007     Lab Results  Component Value Date   ALBUMIN 4.1 06/21/2019   ALBUMIN 4.2 09/02/2016   ALBUMIN 4.1 08/02/2013    No results found for: MG No results found for: VD25OH  No results found for: PREALBUMIN CBC EXTENDED Latest Ref Rng & Units 06/21/2019 03/03/2019 05/26/2018  WBC 4.0 - 10.5 K/uL 7.5 6.6 6.9  RBC 3.87 -  5.11 MIL/uL 4.93 4.99 5.12(H)  HGB 12.0 - 15.0 g/dL 13.6 13.9 13.7  HCT 36.0 - 46.0 % 44.1 42.8 45.8  PLT 150 - 400 K/uL 270 214 237  NEUTROABS 1.7 - 7.7 K/uL 3.8 - -  LYMPHSABS 0.7 - 4.0 K/uL 3.0 - -     There is no height or weight on file to calculate BMI.  Orders:  No orders of the defined types were placed in this encounter.  No orders of the defined types were placed in this encounter.    Procedures: Large Joint Inj on 12/22/2020 10:29 AM Indications: pain Details: 18 G 1.5 in needle, anteromedial approach Medications: 1 mL lidocaine 1 %; 25 mg Sodium Hyaluronate 25 MG/2.5ML Consent was given by the patient.     Clinical Data: No additional findings.  ROS:  All other systems negative, except as noted in the HPI. Review of Systems  Constitutional:  Negative for chills and fever.  Musculoskeletal:  Positive for arthralgias.   Objective: Vital Signs: There were no vitals taken for this visit.  Specialty Comments:  No specialty comments available.  PMFS History: Patient Active Problem List   Diagnosis Date Noted   Gastroesophageal reflux disease 11/10/2020   Anxiety 05/26/2020   Hyperlipidemia associated with type 2 diabetes mellitus ([place]) 10/20/2019   Insomnia 10/06/2019   Type 2 diabetes mellitus  without complications ([place]) 46/19/0122   Depression 01/23/2015   Viral URI with cough 08/09/2014   Hypertension 02/28/2011   Goiter 02/28/2011   OBESITY, UNSPECIFIED 03/27/2010   SMOKER 03/27/2010   DYSHIDROTIC ECZEMA, HANDS 03/27/2010   Past Medical History:  Diagnosis Date   Diabetes mellitus without complication ([place])    Gall stone    Hyperlipidemia    Hypertension     Family History  Problem Relation Age of Onset   Liver cancer Mother    Diabetes Mother    Stroke Father    Breast cancer Paternal Grandmother    Stomach cancer Other    Rectal cancer Other    Esophageal cancer Other    Colon polyps Other    Colon cancer Other     Past Surgical  History:  Procedure Laterality Date   CESAREAN SECTION     CHOLECYSTECTOMY     TUBAL LIGATION     Social History   Occupational History   Not on file  Tobacco Use   Smoking status: Some Days    Packs/day: 0.50    Types: Cigarettes   Smokeless tobacco: Never  Vaping Use   Vaping Use: Never used  Substance and Sexual Activity   Alcohol use: Yes    Comment: socially    Drug use: No   Sexual activity: Not on file

## 2021-01-10 ENCOUNTER — Other Ambulatory Visit: Payer: Self-pay

## 2021-01-10 ENCOUNTER — Ambulatory Visit (INDEPENDENT_AMBULATORY_CARE_PROVIDER_SITE_OTHER): Payer: 59 | Admitting: Family Medicine

## 2021-01-10 DIAGNOSIS — R059 Cough, unspecified: Secondary | ICD-10-CM | POA: Diagnosis not present

## 2021-01-10 MED ORDER — BENZONATATE 200 MG PO CAPS: 200.0000 mg | ORAL_CAPSULE | Freq: Two times a day (BID) | ORAL | 0 refills | Status: DC | PRN

## 2021-01-10 NOTE — Progress Notes (Signed)
    SUBJECTIVE:   CHIEF COMPLAINT / HPI:   Ms. Bellizzi is a 55 yo F who presented due to 1 week of dry now wet cough. She was previously experiencing cold symptoms such as sneezing and congestion. These symptoms has since resolved but cough is still present. She is a known every day smoker and states cough gets worse at night with white to yellow mucus production. Denies fever and shortness of breath. States her chest wall is hurting on both sides from excessive coughing. Would like something for cough.   PERTINENT  PMH / PSH: HTN, Viral URI w/ cough 2 month prior, GERD  OBJECTIVE:   BP 128/80   Pulse 81   Temp 98.2 F (36.8 C)   SpO2 99%   General: Appears well, no acute distress. Age appropriate. Cardiac: RRR, normal heart sounds, no murmurs Respiratory: CTAB, normal effort Extremities: No edema or cyanosis. Neuro: alert and oriented Psych: normal affect  ASSESSMENT/PLAN:   Cough 1 week. Hx of viral URI. Known smoker. Consider make causes of etiology such as post viral cough syndrome, bronchitis, GERD. Discussed supportive care measures with patient such as honey, dayquil/nyquil, smoking cessation, and continuing protonix. Rx for tessalon perls.   - benzonatate (TESSALON) 200 MG capsule; Take 1 capsule (200 mg total) by mouth 2 (two) times daily as needed for cough.  Dispense: 20 capsule; Refill: 0   Gerlene Fee, [place]

## 2021-01-10 NOTE — Patient Instructions (Signed)
We discussed a teaspoon of honey, hot tea, nyquil/dayquil for no more than 10 days, tessalon perls. Return if symptoms fail to improve or worsens.   Dr. Janus Molder

## 2021-01-26 ENCOUNTER — Other Ambulatory Visit: Payer: Self-pay

## 2021-01-29 MED ORDER — METFORMIN HCL ER 500 MG PO TB24: ORAL_TABLET | ORAL | 1 refills | Status: DC

## 2021-04-11 ENCOUNTER — Other Ambulatory Visit: Payer: Self-pay

## 2021-04-11 ENCOUNTER — Ambulatory Visit (HOSPITAL_COMMUNITY)
Admission: RE | Admit: 2021-04-11 | Discharge: 2021-04-11 | Disposition: A | Discharge status: Home / Self Care | Payer: 59 | Source: Ambulatory Visit | Attending: Family Medicine | Admitting: Family Medicine

## 2021-04-11 ENCOUNTER — Encounter: Payer: Self-pay | Admitting: Family Medicine

## 2021-04-11 ENCOUNTER — Ambulatory Visit: Payer: 59 | Admitting: Family Medicine

## 2021-04-11 VITALS — BP 132/93 | HR 83 | Ht 64.0 in | Wt 232.2 lb

## 2021-04-11 DIAGNOSIS — R42 Dizziness and giddiness: Secondary | ICD-10-CM | POA: Diagnosis not present

## 2021-04-11 DIAGNOSIS — R040 Epistaxis: Secondary | ICD-10-CM | POA: Diagnosis not present

## 2021-04-11 DIAGNOSIS — R053 Chronic cough: Secondary | ICD-10-CM

## 2021-04-11 NOTE — Progress Notes (Signed)
° ° °  SUBJECTIVE:   CHIEF COMPLAINT / HPI:   Cough: 56 year old female presenting with cough for several months. It gets better for about two weeks and then returns. Bothers her more at night. Smoking about 1 cigarette per day. She has a history of reflux on protonix but she states her reflux seems well controlled. She endorses a bitter taste that occurs when she coughs.  She denies taking any medication such as lisinopril or losartan.  Nosebleeds: Patient states she has been having nose bleeds when she blows her nose and sneezes hard. Never has blood draining from the nose.  Lightheadedness: Happens since she has had her cough. She cannot make it occur by standing up suddenly. She thinks it happens mostly in the morning before she eats. No chest pain or shortness of breath.  PERTINENT  PMH / PSH: T2DM  OBJECTIVE:   BP (!) 132/93    Pulse 83    Ht 5\' 4"  (1.626 m)    Wt 232 lb 3.2 oz (105.3 kg)    SpO2 100%    BMI 39.86 kg/m    General: NAD, pleasant, able to participate in exam HEENT: No blood seen in the nares Cardiac: RRR, no murmurs. Respiratory: CTAB, faint lung sounds bilaterally, breathing normally on room air Extremities: No pitting edema bilaterally Skin: warm and dry, no rashes noted Neuro: alert, no obvious focal deficits Psych: Normal affect and mood  ASSESSMENT/PLAN:    Chronic cough: Has been going on for a few months.  Patient is a current smoker but scaled back about 1 cigarette/day.  She has no other sick symptoms.  She did have a couple of weeks where her cough seemed to improve and then got worse again which could suggest a viral infection as a cause of her cough.  It could also be irritant due to smoking or some other irritant.  Other possibilities include reflux/GERD as a cause or seasonal allergies.  She does endorse a bitter taste in her mouth during the cough which could suggest reflux as a cause, however on lung exam her lungs are faint sounding suggesting  possibility of COPD.  We will send for chest x-ray and PFT testing.  Lightheadedness: Only seem to occur in the morning, prior to eating.  She is not able to cause herself by standing up suddenly.  She has not had a hemoglobin checked in some time, however endorses no bleeding.  I think is reasonable to check a CBC.  She endorses no concerns about falling due to the lightheadedness and is not having any other associated symptoms such as shortness of breath or chest pain.  We will check a CBC and follow-up in 2-3 weeks.  Discussed return precautions if she develops any additional symptoms such as chest pain, shortness of breath, etc.  Nosebleeds: Only occur when blowing the nose, she does not have any significant nosebleeds and no steady bleeding when they occur, she is only seeing blood in the mucus.  Denies any trauma to the facial area.  Likely secondary to her ongoing cough and rhinorrhea.  No other concerns based off this today.  Lurline Del, [place]

## 2021-04-11 NOTE — Patient Instructions (Signed)
For your cough I am sending you across the street to [facility] to get a chest x-ray.  I should get the results of this tomorrow.  You can go in the front entrance at the [place] and tell them the doctor sent you here for an x-ray.  I also want to check CBC for your lightheadedness.  Let see back in about 2 weeks for the lightheadedness to make sure you are continue to improve.  Finally I would like to get you scheduled for pulmonary function testing with our pharmacy team.  When you go upfront please schedule this.

## 2021-04-12 ENCOUNTER — Ambulatory Visit (INDEPENDENT_AMBULATORY_CARE_PROVIDER_SITE_OTHER): Payer: 59 | Admitting: Pharmacist

## 2021-04-12 ENCOUNTER — Encounter: Payer: Self-pay | Admitting: Pharmacist

## 2021-04-12 DIAGNOSIS — F17200 Nicotine dependence, unspecified, uncomplicated: Secondary | ICD-10-CM

## 2021-04-12 LAB — CBC
Hematocrit: 41 % (ref 34.0–46.6)
Hemoglobin: 13.7 g/dL (ref 11.1–15.9)
MCH: 28 pg (ref 26.6–33.0)
MCHC: 33.4 g/dL (ref 31.5–35.7)
MCV: 84 fL (ref 79–97)
Platelets: 281 10*3/uL (ref 150–450)
RBC: 4.89 x10E6/uL (ref 3.77–5.28)
RDW: 13.7 % (ref 11.7–15.4)
WBC: 9 10*3/uL (ref 3.4–10.8)

## 2021-04-12 NOTE — Assessment & Plan Note (Signed)
Patient has been experiencing cough and increased sputum production for two years and taking albuterol PRN. Spirometry evaluation and post nebulized albuterol reveals, indicative of non-reversible obstructive lung disease. Reversibility only demonstrated with FeF 25/75.  -No medication was initiated. -Reviewed results of pulmonary function tests.  Pt verbalized understanding of results and education. -Encouraged smoking cessation.

## 2021-04-12 NOTE — Progress Notes (Signed)
° °  S:    Patient arrives ambulating independently and in good spirits.  Presents for lung function evaluation.    Patient was referred and last seen by Dr. Edwina Barth (PCP) on 04/11/2021.   Patient reports breathing has been not the best. She reports a chronic cough on and off since April 2021. Cough is worse at night time but does not interfere with sleep. Cough does not cause patient to become short of breath while walking or doing activities of daily living.  Patient smokes less than five cigarettes a day. Patient has expressed a desire to quit smoking if it would help her lungs and cough. Her partner is a former smoker. On a scale of 1 to 10, patients feels like a 6 in confidence in quitting smoking for a week.  Medication adherence reported good.  Current COPD medications: none Rescue inhaler use frequency: Patient uses albuterol (2 puffs) at least once a week. Patient exacerbation hx: none  O: Physical Exam Constitutional:      Appearance: Normal appearance.  Neurological:     Mental Status: She is alert.    Review of Systems  HENT:  Positive for nosebleeds.   Respiratory:  Positive for cough and sputum production. Negative for shortness of breath and wheezing.   All other systems reviewed and are negative.  Vitals:   04/12/21 1410  BP: (!) 142/70  Pulse: 70  SpO2: 98%    CAT score= 11 See Documentation Flowsheet - CAT/COPD for complete symptom scoring.  See "scanned report" or Documentation Flowsheet (discrete results - PFTs) for Spirometry results. Patient provided good effort while attempting spirometry.   Lung Age = 80 Albuterol Neb  Lot# [id number]     Exp. 06/22/2022  A/P: Patient has been experiencing cough and increased sputum production for two years and taking albuterol PRN. Spirometry evaluation and post nebulized albuterol reveals, indicative of non-reversible obstructive lung disease. Reversibility only demonstrated with FeF 25/75.  -No medication was  initiated. -Reviewed results of pulmonary function tests.  Pt verbalized understanding of results and education. -Encouraged smoking cessation.  Written pt instructions provided.  F/U Clinic visit with PCP.    Total time in face to face counseling 40 minutes.  Patient seen with Gala Murdoch, PharmD Candidate, and Rebbeca Paul, PharmD - PGY2 Pharmacy Resident.

## 2021-04-12 NOTE — Patient Instructions (Signed)
Nice to meet you today.  Your lung function has decreased as your lung age has been calculated as 33.  The number one thing for you to do is to quit smoking. Make sure you do some lung conditioning everyday, such as walking or swimming.  Next, if your allergies are bothering you, wash out your nostrils with nasal saline to help remove allergens. You can use Flonase to help with allergies in addition to Zyrtec. Make sure to angle the Flonase spray toward your ears to prevent irritation.  Follow up with Dr. Edwina Barth.

## 2021-04-17 ENCOUNTER — Telehealth: Payer: Self-pay

## 2021-04-17 ENCOUNTER — Other Ambulatory Visit: Payer: Self-pay | Admitting: Family Medicine

## 2021-04-17 MED ORDER — BENZONATATE 100 MG PO CAPS: 100.0000 mg | ORAL_CAPSULE | Freq: Two times a day (BID) | ORAL | 0 refills | Status: DC | PRN

## 2021-04-17 NOTE — Telephone Encounter (Signed)
Patient calls nurse line requesting "something" for her chronic cough. Patient reports this has been an ongoing issue and she has had multiple "tests" done. Patient reports she has tried Zyrtec, Flonase and Mucinex without relief. Patient reports the coughing is worse at night and she can not sleep.   Patient has an apt with PCP on 2/1.  Please advise.

## 2021-04-24 NOTE — Progress Notes (Signed)
° ° °  SUBJECTIVE:   CHIEF COMPLAINT / HPI:   Chronic cough - COPD: Patient presents for follow-up for chronic cough.  At previous appointments we have performed lab work, chest x-ray, and PFTs.  PFTs suggested a obstructive pattern that was not significantly reversible suggesting COPD.  She had previously states she smokes about 1 cigarette/day.  Current relevant medications include albuterol as needed.  Diabetic Follow Up: Patient is a 56 y.o. female who present today for diabetic follow up.   Patient endorses no problems  Home medications include: Metformin 1000BID - she is only tolerating [id number] in the morning and 500 at night due to GI side effects. Patient endorses taking these medications as prescribed.  Most recent A1Cs:  Lab Results  Component Value Date   HGBA1C 8.3 (A) 04/25/2021   HGBA1C 7.6 (A) 12/19/2020   HGBA1C 7.2 (A) 05/26/2020   Last Microalbumin, LDL, Creatinine: Lab Results  Component Value Date   MICROALBUR 10 03/03/2019   LDLCALC 121 (H) 12/19/2020   CREATININE 1.01 (H) 10/06/2019   Patient does not check blood glucose on a regular basis.  Patient is up to date on diabetic eye. Patient is up to date on diabetic foot exam.   PERTINENT  PMH / PSH: T2DM  OBJECTIVE:   BP 130/74    Pulse 94    Ht 5\' 4"  (1.626 m)    Wt 236 lb 8 oz (107.3 kg)    SpO2 98%    BMI 40.60 kg/m    General: NAD, pleasant, able to participate in exam Cardiac: RRR, no murmurs. Respiratory: CTAB, normal effort Psych: Normal affect and mood  ASSESSMENT/PLAN:   Type 2 diabetes mellitus without complications (HCC) T5V worsened from 7.6-8.3.  Patient continues on metformin taken around 500 twice daily-1000 in the morning 500 in the evening, she has been unable to tolerate higher doses due to GI side effects.  Discussed other treatment options including Ozempic.  I did demonstrate the use of this in the office today and the patient is interested in starting it.  Discussed side  effects and she denies any family history of medullary thyroid cancer.  We will prescribe Ozempic as a trial.  She is going to follow back up with me in about 1 month to see how she is doing.   COPD: Diagnosed via PFTs at last visit.  Currently smoking about 1 cigarette/day.  Patient is at high risk for exacerbations and has had a few bouts of pneumonia in the past couple of years, due to this we will start the patient on a LABA-LAMA.  I have prescribed this.  Discussed quitting smoking.  Follow-up in about a month to see how she is doing.  Lurline Del, [place]

## 2021-04-25 ENCOUNTER — Other Ambulatory Visit: Payer: Self-pay

## 2021-04-25 ENCOUNTER — Ambulatory Visit: Payer: 59 | Admitting: Family Medicine

## 2021-04-25 ENCOUNTER — Encounter: Payer: Self-pay | Admitting: Family Medicine

## 2021-04-25 VITALS — BP 130/74 | HR 94 | Ht 64.0 in | Wt 236.5 lb

## 2021-04-25 DIAGNOSIS — J449 Chronic obstructive pulmonary disease, unspecified: Secondary | ICD-10-CM | POA: Diagnosis not present

## 2021-04-25 DIAGNOSIS — E119 Type 2 diabetes mellitus without complications: Secondary | ICD-10-CM | POA: Diagnosis not present

## 2021-04-25 LAB — POCT GLYCOSYLATED HEMOGLOBIN (HGB A1C): HbA1c, POC (controlled diabetic range): 8.3 % — AB (ref 0.0–7.0)

## 2021-04-25 MED ORDER — SEMAGLUTIDE(0.25 OR 0.5MG/DOS) 2 MG/1.5ML ~~LOC~~ SOPN: 0.2500 mg | PEN_INJECTOR | SUBCUTANEOUS | 3 refills | Status: DC

## 2021-04-25 MED ORDER — ANORO ELLIPTA 62.5-25 MCG/ACT IN AEPB: 1.0000 | INHALATION_SPRAY | Freq: Every day | RESPIRATORY_TRACT | 2 refills | Status: AC

## 2021-04-25 NOTE — Patient Instructions (Signed)
Today we prescribed an inhaler medication for you to use each day for your COPD.  Give this a few days to kick in and it should help with your chronic cough.  If you have any difficulties affording this medication please let me know.  Prediabetes your A1c worsened from 7.6-8.3.  When she can continue taking your metformin as you have been doing but we are also adding a once weekly injectable medication called Ozempic.  I sent this to your pharmacy.  Let me know if you have any difficulties picking this up or affording this.  I might see back in about a month to see how you are doing.

## 2021-04-25 NOTE — Assessment & Plan Note (Addendum)
A1c worsened from 7.6-8.3.  Patient continues on metformin taken around 500 twice daily-1000 in the morning 500 in the evening, she has been unable to tolerate higher doses due to GI side effects.  Discussed other treatment options including Ozempic.  I did demonstrate the use of this in the office today and the patient is interested in starting it.  Discussed side effects and she denies any family history of medullary thyroid cancer.  We will prescribe Ozempic as a trial.  She is going to follow back up with me in about 1 month to see how she is doing.

## 2021-04-28 ENCOUNTER — Emergency Department (HOSPITAL_BASED_OUTPATIENT_CLINIC_OR_DEPARTMENT_OTHER)
Admission: EM | Admit: 2021-04-28 | Discharge: 2021-04-28 | Disposition: A | Discharge status: Home / Self Care | Payer: 59 | Attending: Emergency Medicine | Admitting: Emergency Medicine

## 2021-04-28 ENCOUNTER — Other Ambulatory Visit: Payer: Self-pay

## 2021-04-28 ENCOUNTER — Encounter (HOSPITAL_BASED_OUTPATIENT_CLINIC_OR_DEPARTMENT_OTHER): Payer: Self-pay

## 2021-04-28 ENCOUNTER — Emergency Department (HOSPITAL_BASED_OUTPATIENT_CLINIC_OR_DEPARTMENT_OTHER): Payer: 59

## 2021-04-28 DIAGNOSIS — J449 Chronic obstructive pulmonary disease, unspecified: Secondary | ICD-10-CM | POA: Diagnosis not present

## 2021-04-28 DIAGNOSIS — Z7984 Long term (current) use of oral hypoglycemic drugs: Secondary | ICD-10-CM | POA: Diagnosis not present

## 2021-04-28 DIAGNOSIS — R059 Cough, unspecified: Secondary | ICD-10-CM | POA: Diagnosis present

## 2021-04-28 DIAGNOSIS — I10 Essential (primary) hypertension: Secondary | ICD-10-CM | POA: Insufficient documentation

## 2021-04-28 DIAGNOSIS — Z79899 Other long term (current) drug therapy: Secondary | ICD-10-CM | POA: Insufficient documentation

## 2021-04-28 DIAGNOSIS — J988 Other specified respiratory disorders: Secondary | ICD-10-CM | POA: Insufficient documentation

## 2021-04-28 DIAGNOSIS — Z91040 Latex allergy status: Secondary | ICD-10-CM | POA: Insufficient documentation

## 2021-04-28 DIAGNOSIS — Z20822 Contact with and (suspected) exposure to covid-19: Secondary | ICD-10-CM | POA: Insufficient documentation

## 2021-04-28 LAB — RESP PANEL BY RT-PCR (FLU A&B, COVID) ARPGX2
Influenza A by PCR: NEGATIVE
Influenza B by PCR: NEGATIVE
SARS Coronavirus 2 by RT PCR: NEGATIVE

## 2021-04-28 MED ORDER — IPRATROPIUM-ALBUTEROL 0.5-2.5 (3) MG/3ML IN SOLN
3.0000 mL | Freq: Once | RESPIRATORY_TRACT | Status: AC
  Administered 2021-04-28: 3 mL via RESPIRATORY_TRACT
  Filled 2021-04-28: qty 3

## 2021-04-28 MED ORDER — FLUTICASONE PROPIONATE 50 MCG/ACT NA SUSP: NASAL | 3 refills | Status: AC

## 2021-04-28 MED ORDER — BENZONATATE 100 MG PO CAPS: 100.0000 mg | ORAL_CAPSULE | Freq: Two times a day (BID) | ORAL | 0 refills | Status: DC | PRN

## 2021-04-28 MED ORDER — DOXYCYCLINE HYCLATE 100 MG PO CAPS: 100.0000 mg | ORAL_CAPSULE | Freq: Two times a day (BID) | ORAL | 0 refills | Status: DC

## 2021-04-28 NOTE — Discharge Instructions (Addendum)
You were seen here today for evaluation of your cough and cold. Your chest xray was clear and you tested negative for COVID and flu.  I think this is likely exacerbation of your COPD as events passing the symptoms for quite a while.  I have prescribed you an antibiotic called doxycycline to take twice a day for the next week.  It is vitally important that you adhere to this medication and take as prescribed and complete the entirety of the course to prevent reinfection.  Additionally, I have sent in refills of your Tessalon Perles and Flonase to use as prescribed.  I recommend using your albuterol inhaler to help with your cough.  If you have any chest pain or worsening shortness of breath, please return to the nearest emergency department for evaluation.

## 2021-04-28 NOTE — ED Triage Notes (Signed)
Pt c/o flu like symptoms X3 days. Productive cough, stuffy nose, fever, chills body aches, N,V. VSS, NAD in triage.

## 2021-04-28 NOTE — ED Provider Notes (Signed)
[facility] Provider Note   CSN: [id number] Arrival date & time: 04/28/21  1136     History Chief Complaint  Patient presents with   Cough    Lisa Sullivan is a 56 y.o. female with h/o DM, COPD, HTN, HLD presents to the ED for evaluation of runny nose, nasal congestion, productive cough, nausea, postnasal drip, and posttussive emesis x1 for the past 2 days.  She denies any chest pain, shortness of breath, abdominal pain, lightheadedness, fever, chills, body aches.  Patient reports that she has a chronic cough and has been evaluated for this before.  She was recently put on Ellipta for COPD.  The patient denies that she has diabetes or COPD.  She denies using her albuterol inhaler.  She is unsure of what daily medications she takes.  Daily smoker.  Allergic to sulfa.   Cough Associated symptoms: rhinorrhea   Associated symptoms: no chest pain, no chills, no ear pain, no fever, no myalgias, no shortness of breath and no sore throat       Home Medications Prior to Admission medications   Medication Sig Start Date End Date Taking? Authorizing Provider  acetaminophen (TYLENOL) 650 MG CR tablet Take 650 mg by mouth every 8 (eight) hours as needed for pain.    [provider]  albuterol (VENTOLIN HFA) 108 (90 Base) MCG/ACT inhaler Inhale 2 puffs into the lungs every 6 (six) hours as needed for wheezing or shortness of breath. 11/06/20   Lyndee Hensen, DO  atorvastatin (LIPITOR) 40 MG tablet TAKE 1 TABLET(40 MG) BY MOUTH DAILY Patient not taking: Reported on 09/21/2020 04/24/20   Lurline Del, DO  benzonatate (TESSALON) 100 MG capsule Take 1 capsule (100 mg total) by mouth 2 (two) times daily as needed for cough. 04/17/21   Lurline Del, DO  betamethasone dipropionate 0.05 % cream Apply topically 2 (two) times daily. Hands. Do not touch your face or other sensitive areas after using the cream. 09/09/19   Wilber Oliphant, MD  cetirizine (ZYRTEC) 10 MG  tablet TAKE 1 TABLET(10 MG) BY MOUTH DAILY 06/22/20   Lurline Del, DO  diclofenac sodium (VOLTAREN) 1 % GEL Apply 4 g topically 4 (four) times daily. 10/07/18   Shirley, [place], DO  EPINEPHrine 0.3 mg/0.3 mL IJ SOAJ injection Inject 0.3 mLs (0.3 mg total) into the muscle as needed for anaphylaxis. Patient not taking: Reported on 09/21/2020 10/20/19   Lurline Del, DO  fluticasone (FLONASE) 50 MCG/ACT nasal spray SHAKE LIQUID AND USE 2 SPRAYS IN EACH NOSTRIL DAILY Patient taking differently: Place 1 spray into both nostrils 2 (two) times daily as needed for allergies. 03/03/19   Shirley, [place], DO  hydrochlorothiazide (HYDRODIURIL) 25 MG tablet Take 1 tablet (25 mg total) by mouth daily. 05/26/20   Lurline Del, DO  metFORMIN (GLUCOPHAGE-XR) 500 MG 24 hr tablet TAKE 2 TABLETS(1000 MG) BY MOUTH DAILY WITH BREAKFAST 01/29/21   Vanessa Jansen, Ryan, DO  ondansetron (ZOFRAN ODT) 8 MG disintegrating tablet Take 1 tablet (8 mg total) by mouth every 8 (eight) hours as needed for nausea or vomiting. 11/13/20   Hazel Sams, PA-C  pantoprazole (PROTONIX) 40 MG tablet Take 1 tablet (40 mg total) by mouth daily. 11/06/20   Lyndee Hensen, DO  Semaglutide,0.25 or 0.5MG /DOS, 2 MG/1.5ML SOPN Inject 0.25 mg into the skin once a week. 0.25 mg once weekly for 4 weeks then increase to 0.5 mg weekly for at least 4 weeks,max 1 mg 04/25/21   Lurline Del,  DO  umeclidinium-vilanterol (ANORO ELLIPTA) 62.5-25 MCG/ACT AEPB Inhale 1 puff into the lungs daily. 04/25/21   Lurline Del, DO      Allergies    Bee venom, Sulfa antibiotics, and Latex    Review of Systems   Review of Systems  Constitutional:  Negative for chills and fever.  HENT:  Positive for congestion and rhinorrhea. Negative for ear pain and sore throat.   Respiratory:  Positive for cough. Negative for shortness of breath.   Cardiovascular:  Negative for chest pain.  Gastrointestinal:  Positive for nausea and vomiting. Negative for abdominal pain, constipation and  diarrhea.  Musculoskeletal:  Negative for back pain and myalgias.  Neurological:  Negative for light-headedness.   Physical Exam Updated Vital Signs BP 127/83    Pulse 68    Temp 98.4 F (36.9 C) (Oral)    Resp 16    Ht 5\' 4"  (1.626 m)    Wt 105.2 kg    SpO2 100%    BMI 39.82 kg/m  Physical Exam Vitals and nursing note reviewed.  Constitutional:      General: She is not in acute distress.    Appearance: Normal appearance. She is not toxic-appearing.  HENT:     Head: Normocephalic and atraumatic.     Right Ear: Tympanic membrane, ear canal and external ear normal.     Left Ear: Tympanic membrane, ear canal and external ear normal.     Nose:     Comments: Bilateral nasal turbinate edema and erythema with scant clear nasal discharge.    Mouth/Throat:     Mouth: Mucous membranes are moist.     Comments: No pharyngeal erythema, exudate, or edema noted.  Uvula midline.  Airway patent.  Moist mucous membranes. Eyes:     General: No scleral icterus.    Conjunctiva/sclera: Conjunctivae normal.  Cardiovascular:     Rate and Rhythm: Normal rate and regular rhythm.  Pulmonary:     Effort: Pulmonary effort is normal. No respiratory distress.     Comments: Slightly diminished breath sounds at the bases. No respiratory distress, accessory muscle use, tripoding, nasal flaring, or cyanosis present. The patient is speaking in full sentences. The patient is satting 100% on room air.  Abdominal:     General: Bowel sounds are normal.     Palpations: Abdomen is soft.     Tenderness: There is no abdominal tenderness. There is no guarding or rebound.  Musculoskeletal:        General: No deformity.     Cervical back: Normal range of motion.     Right lower leg: No edema.     Left lower leg: No edema.  Skin:    General: Skin is warm and dry.     Findings: No rash.  Neurological:     General: No focal deficit present.     Mental Status: She is alert. Mental status is at baseline.    ED Results /  Procedures / Treatments   Labs (all labs ordered are listed, but only abnormal results are displayed) Labs Reviewed  RESP PANEL BY RT-PCR (FLU A&B, COVID) [id number]    EKG None  Radiology DG Chest 2 View  Result Date: 04/28/2021 CLINICAL DATA:  Cough EXAM: CHEST - 2 VIEW COMPARISON:  04/11/2021 FINDINGS: The heart size and mediastinal contours are within normal limits. Both lungs are clear. The visualized skeletal structures are unremarkable. IMPRESSION: No active cardiopulmonary disease. Electronically Signed   By: Elmer Picker M.D.   On:  04/28/2021 13:01    Procedures Procedures   Medications Ordered in ED Medications  ipratropium-albuterol (DUONEB) 0.5-2.5 (3) MG/3ML nebulizer solution 3 mL (3 mLs Nebulization Given 04/28/21 1325)    ED Course/ Medical Decision Making/ A&P                           Medical Decision Making Amount and/or Complexity of Data Reviewed Radiology: ordered.  Risk Prescription drug management.   56 year old female with history of COPD presents the emergency department for evaluation of cough and cold symptoms the past 2 days.  Denies any chest pain or shortness of breath.  Differential diagnosis includes but is not limited to COVID, flu, viral illness, pneumonia, COPD exacerbation, seasonal allergies.  Vital signs unremarkable.  Patient is normotensive, afebrile, normal pulse rate, satting 100% on room air without increased respiratory effort.  Speaking in full sentences with ease.  On physical exam, she does have some bilateral nasal turbinate edema with erythema and scant clear nasal discharge.  There is no pharyngeal erythema, edema, or exudate noted.  Patient has slightly diminished lung sounds at the bases.  No bilateral leg edema.  Respiratory panel ordered in triage.  Will order DuoNeb.  I independently reviewed and interpreted the patient's labs and imaging.  I agree with radiologist interpretation.  COVID and flu negative.  Chest x-ray shows  no evidence of pneumonia, pneumothorax, or any active cardiopulmonary process.  On reevaluation, the patient reports she does feel better after the DuoNeb.  I discussed with her that this is likely a viral illness and recommended that she stick with her daily medications of her albuterol inhaler and new inhaler given by her PCP.  I also prescribed her Tessalon Perles, and refilled her Flonase.  Prescribed her doxycycline for COPD exacerbation given the fact that she is a smoker with diagnosed COPD and experiencing these cough and cold symptoms make her at higher risk for COPD exacerbation and pneumonia.    At this time, the patient is safe for discharge.  I had a long discussion with the patient that she needs to adhere to these medications that she is already been prescribed for longer than a day in order to see true effect.  I recommended that she get in chronic with her PCP and share her thoughts on her new inhaler and to also be reevaluated for these cough and cold symptoms.  Strict return precautions discussed with the patient.  Patient agrees to plan.  Patient is stable and being discharged home in good condition.  Final Clinical Impression(s) / ED Diagnoses Final diagnoses:  Respiratory infection    Rx / DC Orders ED Discharge Orders          Ordered    doxycycline (VIBRAMYCIN) 100 MG capsule  2 times daily        04/28/21 1403    benzonatate (TESSALON) 100 MG capsule  2 times daily PRN        04/28/21 1403    fluticasone (FLONASE) 50 MCG/ACT nasal spray        04/28/21 1403              Sherrell Puller, PA-C 04/30/21 [id number]    Malvin Johns, MD 05/03/21 [phone]

## 2021-04-28 NOTE — ED Notes (Signed)
Assumed care from [place]. Patient laying quietly on gurney. No acute distress noted. Patient speaking in complete sentences. Clear lung sounds noted. Patient updated on plan of care. Will continue to monitor.

## 2021-04-30 ENCOUNTER — Other Ambulatory Visit: Payer: Self-pay | Admitting: Family Medicine

## 2021-04-30 ENCOUNTER — Telehealth: Payer: Self-pay

## 2021-04-30 MED ORDER — DEXTROMETHORPHAN HBR 15 MG/5ML PO SYRP: 10.0000 mL | ORAL_SOLUTION | Freq: Four times a day (QID) | ORAL | 0 refills | Status: DC | PRN

## 2021-04-30 NOTE — Telephone Encounter (Signed)
Patient has been informed and appreciative.

## 2021-04-30 NOTE — Telephone Encounter (Signed)
Patient calls nurse line reporting respiratory infection. Patient reports she was seen on 2/1 and given an inhaler for COPD, however patient reports her symptoms have continued to get worse. Patient reports she went to the ED on 2/4 and was given tessalon pearls and doxycycline. Patient reports chest congestion, chills and headaches. Patient reports she was negative for covid and flu. Patient denies fevers or SOB. Patient was speaking in full sentences, however sounded hoarse.   Patient reports she has been using the tessalon pearls, inhalers, mucinex, dayquil/nyquil and "nothing is working." Patient is requesting a cough syrup to help with coughing spells.   Patient declined an apt at this time and states, "I was there last week."   Will forward to PCP for advisement.

## 2021-06-11 NOTE — Progress Notes (Signed)
t ? ? ?SUBJECTIVE:  ? ?CHIEF COMPLAINT / HPI:  ? ?Vaginal irritation: ?Patient is a 56 y.o. female presenting with vaginal irritation for several days.  She states she is afraid she might have a yeast infection.  She does not wish to have STI testing at this time ? ?Abdominal discomfort: ?She increased her metformin from '500mg'$  in the morning and '500mg'$  at night to '1000mg'$  in the morning and 1000 at night. No blood in stool but she has had some soft stools.  No chest pains or trouble breathing. Patient recently started Ozempic 3 days ago.  She had a colonoscopy last year with follow-up recommended in 7 years.  She states that she is always felt some level of nausea and abdominal discomfort on the metformin but tries to tolerate it, however she recently increased it to thousand twice daily and this has been more significant. ? ?PERTINENT  PMH / PSH: Type 2 diabetes ? ?OBJECTIVE:  ? ?BP 122/82   Pulse 74   Ht '5\' 4"'$  (1.626 m)   Wt 233 lb 9.6 oz (106 kg)   SpO2 99%   BMI 40.10 kg/m?   ? ?General: NAD, pleasant, able to participate in exam ?Respiratory: Normal effort, no obvious respiratory distress ?Abdomen: Only mild discomfort generally with no acute surgical findings/peritoneal signs. ?Pelvic: VULVA: normal appearing vulva with no masses, tenderness or lesions, VAGINA: Normal appearing vagina with normal color, no lesions, with scant and white discharge present, Chaperone Alexis present for pelvic exam ? ?ASSESSMENT/PLAN:  ? ?  ?Abdominal discomfort: ?Has been present since the patient increased her metformin from 500 mg twice daily to 1000 twice daily.  She states she is always had some level of abdominal discomfort and nausea while on the metformin but this is been more significant since increasing it.  She also takes Ozempic but she only started that 3 days ago and her abdominal discomfort and nausea has preceded it.  On physical exam she only has mild discomfort with no acute peritoneal findings.  I do not  believe she needs imaging at this time.  I have low suspicion for urinary tract infection, intra-abdominal infection, she has had a recent normal colonoscopy, and since her symptoms match her previous symptoms on metformin I think this is most likely the etiology.  Recommend increasing the metformin to 500 mg twice daily.  I will give her some Zofran for the next couple of days.  She is going to come back in 1 week to see how she is doing.  At that time we can consider discontinuing the metformin if it persist versus changing the Ozempic or making other adjustments.  I did discuss return precautions with her. ? ?Vaginal irritation:  ?56 y.o. female with vaginal irritation for a few days.  Physical exam shows scant white discharge.  Wet prep performed is normal.  Discussed with her and that there is no indication for treatment at this time.  She is going to follow-up as needed. ? ?Lurline Del, DO ?[place]  ?

## 2021-06-12 ENCOUNTER — Ambulatory Visit (INDEPENDENT_AMBULATORY_CARE_PROVIDER_SITE_OTHER): Payer: 59 | Admitting: Family Medicine

## 2021-06-12 ENCOUNTER — Other Ambulatory Visit: Payer: Self-pay

## 2021-06-12 ENCOUNTER — Encounter: Payer: Self-pay | Admitting: Family Medicine

## 2021-06-12 VITALS — BP 122/82 | HR 74 | Ht 64.0 in | Wt 233.6 lb

## 2021-06-12 DIAGNOSIS — R109 Unspecified abdominal pain: Secondary | ICD-10-CM | POA: Diagnosis not present

## 2021-06-12 DIAGNOSIS — N898 Other specified noninflammatory disorders of vagina: Secondary | ICD-10-CM | POA: Diagnosis not present

## 2021-06-12 LAB — POCT WET PREP (WET MOUNT)
Clue Cells Wet Prep Whiff POC: NEGATIVE
Trichomonas Wet Prep HPF POC: ABSENT

## 2021-06-12 MED ORDER — ONDANSETRON HCL 4 MG PO TABS: 4.0000 mg | ORAL_TABLET | Freq: Three times a day (TID) | ORAL | 0 refills | Status: AC | PRN

## 2021-06-12 NOTE — Patient Instructions (Addendum)
Your wet prep did not show any signs of yeast infection.  We do not need to do any treatment for this at this time but if your symptoms change please let me know. ? ?For your abdominal discomfort I want you to reduce the metformin to 500 mg twice daily..  I will see you back in 1 week.  If you continue to have nausea at that time we can discuss other changes.  If you have worsening nausea, vomiting, worsening abdominal pain when she go to the ER.  I have prescribed some nausea medicine for you to use for the next few days. ?

## 2021-06-19 ENCOUNTER — Ambulatory Visit: Payer: 59 | Admitting: Family Medicine

## 2021-06-19 NOTE — Progress Notes (Deleted)
? ? ?  SUBJECTIVE:  ? ?CHIEF COMPLAINT / HPI:  ? ?Abdominal discomfort-follow-up: ?Patient last saw me on 06/12/2021 with abdominal discomfort in the setting of metformin.  She endorsed that previously higher doses of metformin she had similar abdominal discomfort and nausea.  Prior to this bout of abdominal discomfort nausea she had increased her metformin to 1000 mg twice daily.  At that appointment she had benign abdominal exam and so we will recommend to decrease metformin to 500 mg twice daily.  She had also initiated Ozempic but this was after the abdominal discomfort had began and so recommended continuing this.  Today she states***. ? ?PERTINENT  PMH / PSH: *** ? ?OBJECTIVE:  ? ?There were no vitals taken for this visit. *** ? ?General: NAD, pleasant, able to participate in exam ?Cardiac: RRR, no murmurs. ?Respiratory: CTAB, normal effort, No wheezes, rales or rhonchi ?Abdomen: *** ?Extremities: no edema or cyanosis. ?Skin: warm and dry, no rashes noted ?Neuro: alert, no obvious focal deficits ?Psych: Normal affect and mood ? ?ASSESSMENT/PLAN:  ? ?No problem-specific Assessment & Plan notes found for this encounter. ?  ? ? ?Lurline Del, DO ?[place]  ? ? ?{    This will disappear when note is signed, click to select method of visit    :1} ?

## 2021-06-19 NOTE — Progress Notes (Signed)
? ? ?  SUBJECTIVE:  ? ?CHIEF COMPLAINT / HPI:  ? ?Abdominal discomfort-follow-up: ?Patient last saw me on 06/12/2021 with abdominal discomfort in the setting of metformin.  She endorsed that previously higher doses of metformin she had similar abdominal discomfort and nausea.  Prior to this bout of abdominal discomfort nausea she had increased her metformin to 1000 mg twice daily.  At that appointment she had benign abdominal exam and so we will recommend to decrease metformin to 500 mg twice daily.  She had also initiated Ozempic but this was after the abdominal discomfort had began and so recommended continuing this.  Today she states she still has some nausea and abdominal discomfort after eating that feels like gas pains followed by diarrhea. She has diarrhea twice per day and states it has been this way since starting metformin.  She denies any dysuria.  Denies suprapubic discomfort.  Does not have any known blood in stool.  Denies fevers. ? ?PERTINENT  PMH / PSH: T2DM ? ?OBJECTIVE:  ? ?BP 133/84   Pulse 80   Ht '5\' 4"'$  (1.626 m)   Wt 227 lb 3.2 oz (103.1 kg)   SpO2 100%   BMI 39.00 kg/m?   ? ?General: NAD, pleasant, able to participate in exam ?Cardiac: RRR, no murmurs. ?Respiratory: CTAB, normal effort, No wheezes, rales or rhonchi ?Abdomen: Benign abdominal exam, bowel sounds present, nontender to palpation, no suprapubic discomfort ?Extremities: no edema or cyanosis. ?Psych: Normal affect and mood ? ?ASSESSMENT/PLAN:  ? ? ?Abdominal discomfort  nausea  diarrhea: ?Has been present since patient initiated metformin.  We previously tried to increase the dosage but she had a worsening of her symptoms.  When we reduced the dosage she continues to have symptoms similar to on the same dose prior to increasing.  She has no known blood in her stool, no fevers, no vomiting.  She experiences about 2 bouts of diarrhea per day since starting the metformin.  We recently started Ozempic but her symptoms preceded this.   Discussed with her that even though her A1c had recently worsened I think it is reasonable to stop metformin completely to see if this resolves her symptoms.  If it does then we should discuss other options such as SGLT2 inhibitors and continue to uptitrate her Ozempic.  If it does not we need further investigation for her abdominal discomfort, nausea, diarrhea.  Exam is reassuring today and I will follow-up with her in 2 weeks to see if her symptoms has improved.  No concern for UTI with no suprapubic discomfort and no dysuria.  Low concern for intra-abdominal infectious cause of her symptoms given the history and timeline with the metformin initiation.  I will check a BMP as it is time to do so for her type 2 diabetes. ? ?Lurline Del, DO ?[place]  ? ? ? ?

## 2021-06-21 ENCOUNTER — Ambulatory Visit (INDEPENDENT_AMBULATORY_CARE_PROVIDER_SITE_OTHER): Payer: 59 | Admitting: Family Medicine

## 2021-06-21 VITALS — BP 133/84 | HR 80 | Ht 64.0 in | Wt 227.2 lb

## 2021-06-21 DIAGNOSIS — R109 Unspecified abdominal pain: Secondary | ICD-10-CM | POA: Diagnosis not present

## 2021-06-21 DIAGNOSIS — R197 Diarrhea, unspecified: Secondary | ICD-10-CM | POA: Diagnosis not present

## 2021-06-21 DIAGNOSIS — E119 Type 2 diabetes mellitus without complications: Secondary | ICD-10-CM

## 2021-06-21 NOTE — Patient Instructions (Signed)
I would like for you to stop your metformin for the time being to see if your abdominal discomfort improves.  If you get any fevers, worsening abdominal pain, vomiting, or blood in your bowel movements you should follow-up with me.  I would like to see you back in 2 weeks to see if your abdominal discomfort has improved.  ? ?Continue taking your Ozempic and when it gets time to increase the dose you can increase the dose if your abdominal discomfort has improved.  At the next appointment we will talk about alternative medicines we can use for your diabetes according to if your abdominal discomfort has improved or not. ?

## 2021-06-22 ENCOUNTER — Encounter: Payer: Self-pay | Admitting: Family Medicine

## 2021-06-22 LAB — BASIC METABOLIC PANEL
BUN/Creatinine Ratio: 17 (ref 9–23)
BUN: 17 mg/dL (ref 6–24)
CO2: 19 mmol/L — ABNORMAL LOW (ref 20–29)
Calcium: 9.8 mg/dL (ref 8.7–10.2)
Chloride: 99 mmol/L (ref 96–106)
Creatinine, Ser: 1.02 mg/dL — ABNORMAL HIGH (ref 0.57–1.00)
Glucose: 110 mg/dL — ABNORMAL HIGH (ref 70–99)
Potassium: 3.9 mmol/L (ref 3.5–5.2)
Sodium: 141 mmol/L (ref 134–144)
eGFR: 64 mL/min/{1.73_m2} (ref >59)

## 2021-07-19 ENCOUNTER — Telehealth: Payer: Self-pay | Admitting: Family

## 2021-07-19 NOTE — Telephone Encounter (Signed)
Scheduled

## 2021-07-19 NOTE — Telephone Encounter (Signed)
Can you please call. Pt can have injection last office visit was 11/2020. Can you make appt with Erin or Dr. Sharol Given next available time spot please?  ?

## 2021-07-19 NOTE — Telephone Encounter (Signed)
Pt is calling she is in a lot of pain, couldn't go to work today --She is calling to see if its time for another shot. ?And it there is an earlier appt than next week. ? ?She is also getting married next month ?

## 2021-07-27 ENCOUNTER — Encounter: Payer: Self-pay | Admitting: Family

## 2021-07-27 ENCOUNTER — Ambulatory Visit (INDEPENDENT_AMBULATORY_CARE_PROVIDER_SITE_OTHER): Payer: 59 | Admitting: Family

## 2021-07-27 DIAGNOSIS — M1711 Unilateral primary osteoarthritis, right knee: Secondary | ICD-10-CM | POA: Diagnosis not present

## 2021-07-27 MED ORDER — METHYLPREDNISOLONE ACETATE 40 MG/ML IJ SUSP
40.0000 mg | INTRAMUSCULAR | Status: AC | PRN
  Administered 2021-07-27: 40 mg via INTRA_ARTICULAR

## 2021-07-27 MED ORDER — LIDOCAINE HCL 1 % IJ SOLN
5.0000 mL | INTRAMUSCULAR | Status: AC | PRN
  Administered 2021-07-27: 5 mL

## 2021-07-27 NOTE — Progress Notes (Signed)
? ?Office Visit Note ?  ?Patient: Lisa Sullivan           ?Date of Birth: Aug 02, 1965           ?MRN: [id number] ?Visit Date: 07/27/2021 ?             ?Requested by: Lurline Del, DO ?[facility] [address] ?[place],   [address] ?PCP: Lurline Del, DO ? ?Chief Complaint  ?Patient presents with  ?? Right Knee - Pain  ? ? ? ? ?HPI: ?Patient is a 56 year old woman who presents today in follow-up for right knee pain.  She has previously had supplemental injections for osteoarthritis.  Today she is requesting cortisone injection ? ?She has had gradual worsening significant medial joint line pain very mild swelling she has some popping and locking no giving way ? ?Assessment & Plan: ?Visit Diagnoses: No diagnosis found. ? ?Plan: We will proceed with Depo-Medrol injection.  Patient tolerated well.  She will follow-up in the office as needed ? ?Follow-Up Instructions: Return if symptoms worsen or fail to improve.  ? ?Right Knee Exam  ? ?Muscle Strength  ?The patient has normal right knee strength. ? ?Tenderness  ?The patient is experiencing tenderness in the medial joint line. ? ?Range of Motion  ?The patient has normal right knee ROM. ? ?Other  ?Erythema: absent ?Effusion: no effusion present ? ? ? ? ?Patient is alert, oriented, no adenopathy, well-dressed, normal affect, normal respiratory effort. ? ? ?Imaging: ?No results found. ?No images are attached to the encounter. ? ?Labs: ?Lab Results  ?Component Value Date  ? HGBA1C 8.3 (A) 04/25/2021  ? HGBA1C 7.6 (A) 12/19/2020  ? HGBA1C 7.2 (A) 05/26/2020  ? REPTSTATUS 02/26/2007 FINAL 02/24/2007  ? GRAMSTAIN  02/24/2007  ?  NO WBC SEEN ?RARE SQUAMOUS EPITHELIAL CELLS PRESENT ?FEW GRAM POSITIVE COCCI ?IN CLUSTERS IN PAIRS  ? CULT  02/24/2007  ?  MODERATE METHICILLIN RESISTANT STAPHYLOCOCCUS AUREUS ?Note: RIFAMPIN AND GENTAMICIN SHOULD NOT BE USED AS SINGLE DRUGS FOR TREATMENT OF STAPH INFECTIONS. Contact laboratory if Clindamycin results needed. [phone] CRITICAL RESULT  CALLED TO, READ BACK BY AND VERIFIED WITH: TONY SILVIANO 02/26/07 1020 BY  ?SMITHERSJ  ? LABORGA METHICILLIN RESISTANT STAPHYLOCOCCUS AUREUS 02/24/2007  ? ? ? ?Lab Results  ?Component Value Date  ? ALBUMIN 4.1 06/21/2019  ? ALBUMIN 4.2 09/02/2016  ? ALBUMIN 4.1 08/02/2013  ? ? ?No results found for: MG ?No results found for: VD25OH ? ?No results found for: PREALBUMIN ? ?  Latest Ref Rng & Units 04/11/2021  ?  3:17 PM 06/21/2019  ?  4:43 PM 03/03/2019  ?  3:02 PM  ?CBC EXTENDED  ?WBC 3.4 - 10.8 x10E3/uL 9.0   7.5   6.6    ?RBC 3.77 - 5.28 x10E6/uL 4.89   4.93   4.99    ?Hemoglobin 11.1 - 15.9 g/dL 13.7   13.6   13.9    ?HCT 34.0 - 46.6 % 41.0   44.1   42.8    ?Platelets 150 - 450 x10E3/uL 281   270   214    ?NEUT# 1.7 - 7.7 K/uL  3.8     ?Lymph# 0.7 - 4.0 K/uL  3.0     ? ? ? ?There is no height or weight on file to calculate BMI. ? ?Orders:  ?No orders of the defined types were placed in this encounter. ? ?No orders of the defined types were placed in this encounter. ? ? ? Procedures: ?Large Joint Inj: R knee  on 07/27/2021 10:04 AM ?Indications: pain ?Details: 18 G 1.5 in needle, anteromedial approach ?Medications: 5 mL lidocaine 1 %; 40 mg methylPREDNISolone acetate 40 MG/ML ?Consent was given by the patient.  ? ? ? ?Clinical Data: ?No additional findings. ? ?ROS: ? ?All other systems negative, except as noted in the HPI. ?Review of Systems ? ?Objective: ?Vital Signs: There were no vitals taken for this visit. ? ?Specialty Comments:  ?No specialty comments available. ? ?PMFS History: ?Patient Active Problem List  ? Diagnosis Date Noted  ?? Chronic obstructive pulmonary disease ([place]) 04/25/2021  ?? Gastroesophageal reflux disease 11/10/2020  ?? Anxiety 05/26/2020  ?? Hyperlipidemia associated with type 2 diabetes mellitus ([place]) 10/20/2019  ?? Insomnia 10/06/2019  ?? Type 2 diabetes mellitus without complications ([place]) 00/93/8182  ?? Depression 01/23/2015  ?? Hypertension 02/28/2011  ?? Goiter 02/28/2011  ?? OBESITY,  UNSPECIFIED 03/27/2010  ?? SMOKER 03/27/2010  ?? DYSHIDROTIC ECZEMA, HANDS 03/27/2010  ? ?Past Medical History:  ?Diagnosis Date  ?? Diabetes mellitus without complication ([place])   ?? Gall stone   ?? Hyperlipidemia   ?? Hypertension   ?  ?Family History  ?Problem Relation Age of Onset  ?? Liver cancer Mother   ?? Diabetes Mother   ?? Stroke Father   ?? Breast cancer Paternal Grandmother   ?? Stomach cancer Other   ?? Rectal cancer Other   ?? Esophageal cancer Other   ?? Colon polyps Other   ?? Colon cancer Other   ?  ?Past Surgical History:  ?Procedure Laterality Date  ?? CESAREAN SECTION    ?? CHOLECYSTECTOMY    ?? TUBAL LIGATION    ? ?Social History  ? ?Occupational History  ?? Not on file  ?Tobacco Use  ?? Smoking status: Some Days  ?  Packs/day: 0.25  ?  Types: Cigarettes  ?  Start date: 03/26/1983  ?? Smokeless tobacco: Never  ?? Tobacco comments:  ?  Less than five cigarettes a day.  ?Vaping Use  ?? Vaping Use: Never used  ?Substance and Sexual Activity  ?? Alcohol use: Yes  ?  Comment: socially   ?? Drug use: No  ?? Sexual activity: Not on file  ? ? ? ? ? ?

## 2021-08-06 ENCOUNTER — Other Ambulatory Visit: Payer: Self-pay | Admitting: Family Medicine

## 2021-09-29 ENCOUNTER — Other Ambulatory Visit: Payer: Self-pay | Admitting: Family Medicine

## 2021-10-01 ENCOUNTER — Other Ambulatory Visit: Payer: Self-pay | Admitting: *Deleted

## 2021-10-02 MED ORDER — SEMAGLUTIDE(0.25 OR 0.5MG/DOS) 2 MG/1.5ML ~~LOC~~ SOPN: 0.2500 mg | PEN_INJECTOR | SUBCUTANEOUS | 3 refills | Status: DC

## 2021-10-19 ENCOUNTER — Telehealth: Payer: Self-pay | Admitting: Orthopedic Surgery

## 2021-10-19 NOTE — Telephone Encounter (Signed)
Pt called requesting submission for gel injection in her knee. Pt changed insurance company and calling to asking if they pay for gel injections, Please call pt about this matter at [phone].

## 2021-10-22 NOTE — Telephone Encounter (Signed)
Called and left a VM for patient to call back with no insurance to submit for gel injection.

## 2021-10-25 ENCOUNTER — Telehealth: Payer: Self-pay | Admitting: Orthopedic Surgery

## 2021-10-25 NOTE — Telephone Encounter (Signed)
Patient called in requesting Gel injection in right knee with her new insurance, she usually gets the series of 3 injections but is curious about the 1 injection please advise call her with more information

## 2021-10-25 NOTE — Telephone Encounter (Signed)
LMTCB

## 2021-10-26 NOTE — Telephone Encounter (Signed)
Called and left a Vm for patient concerning the series injection and the 1 injection.

## 2021-10-28 ENCOUNTER — Other Ambulatory Visit: Payer: Self-pay | Admitting: Student

## 2021-11-01 ENCOUNTER — Telehealth: Payer: Self-pay

## 2021-11-01 NOTE — Telephone Encounter (Signed)
VOB submitted for Durolane, right knee.  

## 2021-11-03 ENCOUNTER — Other Ambulatory Visit: Payer: Self-pay | Admitting: Family Medicine

## 2021-11-04 NOTE — Progress Notes (Signed)
  SUBJECTIVE:   CHIEF COMPLAINT / HPI:   Type 2 Diabetes: Last two A1C's below. Home medications include: Ozempic 0.25 mg weekly. Does endorse compliance. Home glucose monitoring is not performed.  She has been taking a couple tabs of metformin here and there because she could not get her Ozempic refilled for this week.  Patient is not up to date on diabetic eye. Patient is not up to date on diabetic foot exam.  Hyperlipidemia: Prescribed atorvastatin 40 mg daily but is not taking because it caused her leg pain.  Amenable to starting a different statin medication after results of lipid panel.  PERTINENT  PMH / PSH: HLD, HTN, T2DM, COPD  OBJECTIVE:  BP 118/78   Pulse 76   Wt 215 lb (97.5 kg)   BMI 36.90 kg/m   General: NAD, pleasant, able to participate in exam Cardiac: RRR, no murmurs auscultated Respiratory: CTAB, normal WOB Skin: warm and dry, no rashes noted  ASSESSMENT/PLAN:  Type 2 diabetes mellitus without complications ([place]) Well controlled - Last A1c: Improved from 8.3. Lab Results  Component Value Date   HGBA1C 6.4 11/05/2021  - Medications: Ozempic 0.25 mg weekly, discontinue metformin use - Compliance: Yes - Checking BG at home: N/A -Diabetic foot exam completed in past today.  Reminded of need for diabetic eye exam.  Hyperlipidemia associated with type 2 diabetes mellitus ([place]) Lipid panel today.  We will discuss restarting statin after results of lipid panel.  Refilled several home medications: Betamethasone cream, Zyrtec, EpiPen.  Return in about 4 months (around 03/07/2022) for Diabetes follow-up. Wells Guiles, DO 11/05/2021, 4:59 PM PGY-2, [place]

## 2021-11-05 ENCOUNTER — Ambulatory Visit (INDEPENDENT_AMBULATORY_CARE_PROVIDER_SITE_OTHER): Payer: Commercial Managed Care - HMO | Admitting: Student

## 2021-11-05 ENCOUNTER — Telehealth: Payer: Self-pay

## 2021-11-05 ENCOUNTER — Other Ambulatory Visit (HOSPITAL_COMMUNITY): Payer: Self-pay

## 2021-11-05 ENCOUNTER — Encounter: Payer: Self-pay | Admitting: Student

## 2021-11-05 VITALS — BP 118/78 | HR 76 | Wt 215.0 lb

## 2021-11-05 DIAGNOSIS — E785 Hyperlipidemia, unspecified: Secondary | ICD-10-CM | POA: Diagnosis not present

## 2021-11-05 DIAGNOSIS — E1169 Type 2 diabetes mellitus with other specified complication: Secondary | ICD-10-CM | POA: Diagnosis not present

## 2021-11-05 DIAGNOSIS — E119 Type 2 diabetes mellitus without complications: Secondary | ICD-10-CM | POA: Diagnosis not present

## 2021-11-05 LAB — POCT GLYCOSYLATED HEMOGLOBIN (HGB A1C): HbA1c, POC (controlled diabetic range): 6.4 % (ref 0.0–7.0)

## 2021-11-05 MED ORDER — EPINEPHRINE 0.3 MG/0.3ML IJ SOAJ: 0.3000 mg | INTRAMUSCULAR | 1 refills | Status: AC | PRN

## 2021-11-05 MED ORDER — BETAMETHASONE DIPROPIONATE 0.05 % EX CREA: TOPICAL_CREAM | Freq: Two times a day (BID) | CUTANEOUS | 0 refills | Status: DC

## 2021-11-05 NOTE — Telephone Encounter (Signed)
Talked with patient and advised her that [employer] is not in Network and gel injection is not covered.  Patient stated that she will be getting another insurance and will call back once received.

## 2021-11-05 NOTE — Assessment & Plan Note (Signed)
Lipid panel today.  We will discuss restarting statin after results of lipid panel.

## 2021-11-05 NOTE — Assessment & Plan Note (Addendum)
Well controlled - Last A1c: Improved from 8.3. Lab Results  Component Value Date   HGBA1C 6.4 11/05/2021  - Medications: Ozempic 0.25 mg weekly, discontinue metformin use - Compliance: Yes - Checking BG at home: N/A -Diabetic foot exam completed in past today.  Reminded of need for diabetic eye exam.

## 2021-11-05 NOTE — Patient Instructions (Signed)
It was great to see you today! Thank you for choosing [facility] for your primary care. Lisa Sullivan was seen for diabetes and cholesterol follow-up.  Today we addressed: Congratulations on continuing to lose weight on the Ozempic and your A1c coming down to 6.4.  He also passed your diabetic foot exam.  Lets continue with the Ozempic. Hyperlipidemia: We are checking your cholesterol today.  I will give you a call regarding these results and what medications that we may start to assist with keeping your cholesterol down.  Please continue to exercise and eat a variable diet with fruits and vegetables with mostly home-cooked foods.  If you haven't already, sign up for My Chart to have easy access to your labs results, and communication with your primary care physician.  We are checking some labs today. If they are abnormal, I will call you. If they are normal, I will send you a MyChart message (if it is active) or a letter in the mail. If you do not hear about your labs in the next 2 weeks, please call the office.   You should return to our clinic Return in about 4 months (around 03/07/2022) for Diabetes follow-up.  I recommend that you always bring your medications to each appointment as this makes it easy to ensure you are on the correct medications and helps [place] not miss refills when you need them.  Please arrive 15 minutes before your appointment to ensure smooth check in process.  We appreciate your efforts in making this happen.  Please call the clinic at [phone] if your symptoms worsen or you have any concerns.  Thank you for allowing me to participate in your care, Wells Guiles, DO 11/05/2021, 4:44 PM PGY-2, [place]

## 2021-11-08 ENCOUNTER — Telehealth: Payer: Self-pay

## 2021-11-08 NOTE — Telephone Encounter (Signed)
Rec'd prior auth request from patients pharmacy for ozempic.   Per insurance prior auth: alternative medications are Metformin, Byetta, Trulicity, or Bydureon. Preferred covered alterative is Trulicity. If patient has not tried this drug, please provide details why this cant be an alternative.

## 2021-11-09 NOTE — Telephone Encounter (Signed)
A Prior Authorization was initiated for this patients OZEMPIC through CoverMyMeds.   Attached chart notes from 05/15/21, 06/21/21,  & 11/05/21  Key: [facility] [employer]

## 2021-11-13 MED ORDER — ONDANSETRON HCL 8 MG PO TABS: 8.0000 mg | ORAL_TABLET | Freq: Three times a day (TID) | ORAL | 0 refills | Status: DC | PRN

## 2021-11-13 MED ORDER — TRULICITY 0.75 MG/0.5ML ~~LOC~~ SOAJ: 0.7500 mg | SUBCUTANEOUS | 0 refills | Status: DC

## 2021-11-13 NOTE — Addendum Note (Signed)
Addended by: Eustace Quail on: 11/13/2021 10:24 AM   Modules accepted: Orders

## 2021-11-13 NOTE — Telephone Encounter (Signed)
Prior Auth for patients medication OZEMPIC denied by [employer] via CoverMyMeds.   Reason: The information submitted did not meet the criteria necessary to approve this medication. SEE EXPLANATION BELOW:   CoverMyMeds Key: [id number]

## 2021-11-13 NOTE — Telephone Encounter (Signed)
Spoke with patient regarding failed prior authorization. She will be switching to [employer] on September 1st because Christella Scheuermann has been rejecting Ozempic. Amenable to Trulicity in the time being and requested Zofran for nausea as she experienced that when starting ozempic previously. I will send in limited Zofran script with 1 month supply of Trulicity 0.'75mg'$  weekly injection. Advised patient that if she wants to switch back to Ozempic when her new insurance starts, that would be fine.

## 2021-12-04 ENCOUNTER — Telehealth: Payer: Self-pay | Admitting: Orthopedic Surgery

## 2021-12-04 NOTE — Telephone Encounter (Signed)
Patient called needing to know when can she schedule for the gel injection with Dr. Sharol Given?  Patient said she is in a lot of pain. The number to contact patient is [phone]

## 2021-12-04 NOTE — Telephone Encounter (Signed)
Talked with patient to verify insurance for gel injection.  Patient stated that she has called twice and provided new insurance, but no insurance was listed in patient's chart.  Submitted VOB for Monovisc, right knee.

## 2021-12-05 ENCOUNTER — Telehealth: Payer: Self-pay

## 2021-12-05 NOTE — Telephone Encounter (Signed)
Talked with patient concerning gel injection.  Advised by Abigail Butts May, that case was closed through [employer], due needing a copy of patient's new insurance card , which is not in patient's chart.  Patient stated that she will contact the insurance and check about getting a card.

## 2021-12-06 ENCOUNTER — Telehealth: Payer: Self-pay | Admitting: Orthopedic Surgery

## 2021-12-06 NOTE — Telephone Encounter (Signed)
Patient called. She would like gel injections. Her call back number is [phone]

## 2021-12-07 NOTE — Telephone Encounter (Signed)
Talked with patient concerning gel injection and patient stated that she faxed her insurance information to our office, but there is nothing in patient's chart.  Advised patient that I was check into this and call her back.

## 2021-12-07 NOTE — Telephone Encounter (Signed)
Provided patient with my work e-mail to send insurance information.

## 2021-12-10 ENCOUNTER — Ambulatory Visit (INDEPENDENT_AMBULATORY_CARE_PROVIDER_SITE_OTHER): Payer: PRIVATE HEALTH INSURANCE | Admitting: Family Medicine

## 2021-12-10 ENCOUNTER — Encounter: Payer: Self-pay | Admitting: Family Medicine

## 2021-12-10 VITALS — BP 131/97 | HR 74 | Ht 64.0 in | Wt 213.2 lb

## 2021-12-10 DIAGNOSIS — Z23 Encounter for immunization: Secondary | ICD-10-CM

## 2021-12-10 DIAGNOSIS — R42 Dizziness and giddiness: Secondary | ICD-10-CM | POA: Insufficient documentation

## 2021-12-10 NOTE — Progress Notes (Signed)
    SUBJECTIVE:   CHIEF COMPLAINT / HPI:   Lightheadedness Has been seen for this twice over the last year. Symptoms have continued. Started this time on Saturday, but she felt like she may have been catching a cold on Friday. Ate a banana today and felt better after that. Does not drink a lot of water. Not dependent on head movement. Sometimes gets worse when she bends down to tie shoes and comes back up, but it is not affected by standing from a sitting position. Notes that her symptoms did get better after she got over a little cold in the past.  PERTINENT  PMH / PSH: T2DM on ozempic, HLD not on statin, HTN on HCTZ, GERD on protonix, COPD on anoro ellipta, albuterol  OBJECTIVE:   BP (!) 131/97   Pulse 74   Ht '5\' 4"'$  (1.626 m)   Wt 213 lb 3.2 oz (96.7 kg)   SpO2 98%   BMI 36.60 kg/m   Orthostatic vital signs - 126/81 lying, 119/84 sitting, 119/79 standing General: Alert and oriented, in NAD Skin: Warm, dry, and intact without lesions HEENT: NCAT, EOM grossly normal, midline nasal septum, Dix-Hallpike negative Cardiac: Regular rate Respiratory: Breathing and speaking comfortably on RA Abdominal: Nondistended Extremities: Moves all extremities grossly equally Neurological: Strength and sensation intact throughout Psychiatric: Appropriate mood and affect  ASSESSMENT/PLAN:   Episodic lightheadedness Symptoms worse when she has a cold could be 2/2 congestion. Component of dehydration and/or lower blood sugar from not eating as much throughout the day (given she felt better after eating) also possible. Vertigo, intracranial process, and orthostasis unlikely based on exam. Recommended continuing Zyrtec, flonase, and adequate intake for symptoms, as I expect them to be alleviated after she is over her cold and makes the above changes. Follow up if symptoms do not resolve.   Health maintenance Flu vaccine given today without incident after dispelling vaccine myths.  Ethelene Hal,  MD [place]

## 2021-12-10 NOTE — Patient Instructions (Signed)
It was great to see you today! Here's what we talked about:  Your exam is very reassuring. I do not believe there is a scary cause to your lightheadedness. I would recommend continuing Zyrtec and flonase as tolerated to reduce congestion. I would also recommend staying hydrated and keeping your blood pressure controlled with the HCTZ. If these symptoms persist, please let us know for further evaluation.  Please let me know if you have any other questions.  Dr. Marcha Dutton

## 2021-12-10 NOTE — Assessment & Plan Note (Signed)
Symptoms worse when she has a cold could be 2/2 congestion. Component of dehydration and/or lower blood sugar from not eating as much throughout the day (given she felt better after eating) also possible. Vertigo, intracranial process, and orthostasis unlikely based on exam. Recommended continuing Zyrtec, flonase, and adequate intake for symptoms, as I expect them to be alleviated after she is over her cold and makes the above changes. Follow up if symptoms do not resolve.

## 2021-12-18 ENCOUNTER — Other Ambulatory Visit: Payer: Self-pay | Admitting: Student

## 2021-12-18 ENCOUNTER — Other Ambulatory Visit (HOSPITAL_COMMUNITY): Payer: Self-pay

## 2021-12-18 ENCOUNTER — Telehealth: Payer: Self-pay

## 2021-12-18 NOTE — Telephone Encounter (Signed)
A Prior Authorization was initiated for this patients Ozempic (0.25 or 0.5 MG/DOSE) '2MG'$ /3ML pen-injectors:#} through CoverMyMeds.   Key: [facility]

## 2021-12-19 ENCOUNTER — Other Ambulatory Visit (HOSPITAL_COMMUNITY): Payer: Self-pay

## 2021-12-20 ENCOUNTER — Other Ambulatory Visit (HOSPITAL_COMMUNITY): Payer: Self-pay

## 2021-12-20 NOTE — Telephone Encounter (Signed)
A Prior Authorization was RE-initiated for this patients OZEMPIC through CoverMyMeds.   Key: [facility]

## 2021-12-20 NOTE — Telephone Encounter (Signed)
Will resubmit PA for ozempic. If denied, will attempt one for Trulicity that was rec'd today.

## 2021-12-21 ENCOUNTER — Other Ambulatory Visit (HOSPITAL_COMMUNITY): Payer: Self-pay

## 2021-12-24 ENCOUNTER — Other Ambulatory Visit (HOSPITAL_COMMUNITY): Payer: Self-pay

## 2021-12-24 NOTE — Telephone Encounter (Signed)
Had to call [employer] and re-submit PA with rep. CoverMyMeds didn't submit info correctly (unsure why). Placed request with rep as EXPEDITED. Should have a decision in 24 hours.

## 2021-12-24 NOTE — Telephone Encounter (Signed)
Patient returns call to nurse line to check status of PA on Ozempic.   Forwarding to [place] for update.   Talbot Grumbling, RN

## 2021-12-25 NOTE — Telephone Encounter (Signed)
Prior Auth for patients medication OZEMPIC approved by [employer] from 03/26/21 to 12/25/22.  APPROVAL LETTER SCANNED TO MEDIA

## 2021-12-26 ENCOUNTER — Emergency Department (HOSPITAL_COMMUNITY)
Admission: EM | Admit: 2021-12-26 | Discharge: 2021-12-26 | Disposition: A | Discharge status: Home / Self Care | Payer: PRIVATE HEALTH INSURANCE | Attending: Physician Assistant | Admitting: Physician Assistant

## 2021-12-26 ENCOUNTER — Emergency Department (HOSPITAL_COMMUNITY): Payer: PRIVATE HEALTH INSURANCE

## 2021-12-26 ENCOUNTER — Encounter (HOSPITAL_COMMUNITY): Payer: Self-pay | Admitting: Emergency Medicine

## 2021-12-26 ENCOUNTER — Telehealth: Payer: Self-pay | Admitting: Orthopedic Surgery

## 2021-12-26 DIAGNOSIS — R519 Headache, unspecified: Secondary | ICD-10-CM | POA: Diagnosis not present

## 2021-12-26 DIAGNOSIS — Z5321 Procedure and treatment not carried out due to patient leaving prior to being seen by health care provider: Secondary | ICD-10-CM | POA: Diagnosis not present

## 2021-12-26 DIAGNOSIS — H532 Diplopia: Secondary | ICD-10-CM | POA: Diagnosis present

## 2021-12-26 DIAGNOSIS — R42 Dizziness and giddiness: Secondary | ICD-10-CM | POA: Insufficient documentation

## 2021-12-26 LAB — CBC WITH DIFFERENTIAL/PLATELET
Abs Immature Granulocytes: 0.02 10*3/uL (ref 0.00–0.07)
Basophils Absolute: 0.1 10*3/uL (ref 0.0–0.1)
Basophils Relative: 1 %
Eosinophils Absolute: 0.1 10*3/uL (ref 0.0–0.5)
Eosinophils Relative: 1 %
HCT: 44.2 % (ref 36.0–46.0)
Hemoglobin: 13.7 g/dL (ref 12.0–15.0)
Immature Granulocytes: 0 %
Lymphocytes Relative: 34 %
Lymphs Abs: 3.1 10*3/uL (ref 0.7–4.0)
MCH: 27.4 pg (ref 26.0–34.0)
MCHC: 31 g/dL (ref 30.0–36.0)
MCV: 88.4 fL (ref 80.0–100.0)
Monocytes Absolute: 0.6 10*3/uL (ref 0.1–1.0)
Monocytes Relative: 7 %
Neutro Abs: 5.2 10*3/uL (ref 1.7–7.7)
Neutrophils Relative %: 57 %
Platelets: 288 10*3/uL (ref 150–400)
RBC: 5 MIL/uL (ref 3.87–5.11)
RDW: 14.1 % (ref 11.5–15.5)
WBC: 9.1 10*3/uL (ref 4.0–10.5)
nRBC: 0 % (ref 0.0–0.2)

## 2021-12-26 LAB — BASIC METABOLIC PANEL
Anion gap: 11 (ref 5–15)
BUN: 18 mg/dL (ref 6–20)
CO2: 27 mmol/L (ref 22–32)
Calcium: 9.8 mg/dL (ref 8.9–10.3)
Chloride: 103 mmol/L (ref 98–111)
Creatinine, Ser: 1.18 mg/dL — ABNORMAL HIGH (ref 0.44–1.00)
GFR, Estimated: 54 mL/min — ABNORMAL LOW (ref >60)
Glucose, Bld: 115 mg/dL — ABNORMAL HIGH (ref 70–99)
Potassium: 4 mmol/L (ref 3.5–5.1)
Sodium: 141 mmol/L (ref 135–145)

## 2021-12-26 LAB — PROTIME-INR
INR: 0.9 (ref 0.8–1.2)
Prothrombin Time: 12.4 seconds (ref 11.4–15.2)

## 2021-12-26 MED ORDER — IOHEXOL 350 MG/ML SOLN
75.0000 mL | Freq: Once | INTRAVENOUS | Status: AC | PRN
  Administered 2021-12-26: 75 mL via INTRAVENOUS

## 2021-12-26 NOTE — Telephone Encounter (Signed)
Pt called requesting a call back from April J for update on gel injection. Please call pt at [phone].

## 2021-12-26 NOTE — Telephone Encounter (Signed)
Talked with patient about gel injection and advised her that [employer] does not cover gel injections. Patient voiced that she understands and would like to submit for TriVisc again.  Will submit for TriVisc on Thursday, 12/27/2021. [employer] portal is down.

## 2021-12-26 NOTE — ED Notes (Signed)
Pt no longer wanted to wait pt left the hospital.

## 2021-12-26 NOTE — ED Triage Notes (Signed)
Patient sent to ED for evaluation of diplopia that started two days ago, resolved prior to arriving to ED. Patient has no complaints nor deficits at this time.

## 2021-12-26 NOTE — ED Provider Triage Note (Signed)
Emergency Medicine Provider Triage Evaluation Note  Lisa Sullivan , a 56 y.o. female  was evaluated in triage.  Pt complains of diplopia.  Was seen by Dr. Katy Fitch with ophthalmology today.  Sent here for further evaluation.  Patient complains of 48 hours of bilateral diplopia.  Denies any complete visual field cuts.  Mild headache and dizziness.  No recent falls or injuries.  No facial droop, difficulty speaking, numbness or weakness in arms or legs.  Ophthalmology sent her here for CT and MRI imaging to rule out CVA. Denies current diplopia.  Review of Systems  Positive: Diplopia Negative:   Physical Exam  There were no vitals taken for this visit. Gen:   Awake, no distress   Resp:  Normal effort  MSK:   Moves extremities without difficulty  Neuro:  CN 2-12 grossly intact, equal hand grip, intact sensation, no drift Other:    Medical Decision Making  Medically screening exam initiated at 2:47 PM.  Appropriate orders placed.  Lisa Sullivan was informed that the remainder of the evaluation will be completed by another provider, this initial triage assessment does not replace that evaluation, and the importance of remaining in the ED until their evaluation is complete.  Diplopia  Neg LVO criteria, Not a code stroke   Kenitra Leventhal A, PA-C 12/26/21 1447

## 2021-12-27 ENCOUNTER — Telehealth: Payer: Self-pay

## 2021-12-27 ENCOUNTER — Other Ambulatory Visit (HOSPITAL_COMMUNITY): Payer: Self-pay

## 2021-12-27 NOTE — Telephone Encounter (Signed)
Patient calls nurse line in regards to recent ED visit.   Patient reports she was at her ophthalmologist when she began to feel dizzy and lightheaded. She was instructed to go to the ED.  Patient reports she had blood work and CT done.   Patient stated she left without results and requesting them now.   Patient advised to FU with PCP. Patient declined apt.   Will forward to PCP.

## 2021-12-27 NOTE — Telephone Encounter (Signed)
Discussed results with patient over the phone. CTA and bloodwork overall unremarkable. She was thankful for call.

## 2022-01-20 ENCOUNTER — Other Ambulatory Visit: Payer: Self-pay

## 2022-01-20 ENCOUNTER — Emergency Department (HOSPITAL_BASED_OUTPATIENT_CLINIC_OR_DEPARTMENT_OTHER)
Admission: EM | Admit: 2022-01-20 | Discharge: 2022-01-20 | Disposition: A | Discharge status: Home / Self Care | Payer: PRIVATE HEALTH INSURANCE | Attending: Emergency Medicine | Admitting: Emergency Medicine

## 2022-01-20 DIAGNOSIS — E876 Hypokalemia: Secondary | ICD-10-CM | POA: Diagnosis not present

## 2022-01-20 DIAGNOSIS — I10 Essential (primary) hypertension: Secondary | ICD-10-CM | POA: Insufficient documentation

## 2022-01-20 DIAGNOSIS — Z91040 Latex allergy status: Secondary | ICD-10-CM | POA: Insufficient documentation

## 2022-01-20 DIAGNOSIS — E1165 Type 2 diabetes mellitus with hyperglycemia: Secondary | ICD-10-CM | POA: Insufficient documentation

## 2022-01-20 DIAGNOSIS — L03317 Cellulitis of buttock: Secondary | ICD-10-CM | POA: Diagnosis not present

## 2022-01-20 DIAGNOSIS — D72829 Elevated white blood cell count, unspecified: Secondary | ICD-10-CM | POA: Diagnosis not present

## 2022-01-20 DIAGNOSIS — J449 Chronic obstructive pulmonary disease, unspecified: Secondary | ICD-10-CM | POA: Diagnosis not present

## 2022-01-20 DIAGNOSIS — L03116 Cellulitis of left lower limb: Secondary | ICD-10-CM

## 2022-01-20 LAB — COMPREHENSIVE METABOLIC PANEL
ALT: 9 U/L (ref 0–44)
AST: 10 U/L — ABNORMAL LOW (ref 15–41)
Albumin: 4.3 g/dL (ref 3.5–5.0)
Alkaline Phosphatase: 81 U/L (ref 38–126)
Anion gap: 11 (ref 5–15)
BUN: 16 mg/dL (ref 6–20)
CO2: 24 mmol/L (ref 22–32)
Calcium: 9.6 mg/dL (ref 8.9–10.3)
Chloride: 100 mmol/L (ref 98–111)
Creatinine, Ser: 1.1 mg/dL — ABNORMAL HIGH (ref 0.44–1.00)
GFR, Estimated: 58 mL/min — ABNORMAL LOW (ref >60)
Glucose, Bld: 138 mg/dL — ABNORMAL HIGH (ref 70–99)
Potassium: 3.4 mmol/L — ABNORMAL LOW (ref 3.5–5.1)
Sodium: 135 mmol/L (ref 135–145)
Total Bilirubin: 0.8 mg/dL (ref 0.3–1.2)
Total Protein: 8.1 g/dL (ref 6.5–8.1)

## 2022-01-20 LAB — CBC WITH DIFFERENTIAL/PLATELET
Abs Immature Granulocytes: 0.04 10*3/uL (ref 0.00–0.07)
Basophils Absolute: 0.1 10*3/uL (ref 0.0–0.1)
Basophils Relative: 0 %
Eosinophils Absolute: 0 10*3/uL (ref 0.0–0.5)
Eosinophils Relative: 0 %
HCT: 41.7 % (ref 36.0–46.0)
Hemoglobin: 13.3 g/dL (ref 12.0–15.0)
Immature Granulocytes: 0 %
Lymphocytes Relative: 21 %
Lymphs Abs: 3.1 10*3/uL (ref 0.7–4.0)
MCH: 27.4 pg (ref 26.0–34.0)
MCHC: 31.9 g/dL (ref 30.0–36.0)
MCV: 86 fL (ref 80.0–100.0)
Monocytes Absolute: 1.3 10*3/uL — ABNORMAL HIGH (ref 0.1–1.0)
Monocytes Relative: 8 %
Neutro Abs: 10.5 10*3/uL — ABNORMAL HIGH (ref 1.7–7.7)
Neutrophils Relative %: 71 %
Platelets: 263 10*3/uL (ref 150–400)
RBC: 4.85 MIL/uL (ref 3.87–5.11)
RDW: 14.1 % (ref 11.5–15.5)
WBC: 14.9 10*3/uL — ABNORMAL HIGH (ref 4.0–10.5)
nRBC: 0 % (ref 0.0–0.2)

## 2022-01-20 LAB — LACTIC ACID, PLASMA: Lactic Acid, Venous: 1.7 mmol/L (ref 0.5–1.9)

## 2022-01-20 MED ORDER — DOXYCYCLINE HYCLATE 100 MG PO CAPS: 100.0000 mg | ORAL_CAPSULE | Freq: Two times a day (BID) | ORAL | 0 refills | Status: DC

## 2022-01-20 NOTE — ED Triage Notes (Signed)
Patient arrives ambulatory to triage with complaints of left thigh redness and swelling x2 days. Patient states she unsure what caused it, but she did have a fever of 101 at home.

## 2022-01-20 NOTE — ED Provider Notes (Signed)
[facility] DEPT Provider Note   CSN: [id number] Arrival date & time: 01/20/22  1016     History Chief Complaint  Patient presents with   Abscess    Left thigh    VICTORIYA POL is a 56 y.o. female with history of hypertension, diabetes, hyperlipidemia, COPD presents the emergency room for evaluation of of left thigh swelling and pain.  Patient reports that she noted the area on Friday, and got worse since yesterday.  She denies any itching to the area.  Reports that it is painful.  She denies any trauma to the area or any recent insect bites.  Denies any injections to the area as well.  She reports that she had a fever yesterday and took some Tylenol yesterday, but none today.  No fever noted today.  She also reports that she feels the same pain in her left upper glute.   Abscess Associated symptoms: fever        Home Medications Prior to Admission medications   Medication Sig Start Date End Date Taking? Authorizing Provider  doxycycline (VIBRAMYCIN) 100 MG capsule Take 1 capsule (100 mg total) by mouth 2 (two) times daily. 01/20/22  Yes Sherrell Puller, PA-C  acetaminophen (TYLENOL) 650 MG CR tablet Take 650 mg by mouth every 8 (eight) hours as needed for pain.    [provider]  albuterol (VENTOLIN HFA) 108 (90 Base) MCG/ACT inhaler Inhale 2 puffs into the lungs every 6 (six) hours as needed for wheezing or shortness of breath. Patient not taking: Reported on 11/05/2021 11/06/20   Lyndee Hensen, DO  betamethasone dipropionate 0.05 % cream Apply topically 2 (two) times daily. Hands. Do not touch your face or other sensitive areas after using the cream. 11/05/21   Wells Guiles, DO  cetirizine (ZYRTEC) 10 MG tablet TAKE 1 TABLET(10 MG) BY MOUTH DAILY 11/05/21   Wells Guiles, DO  diclofenac sodium (VOLTAREN) 1 % GEL Apply 4 g topically 4 (four) times daily. 10/07/18   Shirley, [place], DO  Dulaglutide (TRULICITY) 1.76 HY/0.7PX SOPN ADMINISTER 0.75 MG  UNDER THE SKIN 1 TIME A WEEK FOR 4 DOSES 12/18/21   Wells Guiles, DO  EPINEPHrine 0.3 mg/0.3 mL IJ SOAJ injection Inject 0.3 mg into the muscle as needed for anaphylaxis. 11/05/21   Wells Guiles, DO  fluticasone (FLONASE) 50 MCG/ACT nasal spray SHAKE LIQUID AND USE 2 SPRAYS IN [facility] NOSTRIL DAILY 04/28/21   Sherrell Puller, PA-C  hydrochlorothiazide (HYDRODIURIL) 25 MG tablet TAKE 1 TABLET(25 MG) BY MOUTH DAILY 08/06/21   Vanessa Greer, Ryan, DO  ondansetron (ZOFRAN) 8 MG tablet Take 1 tablet (8 mg total) by mouth every 8 (eight) hours as needed for nausea or vomiting. 11/13/21   Wells Guiles, DO  pantoprazole (PROTONIX) 40 MG tablet Take 1 tablet (40 mg total) by mouth daily. 11/06/20   Brimage, Ronnette Juniper, DO  umeclidinium-vilanterol (ANORO ELLIPTA) 62.5-25 MCG/ACT AEPB Inhale 1 puff into the lungs daily. Patient not taking: Reported on 11/05/2021 04/25/21   Lurline Del, DO      Allergies    Bee venom, Sulfa antibiotics, and Latex    Review of Systems   Review of Systems  Constitutional:  Positive for fever.  Skin:  Positive for rash.    Physical Exam Updated Vital Signs BP 106/82 (BP Location: Right Arm)   Pulse 85   Temp 98.3 F (36.8 C) (Oral)   Resp 18   Ht '5\' 4"'$  (1.626 m)   Wt 96.6 kg   SpO2 95%  BMI 36.56 kg/m  Physical Exam Vitals and nursing note reviewed.  Constitutional:      General: She is not in acute distress.    Appearance: Normal appearance. She is not ill-appearing or toxic-appearing.  Eyes:     General: No scleral icterus. Pulmonary:     Effort: Pulmonary effort is normal. No respiratory distress.  Musculoskeletal:        General: Tenderness present.     Comments: On the anterior left thigh, there is an area of erythema, warmth, and induration approximately 6 cm x 6 cm.  There is no fluctuance noted to the area.  No red streaking noted.  It is tender to palpation.  Please see image.  Additionally, the patient has some pain into her left upper gluteal.  There is an area  smaller than a nickel on the patient's left upper lip.  No increased erythema or warmth to this area however it is slightly indurated with some pain to palpation.  No red streaking noted to this area as well.  No skin sloughing or discharge noted to the area.  Skin:    General: Skin is dry.     Findings: No rash.  Neurological:     General: No focal deficit present.     Mental Status: She is alert. Mental status is at baseline.  Psychiatric:        Mood and Affect: Mood normal.        ED Results / Procedures / Treatments   Labs (all labs ordered are listed, but only abnormal results are displayed) Labs Reviewed  COMPREHENSIVE METABOLIC PANEL - Abnormal; Notable for the following components:      Result Value   Potassium 3.4 (*)    Glucose, Bld 138 (*)    Creatinine, Ser 1.10 (*)    AST 10 (*)    GFR, Estimated 58 (*)    All other components within normal limits  CBC WITH DIFFERENTIAL/PLATELET - Abnormal; Notable for the following components:   WBC 14.9 (*)    Neutro Abs 10.5 (*)    Monocytes Absolute 1.3 (*)    All other components within normal limits  LACTIC ACID, PLASMA  LACTIC ACID, PLASMA    EKG None  Radiology No results found.  Procedures Procedures   Medications Ordered in ED Medications - No data to display  ED Course/ Medical Decision Making/ A&P                           Medical Decision Making Amount and/or Complexity of Data Reviewed Labs: ordered.  Risk Prescription drug management.   56 year old female presents to the emergency room and for evaluation of left thigh and left gluteal pain.  Differential diagnosis includes was limited to abscess versus cellulitis versus urticaria versus cyst.  Vital signs are unremarkable.  Patient normotensive, afebrile, normal pulse rate, satting well on room air without increased work of breathing.  Physical exam as noted above.  Please see image for further information.  Labs ordered in triage.  CBC shows  increased white blood cell count at 14.9 with a left shift.  No anemia.  CMP shows slightly decreased potassium 3 and 4.  Elevated glucose at 130 of the patient is known diabetic.  Creatinine at 1.10 which is at the patient's baseline.  Lactic acid normal at 1.7.  The patient reports a fever last night however has not had any antipyretic medications since earlier yesterday afternoon.  No  fever today.  Vital signs are unremarkable.  Not consistent with any sepsis.  I performed a bedside ultrasound to observe if there is any underlying abscess formation.  Cellulitic changes seen on ultrasound.  Attending at bedside and agrees.  Given this, will place patient on doxycycline.  There is nothing to incise and drain at this time.  I discussed with the patient and her members at bedside that this could develop into an abscess, however at this point, there is nothing to drain.  I recommended warm compresses and to use Tylenol or ibuprofen as needed for pain.  Recommended applying warm compresses to the area as well.  We discussed return precautions and red flag symptoms.  Patient verbalized understanding and agrees to the plan.  Patient is stable being discharged home in good condition.  I discussed this case with my attending physician who cosigned this note including patient's presenting symptoms, physical exam, and planned diagnostics and interventions. Attending physician stated agreement with plan or made changes to plan which were implemented.   Attending physician assessed patient at bedside.  Final Clinical Impression(s) / ED Diagnoses Final diagnoses:  Cellulitis of left lower extremity  Cellulitis, gluteal, left    Rx / DC Orders ED Discharge Orders          Ordered    doxycycline (VIBRAMYCIN) 100 MG capsule  2 times daily        01/20/22 1404              Sherrell Puller, PA-C 01/20/22 Lawson, [place], DO 01/20/22 1522

## 2022-01-20 NOTE — Discharge Instructions (Addendum)
You were seen in the ER for evaluation of the swelling and pain to the front of your thigh. This appears to be cellulitis, or inflammation of the skin.  This could turn into an abscess or boil, but there is nothing to drain at this time.  I would continue placing warm compresses to the area.  I will also be prescribing you an antibiotic called doxycycline which she will take twice a day for the next 7 days.  You can take Tylenol or ibuprofen as needed for pain.  Please follow-up with your primary care doctor for reevaluation.  If you have any fevers, worsening redness, worsening swelling, worsening pain, please return to the nurse for department reevaluation.  If you have a concern for new or worsening symptoms, please return the nearest emergency room for evaluations.  Contact a doctor if: You have a fever. You do not start to get better after 1-2 days of treatment. Your bone or joint under the infected area starts to hurt after the skin has healed. Your infection comes back. This can happen in the same area or another area. You have a swollen bump in the area. You have new symptoms. You feel ill and have muscle aches and pains. Get help right away if: Your symptoms get worse. You feel very sleepy. You throw up (vomit) or have watery poop (diarrhea) for a long time. You see red streaks coming from the area. Your red area gets larger. Your red area turns dark in color. These symptoms may represent a serious problem that is an emergency. Do not wait to see if the symptoms will go away. Get medical help right away. Call your local emergency services (911 in the [place] Do not drive yourself to the hospital.

## 2022-02-05 ENCOUNTER — Other Ambulatory Visit: Payer: Self-pay

## 2022-02-05 DIAGNOSIS — K219 Gastro-esophageal reflux disease without esophagitis: Secondary | ICD-10-CM

## 2022-02-05 MED ORDER — PANTOPRAZOLE SODIUM 40 MG PO TBEC: 40.0000 mg | DELAYED_RELEASE_TABLET | Freq: Every day | ORAL | 3 refills | Status: DC

## 2022-02-05 NOTE — Telephone Encounter (Signed)
Request for the following medication was received. Did not see medication on med list.  Metformin 500 mg  Please send Rx if appropriate.   Ottis Stain, CMA

## 2022-03-06 ENCOUNTER — Ambulatory Visit (INDEPENDENT_AMBULATORY_CARE_PROVIDER_SITE_OTHER): Payer: PRIVATE HEALTH INSURANCE

## 2022-03-06 ENCOUNTER — Encounter: Payer: Self-pay | Admitting: Family

## 2022-03-06 ENCOUNTER — Ambulatory Visit (INDEPENDENT_AMBULATORY_CARE_PROVIDER_SITE_OTHER): Payer: PRIVATE HEALTH INSURANCE | Admitting: Family

## 2022-03-06 DIAGNOSIS — M25572 Pain in left ankle and joints of left foot: Secondary | ICD-10-CM

## 2022-03-06 DIAGNOSIS — M5416 Radiculopathy, lumbar region: Secondary | ICD-10-CM

## 2022-03-06 MED ORDER — PREDNISONE 50 MG PO TABS: ORAL_TABLET | ORAL | 0 refills | Status: DC

## 2022-03-06 NOTE — Progress Notes (Signed)
Office Visit Note   Patient: Lisa Sullivan           Date of Birth: 1966/03/12           MRN: [id number] Visit Date: 03/06/2022              Requested by: Wells Guiles, DO [place],  [place] [address] PCP: Wells Guiles, DO  Chief Complaint  Patient presents with   Left Ankle - Pain      HPI: The patient is a 57 year old woman who presents today complaining of left-sided lateral ankle pain this has been ongoing since May.  She states that she did have a fall in May she tripped over a toy at work she did not seek medical attention at that time since that time she has had gradually worsening lateral ankle pain as well as pain in the left posterior thigh and lateral knee she complains of pain with ambulation pain lying flat in bed at night  No numbness no weakness  Assessment & Plan: Visit Diagnoses:  1. Pain in left ankle and joints of left foot     Plan: Will place her on a prednisone course.  If we cannot get relief consider lumbar MRI or referral for epidural steroid injection  Follow-Up Instructions: No follow-ups on file.   Left Ankle Exam  Left ankle exam is normal.   Back Exam   Tenderness  The patient is experiencing tenderness in the lumbar.  Muscle Strength  The patient has normal back strength.  Tests  Straight leg raise left: positive  Other  Gait: normal       Patient is alert, oriented, no adenopathy, well-dressed, normal affect, normal respiratory effort.   Imaging: No results found. No images are attached to the encounter.  Labs: Lab Results  Component Value Date   HGBA1C 6.4 11/05/2021   HGBA1C 8.3 (A) 04/25/2021   HGBA1C 7.6 (A) 12/19/2020   REPTSTATUS 02/26/2007 FINAL 02/24/2007   GRAMSTAIN  02/24/2007    NO WBC SEEN RARE SQUAMOUS EPITHELIAL CELLS PRESENT FEW GRAM POSITIVE COCCI IN CLUSTERS IN PAIRS   CULT  02/24/2007    MODERATE METHICILLIN RESISTANT STAPHYLOCOCCUS AUREUS Note: RIFAMPIN AND GENTAMICIN  SHOULD NOT BE USED AS SINGLE DRUGS FOR TREATMENT OF STAPH INFECTIONS. Contact laboratory if Clindamycin results needed. [phone] CRITICAL RESULT CALLED TO, READ BACK BY AND VERIFIED WITH: TONY SILVIANO 02/26/07 1020 BY  SMITHERSJ   LABORGA METHICILLIN RESISTANT STAPHYLOCOCCUS AUREUS 02/24/2007     Lab Results  Component Value Date   ALBUMIN 4.3 01/20/2022   ALBUMIN 4.1 06/21/2019   ALBUMIN 4.2 09/02/2016    No results found for: "MG" No results found for: "VD25OH"  No results found for: "PREALBUMIN"    Latest Ref Rng & Units 01/20/2022   11:22 AM 12/26/2021    2:51 PM 04/11/2021    3:17 PM  CBC EXTENDED  WBC 4.0 - 10.5 K/uL 14.9  9.1  9.0   RBC 3.87 - 5.11 MIL/uL 4.85  5.00  4.89   Hemoglobin 12.0 - 15.0 g/dL 13.3  13.7  13.7   HCT 36.0 - 46.0 % 41.7  44.2  41.0   Platelets 150 - 400 K/uL 263  288  281   NEUT# 1.7 - 7.7 K/uL 10.5  5.2    Lymph# 0.7 - 4.0 K/uL 3.1  3.1       There is no height or weight on file to calculate BMI.  Orders:  Orders Placed  This Encounter  Procedures   XR Ankle 2 Views Left   No orders of the defined types were placed in this encounter.    Procedures: No procedures performed  Clinical Data: No additional findings.  ROS:  All other systems negative, except as noted in the HPI. Review of Systems  Objective: Vital Signs: There were no vitals taken for this visit.  Specialty Comments:  No specialty comments available.  PMFS History: Patient Active Problem List   Diagnosis Date Noted   Episodic lightheadedness 12/10/2021   Chronic obstructive pulmonary disease ([place]) 04/25/2021   Gastroesophageal reflux disease 11/10/2020   Anxiety 05/26/2020   Hyperlipidemia associated with type 2 diabetes mellitus ([place]) 10/20/2019   Insomnia 10/06/2019   Type 2 diabetes mellitus without complications ([place]) 29/57/4734   Depression 01/23/2015   Hypertension 02/28/2011   Goiter 02/28/2011   OBESITY, UNSPECIFIED 03/27/2010   SMOKER  03/27/2010   DYSHIDROTIC ECZEMA, HANDS 03/27/2010   Past Medical History:  Diagnosis Date   Diabetes mellitus without complication ([place])    Gall stone    Hyperlipidemia    Hypertension     Family History  Problem Relation Age of Onset   Liver cancer Mother    Diabetes Mother    Stroke Father    Breast cancer Paternal Grandmother    Stomach cancer Other    Rectal cancer Other    Esophageal cancer Other    Colon polyps Other    Colon cancer Other     Past Surgical History:  Procedure Laterality Date   CESAREAN SECTION     CHOLECYSTECTOMY     TUBAL LIGATION     Social History   Occupational History   Not on file  Tobacco Use   Smoking status: Some Days    Packs/day: 0.25    Types: Cigarettes    Start date: 03/26/1983    Passive exposure: Past   Smokeless tobacco: Never   Tobacco comments:    Less than five cigarettes a day.  Vaping Use   Vaping Use: Never used  Substance and Sexual Activity   Alcohol use: Yes    Comment: socially    Drug use: No   Sexual activity: Not on file

## 2022-03-12 ENCOUNTER — Encounter: Payer: Self-pay | Admitting: Student

## 2022-03-12 ENCOUNTER — Ambulatory Visit (INDEPENDENT_AMBULATORY_CARE_PROVIDER_SITE_OTHER): Payer: PRIVATE HEALTH INSURANCE | Admitting: Student

## 2022-03-12 VITALS — BP 132/74 | HR 68 | Temp 98.0°F | Wt 219.6 lb

## 2022-03-12 DIAGNOSIS — E119 Type 2 diabetes mellitus without complications: Secondary | ICD-10-CM

## 2022-03-12 DIAGNOSIS — R059 Cough, unspecified: Secondary | ICD-10-CM

## 2022-03-12 LAB — POCT GLYCOSYLATED HEMOGLOBIN (HGB A1C): HbA1c, POC (controlled diabetic range): 6.6 % (ref 0.0–7.0)

## 2022-03-12 NOTE — Progress Notes (Signed)
    SUBJECTIVE:   CHIEF COMPLAINT / HPI:   Patient is a 56 year old female presenting today for cough. Cough started 3 weeks ago Report she has chills, and congestion at the beginning of her symptoms which have since resolved Cough is productive of white phlem  Patient has tried night quil, mucinex, tylenol. Patient reports being a chronic tobacco user. Smoke about 1/2 a pack a day for 10 years. Has smoked for about a week now Patient report she's doesn't have COPD and the inhalers for COPD wasn't well tolerated No longer on the inhalers  Denies any SOB, chest pain or lightheadness.    Type 2 diabetes She reports she would like to check her blood glucose today. Last A1c 4 months ago was 6.4. Patient includes Trulicity 7.65 mg weekly.  Patient endorses compliance and tolerance to medication. Recently completed 5-day course of prednisone 50 mg for lower back pain.   PERTINENT  PMH / PSH: Reviewed  OBJECTIVE:   BP 132/74   Pulse 68   Temp 98 F (36.7 C) (Oral)   Wt 219 lb 9.6 oz (99.6 kg)   SpO2 99%   BMI 37.69 kg/m    Physical Exam General: Alert, well appearing, NAD Cardiovascular: RRR, No Murmurs, Normal S2/S2 Respiratory: CTAB, No wheezing or Rales Extremities: No edema on extremities     ASSESSMENT/PLAN:   Cough With multiple potential causes of cough which include COPD, history of smoking, GERD, and recent viral illness.  Tums are likely due to postviral cough given history and exam finding.  [facility] rhinitis also considered, less likely heart failure given absence of bilateral lower extremity edema and clear breath sounds bilaterally on exam. -Discussed smoking cessation -Encourage patient to continue warm water, honey and lemon -Discussed trying Tessalon Pearls and patient says this is an effective for her and she is uninterested. -Informed patient postviral cough could last anywhere between 4-6 weeks. -Plan to follow-up in 2 weeks if cough is unchanged and at  that time we will explore there possible causes including COPD.  Unable to discuss using the inhaler today as patient is adamant that she does not have COPD and she is nontolerant to the inhalers.  Type 2 Diabetes Since A1c today is within goal at 6.6% although.  No hypoglycemic episode but endorses hyperglycemic episodes with polyuria and polydipsia recently.  This is likely secondary to her recent prednisone treatment. -Obtain A1c today -Follow-up with PCP for continued management   Alen Bleacher, MD [facility]

## 2022-03-12 NOTE — Patient Instructions (Signed)
It was wonderful to meet you today. Thank you for allowing me to be a part of your care. Below is a short summary of what we discussed at your visit today:  Your cough is most likely postviral cough.  I recommend continued use of warm water, honey and lemon.  If cough continues for another 2 weeks please return and we can look into other possible causes including COPD  They will check your A1c which was 6.6%  Please bring all of your medications to every appointment!  If you have any questions or concerns, please do not hesitate to contact us via phone or MyChart message.   Alen Bleacher, MD [facility]

## 2022-03-28 ENCOUNTER — Telehealth: Payer: Self-pay

## 2022-03-28 DIAGNOSIS — E119 Type 2 diabetes mellitus without complications: Secondary | ICD-10-CM

## 2022-03-28 MED ORDER — SEMAGLUTIDE(0.25 OR 0.5MG/DOS) 2 MG/1.5ML ~~LOC~~ SOPN: 0.2500 mg | PEN_INJECTOR | SUBCUTANEOUS | 3 refills | Status: DC

## 2022-03-28 NOTE — Telephone Encounter (Signed)
Patient calls nurse line requesting a refill on Ozempic.   I do not see where Ozempic is on her current medication. She reports she was switched to Trulicity due to insurance, however reports new insurance and reports approval for Ozempic.   She reports she received her Ozempic pen last month.   She reports being on .'25mg'$ .  Will forward to PCP for advisement.

## 2022-03-29 MED ORDER — SEMAGLUTIDE(0.25 OR 0.5MG/DOS) 2 MG/1.5ML ~~LOC~~ SOPN: 0.2500 mg | PEN_INJECTOR | SUBCUTANEOUS | 3 refills | Status: DC

## 2022-03-29 NOTE — Telephone Encounter (Signed)
Patient calls nurse line regarding Ozempic. She reports that her insurance is not in network with [facility] and she will need to have rx transferred to [facility].   Called and canceled at [facility]. [facility] on [address].   Talbot Grumbling, RN

## 2022-03-29 NOTE — Addendum Note (Signed)
Addended by: Talbot Grumbling on: 03/29/2022 02:25 PM   Modules accepted: Orders

## 2022-04-01 ENCOUNTER — Other Ambulatory Visit: Payer: Self-pay | Admitting: Student

## 2022-04-01 ENCOUNTER — Other Ambulatory Visit (HOSPITAL_COMMUNITY): Payer: Self-pay

## 2022-04-01 ENCOUNTER — Telehealth: Payer: Self-pay

## 2022-04-01 DIAGNOSIS — E119 Type 2 diabetes mellitus without complications: Secondary | ICD-10-CM

## 2022-04-01 NOTE — Telephone Encounter (Signed)
A Prior Authorization was initiated for this patients OZEMPIC through CoverMyMeds.   PATIENT HAS MULTIPLE INSURANCES - [place] FOR [employer]  Key: [id number]

## 2022-04-05 NOTE — Telephone Encounter (Addendum)
Prior Auth for patients medication OZEMPIC denied by [employer] via CoverMyMeds.   Reason:   DENIAL LETTER SCANNED TO MEDIA CoverMyMeds Key: [id number]

## 2022-06-06 ENCOUNTER — Telehealth: Payer: PRIVATE HEALTH INSURANCE | Admitting: Orthopedic Surgery

## 2022-06-06 NOTE — Telephone Encounter (Signed)
Patient wanting to get gel injection in her right knee

## 2022-06-06 NOTE — Telephone Encounter (Addendum)
Talked with patient concerning gel injection and patient provided me with new insurance information. ID# [id number]   VOB submitted for Monovisc, right knee

## 2022-06-12 ENCOUNTER — Other Ambulatory Visit (INDEPENDENT_AMBULATORY_CARE_PROVIDER_SITE_OTHER): Payer: 59

## 2022-06-12 ENCOUNTER — Telehealth: Payer: Self-pay | Admitting: Family

## 2022-06-12 ENCOUNTER — Ambulatory Visit (INDEPENDENT_AMBULATORY_CARE_PROVIDER_SITE_OTHER): Payer: 59 | Admitting: Family

## 2022-06-12 ENCOUNTER — Telehealth: Payer: Self-pay

## 2022-06-12 ENCOUNTER — Encounter: Payer: Self-pay | Admitting: Family

## 2022-06-12 DIAGNOSIS — M5416 Radiculopathy, lumbar region: Secondary | ICD-10-CM

## 2022-06-12 DIAGNOSIS — M1711 Unilateral primary osteoarthritis, right knee: Secondary | ICD-10-CM

## 2022-06-12 DIAGNOSIS — M1712 Unilateral primary osteoarthritis, left knee: Secondary | ICD-10-CM

## 2022-06-12 MED ORDER — PREDNISONE 10 MG PO TABS: 10.0000 mg | ORAL_TABLET | Freq: Every day | ORAL | 0 refills | Status: DC

## 2022-06-12 MED ORDER — HYDROCODONE-ACETAMINOPHEN 5-325 MG PO TABS: 1.0000 | ORAL_TABLET | Freq: Four times a day (QID) | ORAL | 0 refills | Status: DC | PRN

## 2022-06-12 NOTE — Telephone Encounter (Signed)
Patient would like her [facility]s [facility] [address] please advise

## 2022-06-12 NOTE — Progress Notes (Signed)
Office Visit Note   Patient: Lisa Sullivan           Date of Birth: [birth year]           MRN: [id number] Visit Date: 06/12/2022              Requested by: Wells Guiles, DO [place],  [place] [id number] PCP: Wells Guiles, DO  Chief Complaint  Patient presents with   Right Knee - Follow-up      HPI: The patient is a 57 year old woman who presents today concern for bilateral knee pain.  She is rubbing the posterior thighs posterior knees complaining of right worse than left posterior thigh pain she states this radiates all the way down to her feet.  Pain with weightbearing but also pain at rest difficulty getting comfortable when seated or lying in bed  Completed a course of prednisone in December for lumbar radiculopathy.  This eased her symptoms while taking but the pain returned after the course was completed.  She did have a fall last year and landed on both knees back in August.  She wonders if this had aggravated her symptoms.  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 Primary osteoarthritis of right knee   2. Primary osteoarthritis of left knee     Plan: Will refer for a lumbar MRI.  Hope to get her in with Dr. Ernestina Patches after MRI.  Follow-Up Instructions: No follow-ups on file.   Right Knee Exam   Muscle Strength  The patient has normal right knee strength.  Tenderness  The patient is experiencing tenderness in the medial joint line and lateral joint line.  Range of Motion  The patient has normal right knee ROM.  Other  Effusion: no effusion present   Left Knee Exam  Left knee exam is normal.  Range of Motion  The patient has normal left knee ROM.  Other  Effusion: no effusion present   Back Exam   Tenderness  The patient is experiencing tenderness in the lumbar.  Range of Motion  The patient has normal back ROM.  Muscle Strength  The patient  has normal back strength.  Tests  Straight leg raise right: positive Straight leg raise left: negative  Other  Gait: abnormal       Patient is alert, oriented, no adenopathy, well-dressed, normal affect, normal respiratory effort.   Imaging: No results found. No images are attached to the encounter.  Labs: Lab Results  Component Value Date   HGBA1C 6.6 03/12/2022   HGBA1C 6.4 11/05/2021   HGBA1C 8.3 (A) 04/25/2021   REPTSTATUS 02/26/2007 FINAL 02/24/2007   GRAMSTAIN  02/24/2007    NO WBC SEEN RARE SQUAMOUS EPITHELIAL CELLS PRESENT FEW GRAM POSITIVE COCCI IN CLUSTERS IN PAIRS   CULT  02/24/2007    MODERATE METHICILLIN RESISTANT STAPHYLOCOCCUS AUREUS Note: RIFAMPIN AND GENTAMICIN SHOULD NOT BE USED AS SINGLE DRUGS FOR TREATMENT OF STAPH INFECTIONS. Contact laboratory if Clindamycin results needed. [phone] CRITICAL RESULT CALLED TO, READ BACK BY AND VERIFIED WITH: TONY SILVIANO 02/26/07 1020 BY  SMITHERSJ   LABORGA METHICILLIN RESISTANT STAPHYLOCOCCUS AUREUS 02/24/2007     Lab Results  Component Value Date   ALBUMIN 4.3 01/20/2022   ALBUMIN 4.1 06/21/2019   ALBUMIN 4.2 09/02/2016    No results found for: "MG" No results found for: "VD25OH"  No results found for: "PREALBUMIN"    Latest Ref Rng &  Units 01/20/2022   11:22 AM 12/26/2021    2:51 PM 04/11/2021    3:17 PM  CBC EXTENDED  WBC 4.0 - 10.5 K/uL 14.9  9.1  9.0   RBC 3.87 - 5.11 MIL/uL 4.85  5.00  4.89   Hemoglobin 12.0 - 15.0 g/dL 13.3  13.7  13.7   HCT 36.0 - 46.0 % 41.7  44.2  41.0   Platelets 150 - 400 K/uL 263  288  281   NEUT# 1.7 - 7.7 K/uL 10.5  5.2    Lymph# 0.7 - 4.0 K/uL 3.1  3.1       There is no height or weight on file to calculate BMI.  Orders:  Orders Placed This Encounter  Procedures   XR Knee 1-2 Views Right   XR Knee 1-2 Views Left   No orders of the defined types were placed in this encounter.    Procedures: No procedures performed  Clinical Data: No additional  findings.  ROS:  All other systems negative, except as noted in the HPI. Review of Systems  Objective: Vital Signs: There were no vitals taken for this visit.  Specialty Comments:  No specialty comments available.  PMFS History: Patient Active Problem List   Diagnosis Date Noted   Episodic lightheadedness 12/10/2021   Chronic obstructive pulmonary disease ([place]) 04/25/2021   Gastroesophageal reflux disease 11/10/2020   Anxiety 05/26/2020   Hyperlipidemia associated with type 2 diabetes mellitus ([place]) 10/20/2019   Insomnia 10/06/2019   Type 2 diabetes mellitus without complications ([place]) [id number]   Depression 01/23/2015   Hypertension 02/28/2011   Goiter 02/28/2011   OBESITY, UNSPECIFIED 03/27/2010   SMOKER 03/27/2010   DYSHIDROTIC ECZEMA, HANDS 03/27/2010   Past Medical History:  Diagnosis Date   Diabetes mellitus without complication ([place])    Gall stone    Hyperlipidemia    Hypertension     Family History  Problem Relation Age of Onset   Liver cancer Mother    Diabetes Mother    Stroke Father    Breast cancer Paternal Grandmother    Stomach cancer Other    Rectal cancer Other    Esophageal cancer Other    Colon polyps Other    Colon cancer Other     Past Surgical History:  Procedure Laterality Date   CESAREAN SECTION     CHOLECYSTECTOMY     TUBAL LIGATION     Social History   Occupational History   Not on file  Tobacco Use   Smoking status: Some Days    Packs/day: .25    Types: Cigarettes    Start date: 03/26/1983    Passive exposure: Past   Smokeless tobacco: Never   Tobacco comments:    Less than five cigarettes a day.  Vaping Use   Vaping Use: Never used  Substance and Sexual Activity   Alcohol use: Yes    Comment: socially    Drug use: No   Sexual activity: Not on file

## 2022-06-12 NOTE — Telephone Encounter (Signed)
Faxed completed PA form to [facility] for [place], right knee at [phone]. PA pending

## 2022-06-12 NOTE — Addendum Note (Signed)
Addended by: Suzan Slick on: 06/12/2022 03:22 PM   Modules accepted: Orders

## 2022-06-13 ENCOUNTER — Telehealth: Payer: Self-pay | Admitting: Orthopedic Surgery

## 2022-06-13 NOTE — Telephone Encounter (Signed)
Received call from [place] with Nila Nephew she received the request  for monovisc and  it will be forwarded to  [employer]  The number to contact Elmyra Ricks is [phone]

## 2022-06-14 NOTE — Telephone Encounter (Signed)
Noted! Thank you

## 2022-06-17 ENCOUNTER — Telehealth: Payer: Self-pay

## 2022-06-17 NOTE — Telephone Encounter (Signed)
Patient calls nurse line requesting a new prescription for Ozempic.   She reports her insurance has changed and she would like to try Ozempic again. She reports her current insurance is not covering Trulicity anymore. She switched from [place] to [place].   Will forward to PCP to send in a prescription for Ozempic.

## 2022-06-18 MED ORDER — SEMAGLUTIDE(0.25 OR 0.5MG/DOS) 2 MG/3ML ~~LOC~~ SOPN: 0.2500 mg | PEN_INJECTOR | SUBCUTANEOUS | 1 refills | Status: DC

## 2022-06-24 ENCOUNTER — Other Ambulatory Visit: Payer: Self-pay

## 2022-06-24 MED ORDER — SEMAGLUTIDE(0.25 OR 0.5MG/DOS) 2 MG/3ML ~~LOC~~ SOPN: 0.2500 mg | PEN_INJECTOR | SUBCUTANEOUS | 1 refills | Status: DC

## 2022-06-24 NOTE — Telephone Encounter (Signed)
Patient calls nurse line regarding issues with picking up [place].  She states [facility] at [facility]  This will likely requiring prior authorization. She states that she has been two weeks without medication. Including [place] and pharmacy team to assist with authorization and possible samples.  Talbot Grumbling, RN

## 2022-06-26 ENCOUNTER — Other Ambulatory Visit (HOSPITAL_COMMUNITY): Payer: Self-pay

## 2022-06-26 NOTE — Telephone Encounter (Signed)
When running test claim, I receive a "plan exclusion error" A PA will not usually help with these situations. I did try a test claim for Trulicity, as that is usually the preferred option by most insurances and received a message that a PA would be needed for this medication as well due to "step therapy required"  No PA for Ozempic submitted at this time, please change medication if appropriate or advise. Thank you.

## 2022-06-27 ENCOUNTER — Telehealth: Payer: Self-pay

## 2022-06-27 ENCOUNTER — Other Ambulatory Visit (HOSPITAL_COMMUNITY): Payer: Self-pay

## 2022-06-27 DIAGNOSIS — E119 Type 2 diabetes mellitus without complications: Secondary | ICD-10-CM

## 2022-06-27 NOTE — Telephone Encounter (Signed)
A Prior Authorization was initiated for this patients OZEMPIC through CoverMyMeds.   Key: [id number]

## 2022-06-27 NOTE — Telephone Encounter (Addendum)
Prior Auth for patients medication OZEMPIC denied by [facility] [place] via CoverMyMeds.   Reason: The policy states that this medication may be approved when:-The member has a clinical condition or needs a specific dosage form for which there is no alternative on the formulary OR-The listed formulary alternatives are not recommended based on published guidelines or clinical literature OR-The formulary alternatives will likely be ineffective or less effective for the member OR-The formulary alternatives will likely cause an adverse effect OR-The member is unable to take the required number of formulary alternatives for the given diagnosis due to a trial and inadequate treatment response or contraindication OR-The member has tried and failed the required number of formulary alternatives. Based on the policy and the information we have, your request is denied. We did not receive any documentation that you meet any of the criteria outlined above. Formulary alternative(s) are Victoza, Trulicity. Requirement: 3 in a class with 3 or more alternatives, 2 in a class with 2 alternatives, or 1 in a class with only 1 alternative. Please refer to your plan documents for a complete list of alternatives. Note: Formulary alternatives may require a prior authorization. Your prescriber will be responsible for determining what alternative is appropriate for you.  CoverMyMeds Key: [id number]

## 2022-06-30 ENCOUNTER — Ambulatory Visit
Admission: RE | Admit: 2022-06-30 | Discharge: 2022-06-30 | Disposition: A | Discharge status: Home / Self Care | Payer: 59 | Source: Ambulatory Visit | Attending: Family | Admitting: Family

## 2022-06-30 DIAGNOSIS — M5416 Radiculopathy, lumbar region: Secondary | ICD-10-CM

## 2022-07-01 MED ORDER — TRULICITY 0.75 MG/0.5ML ~~LOC~~ SOAJ: 0.7500 mg | SUBCUTANEOUS | 0 refills | Status: DC

## 2022-07-01 NOTE — Telephone Encounter (Signed)
Patient calls nurse line in regards to Ozempic.   Advised this medication was denied by her insurance.   She states she was on Trulicity but wanted to see if her insurance would cover Ozempic.   Can we please resend in Trulicty for patient.

## 2022-07-01 NOTE — Addendum Note (Signed)
Addended by: Tanya Nones on: 07/01/2022 01:12 PM   Modules accepted: Orders

## 2022-07-02 ENCOUNTER — Telehealth: Payer: Self-pay

## 2022-07-02 NOTE — Telephone Encounter (Signed)
A Prior Authorization was initiated for this patients TRULICITY through CoverMyMeds.   Key: [id number]

## 2022-07-03 ENCOUNTER — Other Ambulatory Visit (HOSPITAL_COMMUNITY): Payer: Self-pay

## 2022-07-03 ENCOUNTER — Other Ambulatory Visit: Payer: Self-pay | Admitting: Family

## 2022-07-03 DIAGNOSIS — M5416 Radiculopathy, lumbar region: Secondary | ICD-10-CM

## 2022-07-03 MED ORDER — SEMAGLUTIDE(0.25 OR 0.5MG/DOS) 2 MG/3ML ~~LOC~~ SOPN: 0.2500 mg | PEN_INJECTOR | SUBCUTANEOUS | 0 refills | Status: DC

## 2022-07-03 NOTE — Addendum Note (Signed)
Addended by: Kathrin Ruddy on: 07/03/2022 04:29 PM   Modules accepted: Orders

## 2022-07-03 NOTE — Telephone Encounter (Signed)
Contacted patient who immediately expressed high-level frustration.  She reports lack of blood sugar control since stopping Ozempic (semaglutide) and not having access to any alternative.   She also reports a prednisone burst which has worsened her glydemic control.   I shared that I called to ask her if she would be willing to contact customer service at her [employer] and discuss an appeal as we have tried her 'formulary preferred GLP - Trulicity" and SHE has been on Metformin in the last year (stopped due to good results with Ozempic in August 2023).  She was not interested in calling her insurance company for any reason.  She stated a higher level of interest in calling "[facility] [employer]" to report the lack of care.   I stepped back to her previous control, asked her the dose of Ozempic (semaglutide) that she was previously taking. She reported 0.25 mg weekly.  I agreed to supply 2 sample pens (4 months of therapy to allow time for an option for resolution to be determined).   Medication Samples have been provided for the patient to pick-up tomorrow 4/11.  Labeled and placed in refrigerator.  Drug name: Ozempic (semaglutide) 0.25/0.5mg  pens       Qty: 2  LOT: [id number]  Exp.Date: 10/23/2023  Dosing instructions: resume 0.25mg  once weekly.  Additional pen needle tips provided #8  The patient has been instructed regarding the correct time, dose, and frequency of taking this medication, including desired effects and most common side effects.   Madelon Lips 4:22 PM 07/03/2022

## 2022-07-03 NOTE — Telephone Encounter (Addendum)
Prior Auth for patients medication TRULICITY denied by [facility] [place] [facility] [employer] via CoverMyMeds.   Reason: Your plan only covers this drug when you have tried another drug that your plan covers (preferred drug) and it did not work well for you.   (FULL DETAILED DENIAL LETTER SCANNED TO MEDIA)  Honestly not sure what her insurance wants at this point. Maybe she needs to fail metformin? suggest patient call her insurance to see what medications are covered for the condition.  CoverMyMeds Key: [id number]

## 2022-07-04 NOTE — Telephone Encounter (Signed)
Medication samples given to patient.

## 2022-07-08 ENCOUNTER — Telehealth: Payer: Self-pay

## 2022-07-08 NOTE — Telephone Encounter (Signed)
Patients insurance calls nurse line in regards to Trulicity PA.   She reports the PA was denied due to PA not stating she had tried/failed alternatives. She reports he has tried/failed Metformin.   She ask I redo PA and mark as urgent or call the provider line at [phone]. I called the provider line, however they were experiencing high call volumes and the line was disconnected.   I resubmitted a PA on covermymeds and marked it urgent.   Key: [id number]  Will check back for status.

## 2022-07-08 NOTE — Telephone Encounter (Signed)
Reviewed and agree with Dr Koval's plan.   

## 2022-07-09 MED ORDER — TRULICITY 0.75 MG/0.5ML ~~LOC~~ SOAJ: 0.7500 mg | SUBCUTANEOUS | 0 refills | Status: DC

## 2022-07-09 NOTE — Telephone Encounter (Signed)
Spoke with pharmacy.  [facility] is not contracted with [facility].   [facility]   Patient is aware of medication approval.

## 2022-07-09 NOTE — Addendum Note (Signed)
Addended by: Steva Colder on: 07/09/2022 02:47 PM   Modules accepted: Orders

## 2022-07-09 NOTE — Telephone Encounter (Signed)
   Can we send in Trulicty now that the medication has been approved.   Thank you!

## 2022-07-10 ENCOUNTER — Other Ambulatory Visit: Payer: Self-pay

## 2022-07-10 MED ORDER — ONDANSETRON HCL 8 MG PO TABS: 8.0000 mg | ORAL_TABLET | Freq: Three times a day (TID) | ORAL | 0 refills | Status: DC | PRN

## 2022-07-11 ENCOUNTER — Ambulatory Visit: Payer: PRIVATE HEALTH INSURANCE | Admitting: Orthopedic Surgery

## 2022-07-11 ENCOUNTER — Encounter: Payer: Self-pay | Admitting: Physical Medicine and Rehabilitation

## 2022-07-11 ENCOUNTER — Ambulatory Visit (INDEPENDENT_AMBULATORY_CARE_PROVIDER_SITE_OTHER): Payer: PRIVATE HEALTH INSURANCE | Admitting: Physical Medicine and Rehabilitation

## 2022-07-11 DIAGNOSIS — M47819 Spondylosis without myelopathy or radiculopathy, site unspecified: Secondary | ICD-10-CM

## 2022-07-11 DIAGNOSIS — G8929 Other chronic pain: Secondary | ICD-10-CM

## 2022-07-11 DIAGNOSIS — M545 Low back pain, unspecified: Secondary | ICD-10-CM

## 2022-07-11 DIAGNOSIS — M47816 Spondylosis without myelopathy or radiculopathy, lumbar region: Secondary | ICD-10-CM

## 2022-07-11 NOTE — Progress Notes (Signed)
Functional Pain Scale - descriptive words and definitions  Distressing (6)    Pain is present/unable to complete most ADLs limited by pain/sleep is difficult and active distraction is only marginal. Moderate range order  Average Pain  varies  Lower back pain that can radiate in both legs, worse on right. MRI review

## 2022-07-11 NOTE — Progress Notes (Signed)
Lisa Sullivan - 57 y.o. female MRN [id number]  Date of birth: 09-Oct-1965  Office Visit Note: Visit Date: 07/11/2022 PCP: [name], DO Referred by: [name], DO  Subjective: Chief Complaint  Patient presents with   Lower Back - Pain   HPI: Lisa Sullivan is a 57 y.o. female who comes in today per the request of Barnie Del, NP for evaluation of chronic, worsening and severe bilateral lower back pain, left greater right, intermittent cramping sensation down both legs.. Pain ongoing for several months, worsens with activity, standing and walking. Pain becomes severe with household tasks such as mopping and sweeping. She describes pain as sore and aching, currently rates as 7 out of 10. Some relief of pain with home exercise regimen, rest and use of medications. Minimal relief of pain with Norco and short course of oral Prednisone. No history of formal physical therapy. Recent lumbar MRI imaging exhibits moderate degenerative facet spurring at L3-L4, L4-L5 and L5-S1, more prominent at L3-L4. No neural impingement noted, no high grade spinal canal stenosis noted. Patient does have history of multiple falls and chronic bilateral knee pain. She is concerned frequent falls have worsened her chronic conditions. Last fall in August of 2023. Patient currently works at daycare. Patient denies focal weakness, numbness and tingling. No recent trauma or falls.     [place] Disability Index Score 32% 10 to 20 (40%) moderate disability: The patient experiences more pain and difficulty with sitting, lifting and standing. Travel and social life are more difficult, and they may be disabled from work. Personal care, sexual activity and sleeping are not grossly affected, and the patient can usually be managed by conservative means.  Review of Systems  Musculoskeletal:  Positive for back pain.  Neurological:  Negative for tingling, sensory change, focal weakness and weakness.  All other systems  reviewed and are negative.  Otherwise per HPI.  Assessment & Plan: Visit Diagnoses:    ICD-10-CM   1. Chronic bilateral low back pain without sciatica  M54.50 Ambulatory referral to Physical Medicine Rehab   G89.29     2. Spondylosis without myelopathy or radiculopathy  M47.819 Ambulatory referral to Physical Medicine Rehab    3. Facet arthropathy, lumbar  M47.816 Ambulatory referral to Physical Medicine Rehab       Plan: Findings:  Chronic, worsening and severe bilateral lower back pain, left greater than right. Cramping pains to bilateral legs. Patient continues to have severe pain despite good conservative therapies such as home exercise regimen, rest and use of medications. Patients clinical presentation and exam are consistent with facet mediated pain. There is moderate degenerative facet spurring at L3-L4, L4-L5 and L5-S1 on recent lumbar MRI imaging. Cramping sensation to bilateral legs could be more of facet joint syndrome or directly related to chronic knee issues. Next step is to perform diagnostic bilateral L3-L4 and L4-L5 medial branch blocks under fluoroscopic guidance. If good relief of pain with facet blocks we discussed possible longer sustained pain relief with radiofrequency ablation. I provided patient with educational material regarding ablation procedure to take home and review. Would also consider formal physical therapy. No red flag symptoms noted upon exam today.   Patient continues with directed home exercise program. Current medication management is not beneficial in increasing her functional status. Please note that procedures are done as part of a comprehensive orthopedic and pain management program with access to in-house orthopedics, spine surgery and physical therapy as well as access to [facility] biopsychosocial counseling  if needed.     Meds & Orders: No orders of the defined types were placed in this encounter.   Orders Placed This Encounter   Procedures   Ambulatory referral to Physical Medicine Rehab    Follow-up: Return for Bilateral L3-L4 and L4-L5 medial branch blocks.   Procedures: No procedures performed      Clinical History: MRI LUMBAR SPINE WITHOUT CONTRAST   TECHNIQUE: Multiplanar, multisequence MR imaging of the lumbar spine was performed. No intravenous contrast was administered.   COMPARISON:  None Available.   FINDINGS: Segmentation:  Standard.   Alignment:  Mild dextrocurvature.   Vertebrae:  No fracture, evidence of discitis, or bone lesion.   Conus medullaris and cauda equina: Conus extends to the L1 level. Conus and cauda equina appear normal.   Paraspinal and other soft tissues: Negative for perispinal mass. Mild degenerative periarticular edema at L3-4 and L5-S1.   Disc levels:   L2-L3: Mild disc bulging with tiny left foraminal protrusion. No neural impingement   L3-L4: Mild disc bulging. Degenerative facet spurring to a moderate degree. No neural impingement   L4-L5: At least moderate degenerative facet spurring with mild anterolisthesis. Mild disc bulging. No neural impingement   L5-S1:At least moderate degenerative facet spurring. Preserved disc height and hydration. No neural impingement.   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 No neural impingement.     Electronically Signed   By: Tiburcio Pea M.D.   On: 07/03/2022 07:27   She reports that she has been smoking cigarettes. She started smoking about 39 years ago. She has been smoking an average of .25 packs per day. She has been exposed to tobacco smoke. She has never used smokeless tobacco.  Recent Labs    11/05/21 1602 03/12/22 1503  HGBA1C 6.4 6.6    Objective:  VS:  HT:    WT:   BMI:     BP:   HR: bpm  TEMP: ( )  RESP:  Physical Exam Vitals and nursing note reviewed.  HENT:     Head: Normocephalic and  atraumatic.     Right Ear: External ear normal.     Left Ear: External ear normal.     Nose: Nose normal.     Mouth/Throat:     Mouth: Mucous membranes are moist.  Eyes:     Extraocular Movements: Extraocular movements intact.  Cardiovascular:     Rate and Rhythm: Normal rate.     Pulses: Normal pulses.  Pulmonary:     Effort: Pulmonary effort is normal.  Abdominal:     General: Abdomen is flat. There is no distension.  Musculoskeletal:        General: Tenderness present.     Cervical back: Normal range of motion.     Comments: Patient rises from seated position to standing without difficulty. Concordant low back pain with facet loading, lumbar spine extension and rotation. 5/5 strength noted with bilateral hip flexion, knee flexion/extension, ankle dorsiflexion/plantarflexion and EHL. No clonus noted bilaterally. No pain upon palpation of greater trochanters. No pain with internal/external rotation of bilateral hips. Sensation intact bilaterally. Negative slump test bilaterally. Ambulates without aid, gait steady.      Skin:    General: Skin is warm and dry.     Capillary Refill: Capillary refill takes less than 2 seconds.  Neurological:     General: No focal deficit present.     Mental Status: She is alert  and oriented to person, place, and time.  Psychiatric:        Mood and Affect: Mood normal.        Behavior: Behavior normal.     Ortho Exam  Imaging: No results found.  Past Medical/Family/Surgical/Social History: Medications & Allergies reviewed per EMR, new medications updated. Patient Active Problem List   Diagnosis Date Noted   Episodic lightheadedness 12/10/2021   Chronic obstructive pulmonary disease 04/25/2021   Gastroesophageal reflux disease 11/10/2020   Anxiety 05/26/2020   Hyperlipidemia associated with type 2 diabetes mellitus 10/20/2019   Insomnia 10/06/2019   Type 2 diabetes mellitus without complications 06/18/2016   Depression 01/23/2015    Hypertension 02/28/2011   Goiter 02/28/2011   OBESITY, UNSPECIFIED 03/27/2010   SMOKER 03/27/2010   DYSHIDROTIC ECZEMA, HANDS 03/27/2010   Past Medical History:  Diagnosis Date   Diabetes mellitus without complication    Gall stone    Hyperlipidemia    Hypertension    Family History  Problem Relation Age of Onset   Liver cancer Mother    Diabetes Mother    Stroke Father    Breast cancer Paternal Grandmother    Stomach cancer Other    Rectal cancer Other    Esophageal cancer Other    Colon polyps Other    Colon cancer Other    Past Surgical History:  Procedure Laterality Date   CESAREAN SECTION     CHOLECYSTECTOMY     TUBAL LIGATION     Social History   Occupational History   Not on file  Tobacco Use   Smoking status: Some Days    Packs/day: .25    Types: Cigarettes    Start date: 03/26/1983    Passive exposure: Past   Smokeless tobacco: Never   Tobacco comments:    Less than five cigarettes a day.  Vaping Use   Vaping Use: Never used  Substance and Sexual Activity   Alcohol use: Yes    Comment: socially    Drug use: No   Sexual activity: Not on file

## 2022-07-15 ENCOUNTER — Other Ambulatory Visit: Payer: Self-pay | Admitting: Physical Medicine and Rehabilitation

## 2022-07-15 ENCOUNTER — Telehealth: Payer: Self-pay

## 2022-07-15 MED ORDER — ZOLPIDEM TARTRATE 10 MG PO TABS: ORAL_TABLET | ORAL | 0 refills | Status: DC

## 2022-07-15 NOTE — Telephone Encounter (Signed)
Patient is scheduled for injection 07/25/22. Needs pre-procedure [facility]

## 2022-07-16 NOTE — Telephone Encounter (Signed)
IC advised.  

## 2022-07-25 ENCOUNTER — Ambulatory Visit (INDEPENDENT_AMBULATORY_CARE_PROVIDER_SITE_OTHER): Payer: 59 | Admitting: Physical Medicine and Rehabilitation

## 2022-07-25 ENCOUNTER — Other Ambulatory Visit: Payer: Self-pay

## 2022-07-25 VITALS — BP 122/87 | HR 93

## 2022-07-25 DIAGNOSIS — M47819 Spondylosis without myelopathy or radiculopathy, site unspecified: Secondary | ICD-10-CM

## 2022-07-25 MED ORDER — BUPIVACAINE HCL 0.5 % IJ SOLN
3.0000 mL | Freq: Once | INTRAMUSCULAR | Status: AC
  Administered 2022-07-25: 3 mL

## 2022-07-25 NOTE — Patient Instructions (Signed)

## 2022-07-25 NOTE — Progress Notes (Signed)
Functional Pain Scale - descriptive words and definitions  Unmanageable (7)  Pain interferes with normal ADL's/nothing seems to help/sleep is very difficult/active distractions are very difficult to concentrate on. Severe range order  Average Pain  varies on activity   +Driver, -BT, -Dye Allergies.  Lower back pain on both sides with radiation into the left leg.

## 2022-08-05 ENCOUNTER — Other Ambulatory Visit: Payer: Self-pay

## 2022-08-05 DIAGNOSIS — I10 Essential (primary) hypertension: Secondary | ICD-10-CM

## 2022-08-05 MED ORDER — HYDROCHLOROTHIAZIDE 25 MG PO TABS: ORAL_TABLET | ORAL | 3 refills | Status: DC

## 2022-08-05 NOTE — Telephone Encounter (Signed)
Patient calls nurse line. She is requesting that [facility] [address]. Updated pended medication to this pharmacy.   Veronda Prude, RN

## 2022-08-06 ENCOUNTER — Other Ambulatory Visit: Payer: Self-pay | Admitting: Student

## 2022-08-06 NOTE — Progress Notes (Signed)
Lisa Sullivan - 57 y.o. female MRN [id number]  Date of birth: 1965-07-14  Office Visit Note: Visit Date: 07/25/2022 PCP: [name], DO Referred by: [name], DO  Subjective: Chief Complaint  Patient presents with   Lower Back - Pain   HPI:  Lisa Sullivan is a 57 y.o. female who comes in today at the request of Ellin Goodie, FNP for planned Bilateral  L3-4 and L4-5 Lumbar facet/medial branch block with fluoroscopic guidance.  The patient has failed conservative care including home exercise, medications, time and activity modification.  This injection will be diagnostic and hopefully therapeutic.  Please see requesting physician notes for further details and justification.  Exam has shown concordant pain with facet joint loading.   ROS Otherwise per HPI.  Assessment & Plan: Visit Diagnoses:    ICD-10-CM   1. Spondylosis without myelopathy or radiculopathy  M47.819 XR C-ARM NO REPORT    Facet Injection    bupivacaine (MARCAINE) 0.5 % (with pres) injection 3 mL      Plan: No additional findings.   Meds & Orders:  Meds ordered this encounter  Medications   bupivacaine (MARCAINE) 0.5 % (with pres) injection 3 mL    Orders Placed This Encounter  Procedures   Facet Injection   XR C-ARM NO REPORT    Follow-up: Return for Review Pain Diary.   Procedures: No procedures performed  Lumbar Diagnostic Facet Joint Nerve Block with Fluoroscopic Guidance   Patient: Lisa Sullivan      Date of Birth: 11-Mar-1966 MRN: [id number] PCP: [name], DO      Visit Date: 07/25/2022   Universal Protocol:    Date/Time: 05/14/249:29 AM  Consent Given By: the patient  Position: PRONE  Additional Comments: Vital signs were monitored before and after the procedure. Patient was prepped and draped in the usual sterile fashion. The correct patient, procedure, and site was verified.   Injection Procedure Details:   Procedure diagnoses:  1. Spondylosis without  myelopathy or radiculopathy      Meds Administered:  Meds ordered this encounter  Medications   bupivacaine (MARCAINE) 0.5 % (with pres) injection 3 mL     Laterality: Bilateral  Location/Site: L3-L4, L2 and L3 medial branches and L4-L5, L3 and L4 medial branches  Needle: 5.0 in., 25 ga.  Short bevel or Quincke spinal needle  Needle Placement: Oblique pedical  Findings:   -Comments: There was excellent flow of contrast along the articular pillars without intravascular flow.  Procedure Details: The fluoroscope beam is vertically oriented in AP and then obliqued 15 to 20 degrees to the ipsilateral side of the desired nerve to achieve the "Scotty dog" appearance.  The skin over the target area of the junction of the superior articulating process and the transverse process (sacral ala if blocking the L5 dorsal rami) was locally anesthetized with a 1 ml volume of 1% Lidocaine without Epinephrine.  The spinal needle was inserted and advanced in a trajectory view down to the target.   After contact with periosteum and negative aspirate for blood and CSF, correct placement without intravascular or epidural spread was confirmed by injecting 0.5 ml. of Isovue-250.  A spot radiograph was obtained of this image.    Next, a 0.5 ml. volume of the injectate described above was injected. The needle was then redirected to the other facet joint nerves mentioned above if needed.  Prior to the procedure, the patient was given a Pain Diary which was completed for baseline measurements.  After the procedure, the patient rated their pain every 30 minutes and will continue rating at this frequency for a total of 5 hours.  The patient has been asked to complete the Diary and return to [place] by mail, fax or hand delivered as soon as possible.   Additional Comments:  No complications occurred Dressing: 2 x 2 sterile gauze and Band-Aid    Post-procedure details: Patient was observed during the  procedure. Post-procedure instructions were reviewed.  Patient left the clinic in stable condition.   Clinical History: MRI LUMBAR SPINE WITHOUT CONTRAST   TECHNIQUE: Multiplanar, multisequence MR imaging of the lumbar spine was performed. No intravenous contrast was administered.   COMPARISON:  None Available.   FINDINGS: Segmentation:  Standard.   Alignment:  Mild dextrocurvature.   Vertebrae:  No fracture, evidence of discitis, or bone lesion.   Conus medullaris and cauda equina: Conus extends to the L1 level. Conus and cauda equina appear normal.   Paraspinal and other soft tissues: Negative for perispinal mass. Mild degenerative periarticular edema at L3-4 and L5-S1.   Disc levels:   L2-L3: Mild disc bulging with tiny left foraminal protrusion. No neural impingement   L3-L4: Mild disc bulging. Degenerative facet spurring to a moderate degree. No neural impingement   L4-L5: At least moderate degenerative facet spurring with mild anterolisthesis. Mild disc bulging. No neural impingement   L5-S1:At least moderate degenerative facet spurring. Preserved disc height and hydration. No neural impingement.   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 No neural impingement.     Electronically Signed   By: Tiburcio Pea M.D.   On: 07/03/2022 07:27     Objective:  VS:  HT:    WT:   BMI:     BP:122/87  HR:93bpm  TEMP: ( )  RESP:  Physical Exam Vitals and nursing note reviewed.  Constitutional:      General: She is not in acute distress.    Appearance: Normal appearance. She is obese. She is not ill-appearing.  HENT:     Head: Normocephalic and atraumatic.     Right Ear: External ear normal.     Left Ear: External ear normal.  Eyes:     Extraocular Movements: Extraocular movements intact.  Cardiovascular:     Rate and Rhythm: Normal rate.     Pulses: Normal  pulses.  Pulmonary:     Effort: Pulmonary effort is normal. No respiratory distress.  Abdominal:     General: There is no distension.     Palpations: Abdomen is soft.  Musculoskeletal:        General: Tenderness present.     Cervical back: Neck supple.     Right lower leg: No edema.     Left lower leg: No edema.     Comments: Patient has good distal strength with no pain over the greater trochanters.  No clonus or focal weakness.  Skin:    Findings: No erythema, lesion or rash.  Neurological:     General: No focal deficit present.     Mental Status: She is alert and oriented to person, place, and time.     Sensory: No sensory deficit.     Motor: No weakness or abnormal muscle tone.     Coordination: Coordination normal.  Psychiatric:        Mood and Affect: Mood normal.        Behavior: Behavior normal.  Imaging: No results found.

## 2022-08-06 NOTE — Procedures (Signed)
Lumbar Diagnostic Facet Joint Nerve Block with Fluoroscopic Guidance   Patient: Lisa Sullivan      Date of Birth: 25-May-1965 MRN: [id number] PCP: [name], DO      Visit Date: 07/25/2022   Universal Protocol:    Date/Time: 05/14/249:29 AM  Consent Given By: the patient  Position: PRONE  Additional Comments: Vital signs were monitored before and after the procedure. Patient was prepped and draped in the usual sterile fashion. The correct patient, procedure, and site was verified.   Injection Procedure Details:   Procedure diagnoses:  1. Spondylosis without myelopathy or radiculopathy      Meds Administered:  Meds ordered this encounter  Medications   bupivacaine (MARCAINE) 0.5 % (with pres) injection 3 mL     Laterality: Bilateral  Location/Site: L3-L4, L2 and L3 medial branches and L4-L5, L3 and L4 medial branches  Needle: 5.0 in., 25 ga.  Short bevel or Quincke spinal needle  Needle Placement: Oblique pedical  Findings:   -Comments: There was excellent flow of contrast along the articular pillars without intravascular flow.  Procedure Details: The fluoroscope beam is vertically oriented in AP and then obliqued 15 to 20 degrees to the ipsilateral side of the desired nerve to achieve the "Scotty dog" appearance.  The skin over the target area of the junction of the superior articulating process and the transverse process (sacral ala if blocking the L5 dorsal rami) was locally anesthetized with a 1 ml volume of 1% Lidocaine without Epinephrine.  The spinal needle was inserted and advanced in a trajectory view down to the target.   After contact with periosteum and negative aspirate for blood and CSF, correct placement without intravascular or epidural spread was confirmed by injecting 0.5 ml. of Isovue-250.  A spot radiograph was obtained of this image.    Next, a 0.5 ml. volume of the injectate described above was injected. The needle was then redirected to the  other facet joint nerves mentioned above if needed.  Prior to the procedure, the patient was given a Pain Diary which was completed for baseline measurements.  After the procedure, the patient rated their pain every 30 minutes and will continue rating at this frequency for a total of 5 hours.  The patient has been asked to complete the Diary and return to [place] by mail, fax or hand delivered as soon as possible.   Additional Comments:  No complications occurred Dressing: 2 x 2 sterile gauze and Band-Aid    Post-procedure details: Patient was observed during the procedure. Post-procedure instructions were reviewed.  Patient left the clinic in stable condition.

## 2022-08-10 DIAGNOSIS — Z720 Tobacco use: Secondary | ICD-10-CM | POA: Diagnosis not present

## 2022-08-10 DIAGNOSIS — Z809 Family history of malignant neoplasm, unspecified: Secondary | ICD-10-CM | POA: Diagnosis not present

## 2022-08-10 DIAGNOSIS — Z823 Family history of stroke: Secondary | ICD-10-CM | POA: Diagnosis not present

## 2022-08-10 DIAGNOSIS — M545 Low back pain, unspecified: Secondary | ICD-10-CM | POA: Diagnosis not present

## 2022-08-10 DIAGNOSIS — Z833 Family history of diabetes mellitus: Secondary | ICD-10-CM | POA: Diagnosis not present

## 2022-08-10 DIAGNOSIS — K219 Gastro-esophageal reflux disease without esophagitis: Secondary | ICD-10-CM | POA: Diagnosis not present

## 2022-08-10 DIAGNOSIS — M1990 Unspecified osteoarthritis, unspecified site: Secondary | ICD-10-CM | POA: Diagnosis not present

## 2022-08-10 DIAGNOSIS — R030 Elevated blood-pressure reading, without diagnosis of hypertension: Secondary | ICD-10-CM | POA: Diagnosis not present

## 2022-08-10 DIAGNOSIS — E119 Type 2 diabetes mellitus without complications: Secondary | ICD-10-CM | POA: Diagnosis not present

## 2022-08-10 DIAGNOSIS — Z882 Allergy status to sulfonamides status: Secondary | ICD-10-CM | POA: Diagnosis not present

## 2022-08-10 DIAGNOSIS — Z6836 Body mass index (BMI) 36.0-36.9, adult: Secondary | ICD-10-CM | POA: Diagnosis not present

## 2022-08-10 DIAGNOSIS — Z7985 Long-term (current) use of injectable non-insulin antidiabetic drugs: Secondary | ICD-10-CM | POA: Diagnosis not present

## 2022-08-22 ENCOUNTER — Other Ambulatory Visit: Payer: Self-pay | Admitting: Family Medicine

## 2022-08-22 ENCOUNTER — Other Ambulatory Visit: Payer: Self-pay

## 2022-08-22 DIAGNOSIS — Z1231 Encounter for screening mammogram for malignant neoplasm of breast: Secondary | ICD-10-CM

## 2022-08-22 DIAGNOSIS — M1711 Unilateral primary osteoarthritis, right knee: Secondary | ICD-10-CM

## 2022-08-27 ENCOUNTER — Ambulatory Visit
Admission: RE | Admit: 2022-08-27 | Discharge: 2022-08-27 | Disposition: A | Discharge status: Home / Self Care | Payer: 59 | Source: Ambulatory Visit | Attending: Family Medicine | Admitting: Family Medicine

## 2022-08-27 ENCOUNTER — Other Ambulatory Visit: Payer: Self-pay

## 2022-08-27 ENCOUNTER — Ambulatory Visit: Payer: 59 | Admitting: Orthopedic Surgery

## 2022-08-27 DIAGNOSIS — Z1231 Encounter for screening mammogram for malignant neoplasm of breast: Secondary | ICD-10-CM

## 2022-08-27 DIAGNOSIS — M1711 Unilateral primary osteoarthritis, right knee: Secondary | ICD-10-CM

## 2022-08-28 ENCOUNTER — Telehealth: Payer: Self-pay | Admitting: Orthopedic Surgery

## 2022-08-28 NOTE — Telephone Encounter (Signed)
Patient called stating she had a gel injection yesterday and her knee is in a lot of pain Would like a call back in regards to if this is normal or not.

## 2022-08-28 NOTE — Telephone Encounter (Signed)
I called and sw pt to advise that this is common to have increased discomfort for a day or two following these injections. To scale back on activity no strenuous activity and can ice with OTC pain reliever as per protocol  and let us know if this has not resolved in the next day or two. Pt voiced understanding and will call with any questions.

## 2022-09-01 NOTE — Progress Notes (Unsigned)
  SUBJECTIVE:   CHIEF COMPLAINT / HPI:   Presents today for Pap. Last pap completed on 2019.   Lung cancer screening?  PERTINENT  PMH / PSH: HTN, T2DM, HLD, COPD, depression, GERD  Past Medical History:  Diagnosis Date   Diabetes mellitus without complication (HCC)    Gall stone    Hyperlipidemia    Hypertension     Patient Care Team: [name], DO as PCP - General (Family Medicine) OBJECTIVE:  There were no vitals taken for this visit. Physical Exam   ASSESSMENT/PLAN:  There are no diagnoses linked to this encounter. No follow-ups on file. [name], DO 09/01/2022, 2:12 PM PGY-***, [facility] {    This will disappear when note is signed, click to select method of visit    :1}

## 2022-09-02 ENCOUNTER — Other Ambulatory Visit (HOSPITAL_COMMUNITY)
Admission: RE | Admit: 2022-09-02 | Discharge: 2022-09-02 | Disposition: A | Discharge status: Home / Self Care | Payer: 59 | Source: Ambulatory Visit | Attending: Family Medicine | Admitting: Family Medicine

## 2022-09-02 ENCOUNTER — Encounter: Payer: Self-pay | Admitting: Student

## 2022-09-02 ENCOUNTER — Ambulatory Visit (INDEPENDENT_AMBULATORY_CARE_PROVIDER_SITE_OTHER): Payer: 59 | Admitting: Student

## 2022-09-02 VITALS — BP 128/88 | HR 70 | Wt 217.2 lb

## 2022-09-02 DIAGNOSIS — E119 Type 2 diabetes mellitus without complications: Secondary | ICD-10-CM

## 2022-09-02 DIAGNOSIS — Z124 Encounter for screening for malignant neoplasm of cervix: Secondary | ICD-10-CM | POA: Diagnosis not present

## 2022-09-02 LAB — POCT GLYCOSYLATED HEMOGLOBIN (HGB A1C): HbA1c, POC (controlled diabetic range): 6.4 % (ref 0.0–7.0)

## 2022-09-02 MED ORDER — ONDANSETRON HCL 8 MG PO TABS: 8.0000 mg | ORAL_TABLET | Freq: Three times a day (TID) | ORAL | 0 refills | Status: DC | PRN

## 2022-09-02 NOTE — Assessment & Plan Note (Signed)
well controlled, submitted referral for new ophthalmologist for diabetic eye exam. - Last A1c:  Lab Results  Component Value Date   HGBA1C 6.4 09/02/2022  - Medications: Trulicity

## 2022-09-02 NOTE — Patient Instructions (Signed)
It was great to see you today! Thank you for choosing [facility] for your primary care. Lisa Sullivan was seen for Pap.  Today we addressed: I have refilled some medications for you.  We are checking an A1c.  I have placed a referral for you to be seen by a new ophthalmologist for diabetic eye exam.  Your mammogram that you had done was not concerning.  If you haven't already, sign up for My Chart to have easy access to your labs results, and communication with your primary care physician.  We are checking some labs today. If they are abnormal, I will call you. If they are normal, I will send you a MyChart message (if it is active) or a letter in the mail. If you do not hear about your labs in the next 2 weeks, please call the office.  Please arrive 15 minutes before your appointment to ensure smooth check in process.  We appreciate your efforts in making this happen.  Thank you for allowing me to participate in your care, Lisa Mattocks, DO 09/02/2022, 3:48 PM PGY-2, [facility]

## 2022-09-05 LAB — CYTOLOGY - PAP
Adequacy: ABSENT
Chlamydia: NEGATIVE
Comment: NEGATIVE
Comment: NEGATIVE
Comment: NEGATIVE
Comment: NORMAL
Diagnosis: NEGATIVE
High risk HPV: NEGATIVE
Neisseria Gonorrhea: NEGATIVE
Trichomonas: NEGATIVE

## 2022-09-06 ENCOUNTER — Encounter: Payer: Self-pay | Admitting: Orthopedic Surgery

## 2022-09-06 DIAGNOSIS — M1711 Unilateral primary osteoarthritis, right knee: Secondary | ICD-10-CM

## 2022-09-06 MED ORDER — HYALURONAN 88 MG/4ML IX SOSY
88.0000 mg | PREFILLED_SYRINGE | INTRA_ARTICULAR | Status: AC | PRN
  Administered 2022-09-06: 88 mg via INTRA_ARTICULAR

## 2022-09-06 NOTE — Progress Notes (Signed)
Office Visit Note   Patient: Lisa Sullivan           Date of Birth: March 16, 1966           MRN: [id number] Visit Date: 08/27/2022              Requested by: [name], DO [address] [place],  [place] [address] PCP: [name], DO  Chief Complaint  Patient presents with   Right Knee - Follow-up    Monovisc injection      HPI: Patient is a 57 year old woman with osteoarthritis of both knees.  The right knee is most symptomatic at this time.  Assessment & Plan: Visit Diagnoses:  1. Unilateral primary osteoarthritis, right knee     Plan: Patient's right knee was injected with Monovisc.  She tolerated this well.  Follow-Up Instructions: No follow-ups on file.   Ortho Exam  Patient is alert, oriented, no adenopathy, well-dressed, normal affect, normal respiratory effort. Examination patient has crepitation with range of motion of the right knee there is mild effusion there is no cellulitis.  Collaterals and cruciates are stable.  Imaging: No results found. No images are attached to the encounter.  Labs: Lab Results  Component Value Date   HGBA1C 6.4 09/02/2022   HGBA1C 6.6 03/12/2022   HGBA1C 6.4 11/05/2021   REPTSTATUS 02/26/2007 FINAL 02/24/2007   GRAMSTAIN  02/24/2007    NO WBC SEEN RARE SQUAMOUS EPITHELIAL CELLS PRESENT FEW GRAM POSITIVE COCCI IN CLUSTERS IN PAIRS   CULT  02/24/2007    MODERATE METHICILLIN RESISTANT STAPHYLOCOCCUS AUREUS Note: RIFAMPIN AND GENTAMICIN SHOULD NOT BE USED AS SINGLE DRUGS FOR TREATMENT OF STAPH INFECTIONS. Contact laboratory if Clindamycin results needed. [phone] CRITICAL RESULT CALLED TO, READ BACK BY AND VERIFIED WITH: TONY SILVIANO 02/26/07 1020 BY  SMITHERSJ   LABORGA METHICILLIN RESISTANT STAPHYLOCOCCUS AUREUS 02/24/2007     Lab Results  Component Value Date   ALBUMIN 4.3 01/20/2022   ALBUMIN 4.1 06/21/2019   ALBUMIN 4.2 09/02/2016    No results found for: "MG" No results found for: "VD25OH"  No  results found for: "PREALBUMIN"    Latest Ref Rng & Units 01/20/2022   11:22 AM 12/26/2021    2:51 PM 04/11/2021    3:17 PM  CBC EXTENDED  WBC 4.0 - 10.5 K/uL 14.9  9.1  9.0   RBC 3.87 - 5.11 MIL/uL 4.85  5.00  4.89   Hemoglobin 12.0 - 15.0 g/dL 21.3  08.6  57.8   HCT 36.0 - 46.0 % 41.7  44.2  41.0   Platelets 150 - 400 K/uL 263  288  281   NEUT# 1.7 - 7.7 K/uL 10.5  5.2    Lymph# 0.7 - 4.0 K/uL 3.1  3.1       There is no height or weight on file to calculate BMI.  Orders:  No orders of the defined types were placed in this encounter.  No orders of the defined types were placed in this encounter.    Procedures: Large Joint Inj: R knee on 09/06/2022 4:43 PM Indications: pain and diagnostic evaluation Details: 22 G 1.5 in needle, anteromedial approach  Arthrogram: No  Medications: 88 mg Hyaluronan 88 MG/4ML Outcome: tolerated well, no immediate complications Procedure, treatment alternatives, risks and benefits explained, specific risks discussed. Consent was given by the patient. Immediately prior to procedure a time out was called to verify the correct patient, procedure, equipment, support staff and site/side marked as required. Patient was prepped and  draped in the usual sterile fashion.      Clinical Data: No additional findings.  ROS:  All other systems negative, except as noted in the HPI. Review of Systems  Objective: Vital Signs: There were no vitals taken for this visit.  Specialty Comments:  MRI LUMBAR SPINE WITHOUT CONTRAST   TECHNIQUE: Multiplanar, multisequence MR imaging of the lumbar spine was performed. No intravenous contrast was administered.   COMPARISON:  None Available.   FINDINGS: Segmentation:  Standard.   Alignment:  Mild dextrocurvature.   Vertebrae:  No fracture, evidence of discitis, or bone lesion.   Conus medullaris and cauda equina: Conus extends to the L1 level. Conus and cauda equina appear normal.   Paraspinal and other  soft tissues: Negative for perispinal mass. Mild degenerative periarticular edema at L3-4 and L5-S1.   Disc levels:   L2-L3: Mild disc bulging with tiny left foraminal protrusion. No neural impingement   L3-L4: Mild disc bulging. Degenerative facet spurring to a moderate degree. No neural impingement   L4-L5: At least moderate degenerative facet spurring with mild anterolisthesis. Mild disc bulging. No neural impingement   L5-S1:At least moderate degenerative facet spurring. Preserved disc height and hydration. No neural impingement.   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 No neural impingement.     Electronically Signed   By: Tiburcio Pea M.D.   On: 07/03/2022 07:27  PMFS History: Patient Active Problem List   Diagnosis Date Noted   Episodic lightheadedness 12/10/2021   Chronic obstructive pulmonary disease (HCC) 04/25/2021   Gastroesophageal reflux disease 11/10/2020   Anxiety 05/26/2020   Hyperlipidemia associated with type 2 diabetes mellitus (HCC) 10/20/2019   Insomnia 10/06/2019   Type 2 diabetes mellitus without complications (HCC) 06/18/2016   Depression 01/23/2015   Hypertension 02/28/2011   Goiter 02/28/2011   OBESITY, UNSPECIFIED 03/27/2010   SMOKER 03/27/2010   DYSHIDROTIC ECZEMA, HANDS 03/27/2010   Past Medical History:  Diagnosis Date   Diabetes mellitus without complication (HCC)    Gall stone    Hyperlipidemia    Hypertension     Family History  Problem Relation Age of Onset   Liver cancer Mother    Diabetes Mother    Stroke Father    Breast cancer Paternal Grandmother    Stomach cancer Other    Rectal cancer Other    Esophageal cancer Other    Colon polyps Other    Colon cancer Other     Past Surgical History:  Procedure Laterality Date   CESAREAN SECTION     CHOLECYSTECTOMY     TUBAL LIGATION     Social History   Occupational  History   Not on file  Tobacco Use   Smoking status: Some Days    Packs/day: .25    Types: Cigarettes    Start date: 03/26/1983    Passive exposure: Past   Smokeless tobacco: Never   Tobacco comments:    Less than five cigarettes a day.  Vaping Use   Vaping Use: Never used  Substance and Sexual Activity   Alcohol use: Yes    Comment: socially    Drug use: No   Sexual activity: Not on file

## 2022-10-03 ENCOUNTER — Other Ambulatory Visit: Payer: Self-pay | Admitting: Student

## 2022-10-03 ENCOUNTER — Other Ambulatory Visit: Payer: Self-pay | Admitting: Family

## 2022-10-10 ENCOUNTER — Other Ambulatory Visit: Payer: Self-pay | Admitting: Student

## 2022-10-15 DIAGNOSIS — T63441A Toxic effect of venom of bees, accidental (unintentional), initial encounter: Secondary | ICD-10-CM | POA: Diagnosis not present

## 2022-10-24 ENCOUNTER — Ambulatory Visit (INDEPENDENT_AMBULATORY_CARE_PROVIDER_SITE_OTHER): Payer: 59 | Admitting: Student

## 2022-10-24 VITALS — BP 122/84 | HR 68 | Ht 64.0 in | Wt 216.2 lb

## 2022-10-24 DIAGNOSIS — R110 Nausea: Secondary | ICD-10-CM

## 2022-10-24 DIAGNOSIS — R1084 Generalized abdominal pain: Secondary | ICD-10-CM

## 2022-10-24 DIAGNOSIS — F17200 Nicotine dependence, unspecified, uncomplicated: Secondary | ICD-10-CM

## 2022-10-24 DIAGNOSIS — E119 Type 2 diabetes mellitus without complications: Secondary | ICD-10-CM | POA: Diagnosis not present

## 2022-10-24 MED ORDER — POLYETHYLENE GLYCOL 3350 17 GM/SCOOP PO POWD: 17.0000 g | Freq: Every day | ORAL | 3 refills | Status: AC

## 2022-10-24 MED ORDER — EMPAGLIFLOZIN 10 MG PO TABS: 10.0000 mg | ORAL_TABLET | Freq: Every day | ORAL | 0 refills | Status: DC

## 2022-10-24 NOTE — Progress Notes (Signed)
    SUBJECTIVE:   CHIEF COMPLAINT / HPI:   Lisa Sullivan  Nausea started when she began Trulicity. Couldn't tolerate Ozempic but she is still not feeling well. She lost about 20-30 lbs. A little bit of vomiting this morning.  No diarrhea. No fevers or chills. She has been constipated. She took Zofran at home this morning. She notices the nausea, acid reflux and upset stomach start after taking her Trulicity shots. Could not tolerate Metformin due to cramping and diarrhea No major abdominal pain  PERTINENT  PMH / PSH: COPD, HTN, T2DM, Depression/Anxiety  OBJECTIVE:   BP 122/84   Pulse 68   Ht 5\' 4"  (1.626 m)   Wt 216 lb 4 oz (98.1 kg)   SpO2 99%   BMI 37.12 kg/m   General: Well-appearing, no distress, pleasant CV: RRR Respiratory: Normal WOB on room air, no wheezing or crackles Abdominal: Soft, generalized mild tenderness without rebound or guarding.  No peritoneal signs on exam.  No palpable masses Extremities: Warm and well-perfused, no edema   ASSESSMENT/PLAN:   Nausea Nausea correlated with timing of GLP-1 injection, likely adverse effect from medication. Less likely gastroenteritis due to chronicity of symptoms Plan to discontinue Trulicity secondary to intolerance. Discussed taking Zofran sparingly, as it has been worsening her constipation.  Will obtain lipase today just to rule out pancreatitis given GLP-1 usage, although this is low on my differential given reassuring physical exam. Encourage plenty of oral fluid hydration, and to take MiraLAX 1 capful daily until normal soft bowel movement as her constipation is likely worsening her nausea Return precautions discussed should she develop intractable nausea, vomiting, fevers or chills and severe abdominal pain  Type 2 diabetes mellitus without complications (HCC) Well-controlled on review per last A1c. Discontinued Trulicity today secondary to intolerance Start SGLT2 inhibitor Jardiance 10 mg daily Follow-up  with PCP, due for A1c September 2024     Darral Dash, DO [facility]

## 2022-10-24 NOTE — Assessment & Plan Note (Signed)
Well-controlled on review per last A1c. Discontinued Trulicity today secondary to intolerance Start SGLT2 inhibitor Jardiance 10 mg daily Follow-up with PCP, due for A1c September 2024

## 2022-10-24 NOTE — Patient Instructions (Addendum)
It was great seeing you today.  As we discussed, - STOP taking Trulicity. - START taking Jardiance (Empagliflozin) daily. This is to treat your diabetes. It is generally very well tolerated. You should not have any big stomach upset like you did with Ozempic and Trulicity. - Use MiraLax 1 capful (17g) daily mixed in sugar-free juice, water. The goal is to have a soft bowel movement a day. If you do not have regular bowel movements in 3 days, increase to 2 capfuls daily.  If you develop any vomiting/nausea that is intractable, you are unable to tolerate any fluids by mouth, then please get urgent care, worsening abdominal pain and feel sweaty/weak please seek care.   If you have any questions or concerns, please feel free to call the clinic.   Have a wonderful day,  Dr. Darral Dash [facility] [phone]

## 2022-10-24 NOTE — Assessment & Plan Note (Addendum)
Nausea correlated with timing of GLP-1 injection, likely adverse effect from medication. Less likely gastroenteritis due to chronicity of symptoms Plan to discontinue Trulicity secondary to intolerance. Discussed taking Zofran sparingly, as it has been worsening her constipation.  Will obtain lipase today just to rule out pancreatitis given GLP-1 usage, although this is low on my differential given reassuring physical exam. Encourage plenty of oral fluid hydration, and to take MiraLAX 1 capful daily until normal soft bowel movement as her constipation is likely worsening her nausea Return precautions discussed should she develop intractable nausea, vomiting, fevers or chills and severe abdominal pain

## 2022-10-25 ENCOUNTER — Telehealth: Payer: Self-pay

## 2022-10-25 ENCOUNTER — Other Ambulatory Visit: Payer: Self-pay | Admitting: Student

## 2022-10-25 MED ORDER — PRAVASTATIN SODIUM 10 MG PO TABS: 20.0000 mg | ORAL_TABLET | Freq: Every day | ORAL | 3 refills | Status: DC

## 2022-10-25 NOTE — Telephone Encounter (Signed)
Patient calls nurse line regarding letter for work.   Patient reports that she is continuing to have abdominal pain and nausea.   Denies vomiting, fever.   She is still tolerating fluids.    She is asking for letter to be extended with return to work on Monday, 10/28/22.  Also asking to speak with provider regarding results.   Spoke with Dr. Melissa Noon. Transferred patient to discuss results further.   Veronda Prude, RN

## 2022-10-30 ENCOUNTER — Telehealth: Payer: Self-pay

## 2022-10-30 NOTE — Telephone Encounter (Signed)
Pharmacy Patient Advocate Encounter   Received notification from CoverMyMeds that prior authorization for Jardiance is required/requested.   PA required; PA submitted to [facility] via CoverMyMeds Key/confirmation #/EOC [id number]. Status is pending.

## 2022-10-31 NOTE — Telephone Encounter (Signed)
Pharmacy Patient Advocate Encounter  Received notification from [employer] that Prior Authorization for JARDIANCE has been APPROVED from 10/30/22 to 10/30/23.   PA #/Case ID/Reference #: [id number]

## 2022-11-27 ENCOUNTER — Telehealth: Payer: Self-pay | Admitting: Physical Medicine and Rehabilitation

## 2022-11-27 NOTE — Telephone Encounter (Signed)
Patient called needing to schedule an appointment with Dr. Alvester Morin. The number to contact patient is [phone]

## 2022-11-29 NOTE — Telephone Encounter (Signed)
Spoke with patient and she stated the pain in her back is different than before because its mostly buttock pain. OV scheduled for 12/03/22

## 2022-12-03 ENCOUNTER — Encounter (HOSPITAL_BASED_OUTPATIENT_CLINIC_OR_DEPARTMENT_OTHER): Payer: Self-pay | Admitting: Emergency Medicine

## 2022-12-03 ENCOUNTER — Other Ambulatory Visit (HOSPITAL_BASED_OUTPATIENT_CLINIC_OR_DEPARTMENT_OTHER): Payer: Self-pay

## 2022-12-03 ENCOUNTER — Other Ambulatory Visit: Payer: Self-pay

## 2022-12-03 ENCOUNTER — Ambulatory Visit: Payer: 59 | Admitting: Physical Medicine and Rehabilitation

## 2022-12-03 ENCOUNTER — Emergency Department (HOSPITAL_BASED_OUTPATIENT_CLINIC_OR_DEPARTMENT_OTHER)
Admission: EM | Admit: 2022-12-03 | Discharge: 2022-12-03 | Disposition: A | Discharge status: Home / Self Care | Payer: 59 | Attending: Emergency Medicine | Admitting: Emergency Medicine

## 2022-12-03 ENCOUNTER — Emergency Department (HOSPITAL_BASED_OUTPATIENT_CLINIC_OR_DEPARTMENT_OTHER): Payer: 59

## 2022-12-03 DIAGNOSIS — I10 Essential (primary) hypertension: Secondary | ICD-10-CM | POA: Insufficient documentation

## 2022-12-03 DIAGNOSIS — R0602 Shortness of breath: Secondary | ICD-10-CM | POA: Diagnosis not present

## 2022-12-03 DIAGNOSIS — R519 Headache, unspecified: Secondary | ICD-10-CM | POA: Diagnosis not present

## 2022-12-03 DIAGNOSIS — U071 COVID-19: Secondary | ICD-10-CM | POA: Diagnosis not present

## 2022-12-03 DIAGNOSIS — R0989 Other specified symptoms and signs involving the circulatory and respiratory systems: Secondary | ICD-10-CM | POA: Diagnosis not present

## 2022-12-03 DIAGNOSIS — R059 Cough, unspecified: Secondary | ICD-10-CM | POA: Diagnosis not present

## 2022-12-03 DIAGNOSIS — E119 Type 2 diabetes mellitus without complications: Secondary | ICD-10-CM | POA: Insufficient documentation

## 2022-12-03 DIAGNOSIS — Z79899 Other long term (current) drug therapy: Secondary | ICD-10-CM | POA: Insufficient documentation

## 2022-12-03 LAB — SARS CORONAVIRUS 2 BY RT PCR: SARS Coronavirus 2 by RT PCR: POSITIVE — AB

## 2022-12-03 MED ORDER — IBUPROFEN 400 MG PO TABS
600.0000 mg | ORAL_TABLET | Freq: Once | ORAL | Status: AC
  Administered 2022-12-03: 600 mg via ORAL
  Filled 2022-12-03: qty 1

## 2022-12-03 MED ORDER — PROMETHAZINE-DM 6.25-15 MG/5ML PO SYRP: 5.0000 mL | ORAL_SOLUTION | Freq: Four times a day (QID) | ORAL | 0 refills | Status: AC | PRN

## 2022-12-03 MED ORDER — AEROCHAMBER PLUS FLO-VU MEDIUM MISC
1.0000 | Freq: Once | Status: AC
  Administered 2022-12-03: 1
  Filled 2022-12-03: qty 1

## 2022-12-03 MED ORDER — ALBUTEROL SULFATE HFA 108 (90 BASE) MCG/ACT IN AERS
4.0000 | INHALATION_SPRAY | Freq: Once | RESPIRATORY_TRACT | Status: AC
  Administered 2022-12-03: 4 via RESPIRATORY_TRACT
  Filled 2022-12-03: qty 6.7

## 2022-12-03 MED ORDER — PREDNISONE 10 MG PO TABS: 40.0000 mg | ORAL_TABLET | Freq: Every day | ORAL | 0 refills | Status: AC

## 2022-12-03 NOTE — ED Notes (Addendum)
RT Note: Patient educated on the use of an Albuterol Inhaler and spacer for the relief of a tight chest and congested feeling.  Patient tolerated well

## 2022-12-03 NOTE — ED Notes (Signed)
Patient verbalizes understanding of discharge instructions. Opportunity for questioning and answers were provided. Patient discharged from ED.  °

## 2022-12-03 NOTE — ED Triage Notes (Signed)
Pt arrives to ED with c/o SOB, chest soreness, cough, body aches, congestion, headache.

## 2022-12-03 NOTE — Discharge Instructions (Addendum)
You tested positive for covid 19. Since you have COPD I think it is reasonable for you to keep a close eye on your symptoms and use your albuterol inhaler as needed.  I have written a prescription for a low-dose of prednisone to take 5 days (handed to patient).  Viral Illness TREATMENT  Treatment is directed at relieving symptoms. There is no cure. Antibiotics are not effective, because the infection is caused by a virus, not by bacteria. Treatment may include:  Increased fluid intake. Sports drinks offer valuable electrolytes, sugars, and fluids.  Breathing heated mist or steam (vaporizer or shower).  Eating chicken soup or other clear broths, and maintaining good nutrition.  Getting plenty of rest.  Using gargles or lozenges for comfort.  Increasing usage of your inhaler if you have asthma.  Return to work when your temperature has returned to normal.  Gargle warm salt water and spit it out for sore throat. Take benadryl to decrease sinus secretions. Continue to alternate between Tylenol and ibuprofen for pain and fever control.  Follow Up: Follow up with your primary care doctor in 5-7 days for recheck of ongoing symptoms.  Return to emergency department for emergent changing or worsening of symptoms.

## 2022-12-03 NOTE — ED Provider Notes (Signed)
[facility] EMERGENCY DEPARTMENT AT [facility] Provider Note   CSN: [id number] Arrival date & time: 12/03/22  4098     History  Chief Complaint  Patient presents with   Shortness of Breath    Lisa Sullivan is a 57 y.o. female.   Shortness of Breath  Patient is a 57 year old female with a past medical history significant for HTN, DM2, HLD, gallstones  She presents emergency room today with complaints of shortness of breath and cough for the past 2 days.  She states she has bodyaches and has some sinus congestion as well.  Has taken no medications today.  She denies any chest pain now but states that she feels sore when she coughs for an extended period of time.     Home Medications Prior to Admission medications   Medication Sig Start Date End Date Taking? Authorizing Provider  predniSONE (DELTASONE) 10 MG tablet Take 4 tablets (40 mg total) by mouth daily for 5 days. 12/03/22 12/08/22 Yes Mally Gavina S, PA  promethazine-dextromethorphan (PROMETHAZINE-DM) 6.25-15 MG/5ML syrup Take 5 mLs by mouth 4 (four) times daily as needed for cough. 12/03/22  Yes Satcha Storlie S, PA  TRULICITY 0.75 MG/0.5ML SOPN Inject 0.75 mg into the skin once a week. 11/07/22  Yes [provider]  acetaminophen (TYLENOL) 650 MG CR tablet Take 650 mg by mouth every 8 (eight) hours as needed for pain.    [provider]  albuterol (VENTOLIN HFA) 108 (90 Base) MCG/ACT inhaler Inhale 2 puffs into the lungs every 6 (six) hours as needed for wheezing or shortness of breath. Patient not taking: Reported on 11/05/2021 11/06/20   Katha Cabal, DO  betamethasone dipropionate 0.05 % cream Apply topically 2 (two) times daily. Hands. Do not touch your face or other sensitive areas after using the cream. 11/05/21   [name], DO  cetirizine (ZYRTEC) 10 MG tablet TAKE 1 TABLET BY MOUTH EVERY DAY 08/06/22   [name], DO  diclofenac sodium (VOLTAREN) 1 % GEL Apply 4 g topically 4  (four) times daily. 10/07/18   Shirley, [place], DO  empagliflozin (JARDIANCE) 10 MG TABS tablet Take 1 tablet (10 mg total) by mouth daily. 10/24/22   Dameron, Nolberto Hanlon, DO  EPINEPHrine 0.3 mg/0.3 mL IJ SOAJ injection Inject 0.3 mg into the muscle as needed for anaphylaxis. 11/05/21   [name], DO  fluticasone (FLONASE) 50 MCG/ACT nasal spray SHAKE LIQUID AND USE 2 SPRAYS IN [facility] NOSTRIL DAILY 04/28/21   Achille Rich, PA-C  hydrochlorothiazide (HYDRODIURIL) 25 MG tablet TAKE 1 TABLET(25 MG) BY MOUTH DAILY 08/05/22   [name], DO  ondansetron (ZOFRAN) 8 MG tablet TAKE 1 TABLET BY MOUTH EVERY 8 HOURS AS NEEDED FOR NAUSEA OR VOMITING. 10/03/22   [name], DO  pantoprazole (PROTONIX) 40 MG tablet Take 1 tablet (40 mg total) by mouth daily. 02/05/22   [name], DO  polyethylene glycol powder (GLYCOLAX/MIRALAX) 17 GM/SCOOP powder Take 17 g by mouth daily. 10/24/22   Dameron, Nolberto Hanlon, DO  pravastatin (PRAVACHOL) 10 MG tablet Take 2 tablets (20 mg total) by mouth at bedtime. 10/25/22   Dameron, Nolberto Hanlon, DO  umeclidinium-vilanterol (ANORO ELLIPTA) 62.5-25 MCG/ACT AEPB Inhale 1 puff into the lungs daily. Patient not taking: Reported on 11/05/2021 04/25/21   Jackelyn Poling, DO  zolpidem (AMBIEN) 10 MG tablet Take 1 tablet (10mg ) by mouth an hour before procedure with light food. 07/15/22   Juanda Chance, NP      Allergies    Bee venom,  Sulfa antibiotics, and Latex    Review of Systems   Review of Systems  Respiratory:  Positive for shortness of breath.     Physical Exam Updated Vital Signs BP 111/79   Pulse 81   Temp 98.4 F (36.9 C) (Oral)   Resp 14   Ht 5\' 4"  (1.626 m)   Wt 95.7 kg   SpO2 98%   BMI 36.22 kg/m  Physical Exam Vitals and nursing note reviewed.  Constitutional:      General: She is not in acute distress. HENT:     Head: Normocephalic and atraumatic.     Nose: Nose normal.     Mouth/Throat:     Mouth: Mucous membranes are dry.  Eyes:     General: No scleral  icterus. Cardiovascular:     Rate and Rhythm: Normal rate and regular rhythm.     Pulses: Normal pulses.     Heart sounds: Normal heart sounds.  Pulmonary:     Effort: Pulmonary effort is normal. No respiratory distress.     Breath sounds: Wheezing present.  Abdominal:     Palpations: Abdomen is soft.     Tenderness: There is no abdominal tenderness.  Musculoskeletal:     Cervical back: Normal range of motion.     Right lower leg: No edema.     Left lower leg: No edema.  Skin:    General: Skin is warm and dry.     Capillary Refill: Capillary refill takes less than 2 seconds.  Neurological:     Mental Status: She is alert. Mental status is at baseline.  Psychiatric:        Mood and Affect: Mood normal.        Behavior: Behavior normal.     ED Results / Procedures / Treatments   Labs (all labs ordered are listed, but only abnormal results are displayed) Labs Reviewed  SARS CORONAVIRUS 2 BY RT PCR - Abnormal; Notable for the following components:      Result Value   SARS Coronavirus 2 by RT PCR POSITIVE (*)    All other components within normal limits    EKG None  Radiology DG Chest Port 1 View  Result Date: 12/03/2022 CLINICAL DATA:  Cough. Shortness of breath. Body aches, congestion and headache. EXAM: PORTABLE CHEST 1 VIEW COMPARISON:  04/28/2021; 04/11/2021 FINDINGS: Unchanged enlarged cardiac silhouette and mediastinal contours. There is mild diffuse slightly nodular thickening of the pulmonary interstitium. No pleural effusion or pneumothorax. No acute osseous abnormalities. IMPRESSION: When correlated with provided history, findings most suggestive of atypical infection (including COVID-19) though conceivably mild pulmonary edema could have a similar appearance. Electronically Signed   By: Simonne Come M.D.   On: 12/03/2022 12:20    Procedures Procedures    Medications Ordered in ED Medications  albuterol (VENTOLIN HFA) 108 (90 Base) MCG/ACT inhaler 4 puff (4  puffs Inhalation Given 12/03/22 1055)  ibuprofen (ADVIL) tablet 600 mg (600 mg Oral Given 12/03/22 1050)  AeroChamber Plus Flo-Vu Medium MISC 1 each (1 each Other Given 12/03/22 1101)    ED Course/ Medical Decision Making/ A&P Clinical Course as of 12/03/22 1307  Tue Dec 03, 2022  1040 5 days of coughing, chest pain with breathing, no hemoptysis.  Yellow phlegm.  No vomiting. Some nausea since insulin 1 mo ago.  Sinus congestion too [WF]  1231 IMPRESSION: When correlated with provided history, findings most suggestive of atypical infection (including COVID-19) though conceivably mild pulmonary edema could have a similar  appearance.   [WF]    Clinical Course User Index [WF] Gailen Shelter, [place]                                 Medical Decision Making Amount and/or Complexity of Data Reviewed Radiology: ordered.  Risk Prescription drug management.   Patient is a 57 year old female with a past medical history significant for HTN, DM2, HLD, gallstones  She presents emergency room today with complaints of shortness of breath and cough for the past 2 days.  She states she has bodyaches and has some sinus congestion as well.  Has taken no medications today.  She denies any chest pain now but states that she feels sore when she coughs for an extended period of time.  Physical exam with faint end expiratory wheeze.  On reevaluation this is resolved after albuterol 4 puffs and ibuprofen.  She is well-appearing on exam, coughs occasionally, SpO2 98% on room air.  Vital signs normal.  Afebrile.  COVID PCR positive, chest x-ray unremarkable apart from evidence of atypical/viral infection.  I do not appreciate any crackles on my exam.  Radiology read included differential of pulmonary edema however this does not fit with patient's clinical presentation.  Will discharge home at this time. Prednisone, cough syrup, warm tea and honey.  Return precautions provided.  Final Clinical  Impression(s) / ED Diagnoses Final diagnoses:  COVID-19    Rx / DC Orders ED Discharge Orders          Ordered    predniSONE (DELTASONE) 10 MG tablet  Daily        12/03/22 1254    promethazine-dextromethorphan (PROMETHAZINE-DM) 6.25-15 MG/5ML syrup  4 times daily PRN        12/03/22 1304              Gailen Shelter, [place] 12/03/22 1309    Gwyneth Sprout, MD 12/04/22 1335

## 2022-12-19 ENCOUNTER — Telehealth: Payer: Self-pay | Admitting: Physical Medicine and Rehabilitation

## 2022-12-19 NOTE — Telephone Encounter (Signed)
Patient called advised she need to reschedule her appointment for the pain in her left buttocks which has moved down both legs. Patient said she canceled her appointment due to being sick. The number to contact patient is [phone]

## 2022-12-20 NOTE — Telephone Encounter (Signed)
Spoke with patient and rescheduled OV for 12/24/22.

## 2022-12-24 ENCOUNTER — Ambulatory Visit: Payer: 59 | Admitting: Physical Medicine and Rehabilitation

## 2022-12-24 DIAGNOSIS — M25571 Pain in right ankle and joints of right foot: Secondary | ICD-10-CM

## 2022-12-24 DIAGNOSIS — M7918 Myalgia, other site: Secondary | ICD-10-CM

## 2022-12-24 DIAGNOSIS — M461 Sacroiliitis, not elsewhere classified: Secondary | ICD-10-CM

## 2022-12-24 DIAGNOSIS — M25552 Pain in left hip: Secondary | ICD-10-CM | POA: Diagnosis not present

## 2022-12-24 NOTE — Progress Notes (Unsigned)
Functional Pain Scale - descriptive words and definitions  Moderate (4)   Constantly aware of pain, can complete ADLs with modification/sleep marginally affected at times/passive distraction is of no use, but active distraction gives some relief. Moderate range order  Average Pain 7   Left side buttocks pain to left ankle. Pain only, no numbness or tingling. Some relief with tylenol and ibuprofen

## 2022-12-26 ENCOUNTER — Encounter: Payer: Self-pay | Admitting: Physical Medicine and Rehabilitation

## 2022-12-26 NOTE — Progress Notes (Signed)
Lisa Sullivan - 57 y.o. female MRN [id number]  Date of birth: December 22, 1965  Office Visit Note: Visit Date: 12/24/2022 PCP: [name], DO Referred by: Adonis Huguenin, NP  Subjective: Chief Complaint  Patient presents with   Lower Back - Pain   HPI: Lisa Sullivan is a 57 y.o. female who comes in today for evaluation and management at the request of Barnie Del, FNP for chronic and worsening and severe low back pain with some referral into the hips.  Original pain was at the belt line a little bit above and we did complete facet joint block back in May and she did fairly well and got some relief with that injection.  Per her history she went back to see Dr. Lajoyce Corners and received a knee injection and she reports to me today that she was not very pleased with the knee injection itself and how it was performed and she does not want to see Dr. Lajoyce Corners again.  She called back in having a different type of back pain and so we are seeing her now for reevaluation.  She reports pain into the left buttock region and she points directly over the sacroiliac joint.  Positive Fortin finger test.  She has no pain on the right.  Pain is worse with going from sit to stand and going down into the sit position.  She has no pain past that area not into the hamstring or knee or leg.  MRI that was reviewed before is reviewed below in the notes in detail.  Basically this shows facet arthropathy without neural impingement with some small listhesis.  Again no nerve compression or stenosis.  She is not having thing in the way of paresthesias.  She rates her pain is quite severe to the buttock region.  Her average pain is 7 out of 10 and she has a 4 out of 10 on the pain and functional scale which limits her to some daily activities.  No focal weakness.  Her case is complicated by type 2 diabetes.  She has been using Tylenol and ibuprofen.  Secondarily, she is having a lot of left ankle pain.  She wonders if hobbling around with  antalgic gait on the left has caused her to have some pain in the buttock region.  She does not get any radicular pain down the leg and again MRI does not show any nerve involvement.       Review of Systems  Musculoskeletal:  Positive for back pain and joint pain.  All other systems reviewed and are negative.  Otherwise per HPI.  Assessment & Plan: Visit Diagnoses:    ICD-10-CM   1. Sacroiliitis (HCC)  M46.1 Ambulatory referral to Physical Medicine Rehab    2. Pain in left hip  M25.552 Ambulatory referral to Physical Medicine Rehab    3. Left buttock pain  M79.18 Ambulatory referral to Physical Medicine Rehab    4. Pain in right ankle and joints of right foot  M25.571 Ambulatory referral to Orthopedic Surgery       Plan: Findings:  1.  Chronic worsening axial low back pain now with pain into the left buttocks.  MRI showing facet arthropathy that got good relief with facet joint block in the past but now this is below the waist over the sacroiliac joint.  Physical exam with positive Fortin finger sign as well as positive Patrick's testing and lateral compression test.  I do think this may be sacroiliitis or sacroiliac  dysfunction.  I would recommend injection of the sacroiliac joint with fluoroscopic guidance.  I went over this at length with the patient today.  She reported wanting to do this but was unhappy that she was not going to have anything performed on the same day.  I did tell her we have to have that scheduled when I have an x-ray tech in the office.  I do not think this is radicular given the clinical exam findings and the MRI imaging.  2.  Left ankle pain with antalgic gait and reduced function.  She should follow-up with Dr. Audrie Lia group.  However, she does not want to see Dr. Lajoyce Corners.  She is willing to see Barnie Del, FNP.  I have placed a referral to have her to see Denny Peon for her ankle.    Meds & Orders: No orders of the defined types were placed in this encounter.    Orders Placed This Encounter  Procedures   Ambulatory referral to Physical Medicine Rehab   Ambulatory referral to Orthopedic Surgery    Follow-up: No follow-ups on file.   Procedures: No procedures performed      Clinical History: MRI LUMBAR SPINE WITHOUT CONTRAST   TECHNIQUE: Multiplanar, multisequence MR imaging of the lumbar spine was performed. No intravenous contrast was administered.   COMPARISON:  None Available.   FINDINGS: Segmentation:  Standard.   Alignment:  Mild dextrocurvature.   Vertebrae:  No fracture, evidence of discitis, or bone lesion.   Conus medullaris and cauda equina: Conus extends to the L1 level. Conus and cauda equina appear normal.   Paraspinal and other soft tissues: Negative for perispinal mass. Mild degenerative periarticular edema at L3-4 and L5-S1.   Disc levels:   L2-L3: Mild disc bulging with tiny left foraminal protrusion. No neural impingement   L3-L4: Mild disc bulging. Degenerative facet spurring to a moderate degree. No neural impingement   L4-L5: At least moderate degenerative facet spurring with mild anterolisthesis. Mild disc bulging. No neural impingement   L5-S1:At least moderate degenerative facet spurring. Preserved disc height and hydration. No neural impingement.   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 No neural impingement.     Electronically Signed   By: Tiburcio Pea M.D.   On: 07/03/2022 07:27   She reports that she has been smoking cigarettes. She started smoking about 39 years ago. She has a 9.9 pack-year smoking history. She has been exposed to tobacco smoke. She has never used smokeless tobacco.  Recent Labs    03/12/22 1503 09/02/22 1554  HGBA1C 6.6 6.4    Objective:  VS:  HT:    WT:   BMI:     BP:   HR: bpm  TEMP: ( )  RESP:  Physical Exam Vitals and nursing note reviewed.   Constitutional:      General: She is not in acute distress.    Appearance: Normal appearance. She is well-developed. She is obese. She is not ill-appearing.  HENT:     Head: Normocephalic and atraumatic.  Eyes:     Conjunctiva/sclera: Conjunctivae normal.     Pupils: Pupils are equal, round, and reactive to light.  Cardiovascular:     Rate and Rhythm: Normal rate.     Pulses: Normal pulses.  Pulmonary:     Effort: Pulmonary effort is normal.  Musculoskeletal:     Cervical back: Neck supple.     Right lower leg: No edema.  Left lower leg: No edema.     Comments: Patient has pain over the left sacroiliac joint with positive Fortin finger sign as well as positive Patrick's testing and lateral compression.  She has no groin pain.  She has good distal strength.  Examination briefly of the ankle does not show distinct swelling although seems to be a little more full left than right.  Skin:    General: Skin is warm and dry.     Findings: No erythema or rash.  Neurological:     General: No focal deficit present.     Mental Status: She is alert and oriented to person, place, and time.     Cranial Nerves: No cranial nerve deficit.     Sensory: No sensory deficit.     Motor: No weakness or abnormal muscle tone.     Coordination: Coordination normal.     Gait: Gait abnormal.  Psychiatric:        Mood and Affect: Mood normal.        Behavior: Behavior normal.     Ortho Exam  Imaging: No results found.  Past Medical/Family/Surgical/Social History: Medications & Allergies reviewed per EMR, new medications updated. 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   Past Medical History:  Diagnosis Date   Diabetes mellitus without complication (HCC)    Gall stone    Hyperlipidemia    Hypertension    Family History  Problem Relation Age of Onset   Liver cancer Mother    Diabetes Mother    Stroke Father    Breast cancer Paternal Grandmother    Stomach cancer Other    Rectal cancer Other    Esophageal cancer Other    Colon polyps Other    Colon cancer Other    Past Surgical History:  Procedure Laterality Date   CESAREAN SECTION     CHOLECYSTECTOMY     TUBAL LIGATION     Social History   Occupational History   Not on file  Tobacco Use   Smoking status: Some Days    Current packs/day: 0.25    Average packs/day: 0.3 packs/day for 39.8 years (9.9 ttl pk-yrs)    Types: Cigarettes    Start date: 03/26/1983    Passive exposure: Past   Smokeless tobacco: Never   Tobacco comments:    Less than five cigarettes a day.  Vaping Use   Vaping status: Never Used  Substance and Sexual Activity   Alcohol use: Yes    Comment: socially    Drug use: No   Sexual activity: Not on file

## 2022-12-27 ENCOUNTER — Other Ambulatory Visit: Payer: Self-pay

## 2022-12-27 MED ORDER — ONDANSETRON HCL 8 MG PO TABS: 8.0000 mg | ORAL_TABLET | Freq: Three times a day (TID) | ORAL | 0 refills | Status: DC | PRN

## 2023-01-02 ENCOUNTER — Ambulatory Visit (INDEPENDENT_AMBULATORY_CARE_PROVIDER_SITE_OTHER): Payer: 59 | Admitting: Physical Medicine and Rehabilitation

## 2023-01-02 ENCOUNTER — Other Ambulatory Visit: Payer: Self-pay

## 2023-01-02 DIAGNOSIS — M461 Sacroiliitis, not elsewhere classified: Secondary | ICD-10-CM | POA: Diagnosis not present

## 2023-01-02 NOTE — Progress Notes (Signed)
Functional Pain Scale - descriptive words and definitions  Distressing (6)    Pain is present/unable to complete most ADLs limited by pain/sleep is difficult and active distraction is only marginal. Moderate range order  Average Pain 7   +Driver, -BT, -Dye Allergies.  Lower back pain on left side

## 2023-01-04 ENCOUNTER — Other Ambulatory Visit: Payer: Self-pay | Admitting: Student

## 2023-01-06 NOTE — Telephone Encounter (Signed)
Duplicate. Filled 1 week ago

## 2023-01-08 ENCOUNTER — Other Ambulatory Visit (INDEPENDENT_AMBULATORY_CARE_PROVIDER_SITE_OTHER): Payer: 59

## 2023-01-08 ENCOUNTER — Ambulatory Visit: Payer: 59 | Admitting: Family

## 2023-01-08 ENCOUNTER — Encounter: Payer: Self-pay | Admitting: Family

## 2023-01-08 DIAGNOSIS — M25571 Pain in right ankle and joints of right foot: Secondary | ICD-10-CM

## 2023-01-08 NOTE — Progress Notes (Signed)
Office Visit Note   Patient: Lisa Sullivan           Date of Birth: 1965-08-21           MRN: [id number] Visit Date: 01/08/2023              Requested by: [name], DO [address] [place],  [place] [address] PCP: [name], DO  Chief Complaint  Patient presents with   Right Ankle - Pain      HPI: The patient is a 57 year old woman who presents complaining of gradually worsening ankle pain over the last weeks to months.  She has not had any recent injury however she did twist her ankle back in April when she stumbled over a toy on the floor at work.  She has been using ibuprofen without relief  No instability of the ankle primarily having lateral ankle pain some shooting pain deep inside the ankle no associated swelling  Assessment & Plan: Visit Diagnoses:  1. Pain in right ankle and joints of right foot     Plan: Offered Depo-Medrol injection of the right ankle for posttraumatic osteoarthritis patient declined.  She will use Aleve twice daily for the next 2 weeks.  Discussed supportive shoewear  Follow-Up Instructions: No follow-ups on file.   Right Ankle Exam   Tenderness  The patient is experiencing tenderness in the ATF and CF (Sinus Tarsi). Swelling: mild  Tests  Anterior drawer: negative  Other  Erythema: absent Sensation: normal Pulse: present       Patient is alert, oriented, no adenopathy, well-dressed, normal affect, normal respiratory effort.  Imaging: No results found. No images are attached to the encounter.  Labs: Lab Results  Component Value Date   HGBA1C 6.4 09/02/2022   HGBA1C 6.6 03/12/2022   HGBA1C 6.4 11/05/2021   REPTSTATUS 02/26/2007 FINAL 02/24/2007   GRAMSTAIN  02/24/2007    NO WBC SEEN RARE SQUAMOUS EPITHELIAL CELLS PRESENT FEW GRAM POSITIVE COCCI IN CLUSTERS IN PAIRS   CULT  02/24/2007    MODERATE METHICILLIN RESISTANT STAPHYLOCOCCUS AUREUS Note: RIFAMPIN AND GENTAMICIN SHOULD NOT BE USED AS SINGLE DRUGS  FOR TREATMENT OF STAPH INFECTIONS. Contact laboratory if Clindamycin results needed. [phone] CRITICAL RESULT CALLED TO, READ BACK BY AND VERIFIED WITH: TONY SILVIANO 02/26/07 1020 BY  SMITHERSJ   LABORGA METHICILLIN RESISTANT STAPHYLOCOCCUS AUREUS 02/24/2007     Lab Results  Component Value Date   ALBUMIN 4.3 01/20/2022   ALBUMIN 4.1 06/21/2019   ALBUMIN 4.2 09/02/2016    No results found for: "MG" No results found for: "VD25OH"  No results found for: "PREALBUMIN"    Latest Ref Rng & Units 01/20/2022   11:22 AM 12/26/2021    2:51 PM 04/11/2021    3:17 PM  CBC EXTENDED  WBC 4.0 - 10.5 K/uL 14.9  9.1  9.0   RBC 3.87 - 5.11 MIL/uL 4.85  5.00  4.89   Hemoglobin 12.0 - 15.0 g/dL 56.3  87.5  64.3   HCT 36.0 - 46.0 % 41.7  44.2  41.0   Platelets 150 - 400 K/uL 263  288  281   NEUT# 1.7 - 7.7 K/uL 10.5  5.2    Lymph# 0.7 - 4.0 K/uL 3.1  3.1       There is no height or weight on file to calculate BMI.  Orders:  Orders Placed This Encounter  Procedures   XR Ankle Complete Right   No orders of the defined types were placed in  this encounter.    Procedures: No procedures performed  Clinical Data: No additional findings.  ROS:  All other systems negative, except as noted in the HPI. Review of Systems  Objective: Vital Signs: There were no vitals taken for this visit.  Specialty Comments:  MRI LUMBAR SPINE WITHOUT CONTRAST   TECHNIQUE: Multiplanar, multisequence MR imaging of the lumbar spine was performed. No intravenous contrast was administered.   COMPARISON:  None Available.   FINDINGS: Segmentation:  Standard.   Alignment:  Mild dextrocurvature.   Vertebrae:  No fracture, evidence of discitis, or bone lesion.   Conus medullaris and cauda equina: Conus extends to the L1 level. Conus and cauda equina appear normal.   Paraspinal and other soft tissues: Negative for perispinal mass. Mild degenerative periarticular edema at L3-4 and L5-S1.   Disc  levels:   L2-L3: Mild disc bulging with tiny left foraminal protrusion. No neural impingement   L3-L4: Mild disc bulging. Degenerative facet spurring to a moderate degree. No neural impingement   L4-L5: At least moderate degenerative facet spurring with mild anterolisthesis. Mild disc bulging. No neural impingement   L5-S1:At least moderate degenerative facet spurring. Preserved disc height and hydration. No neural impingement.   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 No neural impingement.     Electronically Signed   By: Tiburcio Pea M.D.   On: 07/03/2022 07:27  PMFS History: Patient Active Problem List   Diagnosis Date Noted   Nausea 10/24/2022   Episodic lightheadedness 12/10/2021   Chronic obstructive pulmonary disease (HCC) 04/25/2021   Gastroesophageal reflux disease 11/10/2020   Anxiety 05/26/2020   Hyperlipidemia associated with type 2 diabetes mellitus (HCC) 10/20/2019   Insomnia 10/06/2019   Type 2 diabetes mellitus without complications (HCC) 06/18/2016   Depression 01/23/2015   Hypertension 02/28/2011   Goiter 02/28/2011   OBESITY, UNSPECIFIED 03/27/2010   SMOKER 03/27/2010   DYSHIDROTIC ECZEMA, HANDS 03/27/2010   Past Medical History:  Diagnosis Date   Diabetes mellitus without complication (HCC)    Gall stone    Hyperlipidemia    Hypertension     Family History  Problem Relation Age of Onset   Liver cancer Mother    Diabetes Mother    Stroke Father    Breast cancer Paternal Grandmother    Stomach cancer Other    Rectal cancer Other    Esophageal cancer Other    Colon polyps Other    Colon cancer Other     Past Surgical History:  Procedure Laterality Date   CESAREAN SECTION     CHOLECYSTECTOMY     TUBAL LIGATION     Social History   Occupational History   Not on file  Tobacco Use   Smoking status: Some Days    Current packs/day: 0.25     Average packs/day: 0.2 packs/day for 39.8 years (9.9 ttl pk-yrs)    Types: Cigarettes    Start date: 03/26/1983    Passive exposure: Past   Smokeless tobacco: Never   Tobacco comments:    Less than five cigarettes a day.  Vaping Use   Vaping status: Never Used  Substance and Sexual Activity   Alcohol use: Yes    Comment: socially    Drug use: No   Sexual activity: Not on file

## 2023-01-13 ENCOUNTER — Telehealth: Payer: Self-pay

## 2023-01-13 MED ORDER — METHYLPREDNISOLONE ACETATE 40 MG/ML IJ SUSP
40.0000 mg | INTRAMUSCULAR | Status: AC | PRN
Start: 2023-01-02 — End: 2023-01-02
  Administered 2023-01-02: 40 mg via INTRA_ARTICULAR

## 2023-01-13 MED ORDER — BUPIVACAINE HCL 0.5 % IJ SOLN
2.0000 mL | INTRAMUSCULAR | Status: AC | PRN
Start: 2023-01-02 — End: 2023-01-02
  Administered 2023-01-02: 2 mL via INTRA_ARTICULAR

## 2023-01-13 NOTE — Progress Notes (Signed)
Lisa Sullivan - 57 y.o. female MRN [id number]  Date of birth: [birth year]  Office Visit Note: Visit Date: 01/02/2023 PCP: [name], DO Referred by: [name], DO  Subjective: Chief Complaint  Patient presents with   Lower Back - Pain   HPI:  Lisa Sullivan is a 57 y.o. female who comes in today at the request of Ellin Goodie, FNP for planned Left anesthetic Sacroiliac joint arthrogram with fluoroscopic guidance.  The patient has failed conservative care including home exercise, medications, time and activity modification.  This injection will be diagnostic and hopefully therapeutic.  Please see requesting physician notes for further details and justification.   Positive Fortin finger sign, Patrick's testing, Gaenslen's  and lateral compression test.    ROS Otherwise per HPI.  Assessment & Plan: Visit Diagnoses:    ICD-10-CM   1. Sacroiliitis (HCC)  M46.1 XR C-ARM NO REPORT      Plan: No additional findings.   Meds & Orders: No orders of the defined types were placed in this encounter.   Orders Placed This Encounter  Procedures   Sacroiliac Joint Inj   XR C-ARM NO REPORT    Follow-up: No follow-ups on file.   Procedures: Sacroiliac Joint Inj Left on 01/02/2023 3:45 PM Indications: pain and diagnostic evaluation Details: 22 G 3.5 in needle, fluoroscopy-guided posterior approach Medications: 2 mL bupivacaine 0.5 %; 40 mg methylPREDNISolone acetate 40 MG/ML Outcome: tolerated well, no immediate complications  Sacroiliac Joint Intra-Articular Injection - Posterior Approach with Fluoroscopic Guidance   Position: PRONE  Additional Comments: Vital signs were monitored before and after the procedure. Patient was prepped and draped in the usual sterile fashion. The correct patient, procedure, and site was verified.   Injection Procedure Details:   Location/Site:  Sacroiliac joint  Needle size: 3.5 in Spinal Needle  Needle type: Spinal  Needle  Placement: Intra-articular  Findings:  -Comments: There was excellent flow of contrast producing a partial arthrogram of the sacroiliac joint.   Procedure Details: Starting with a 90 degree vertical and midline orientation the fluoroscope was tilted cranially 20 to 25 degrees and the target area of the inferior most part of the SI joint on the side mentioned above was visualized.  The soft tissues overlying this target were infiltrated with 4 ml. of 1% Lidocaine without Epinephrine. A #22 gauge spinal needle was inserted perpendicular to the fluoroscope table and advanced into the posterior inferior joint space using fluoroscopic guidance.  Position in the joint space was confirmed by obtaining a partial arthrogram using a 2 ml. volume of Isovue-250 contrast agent. After negative aspirate for gross pus or blood, the injectate was delivered to the joint. Radiographs were obtained for documentation purposes.   Additional Comments:   Dressing: Bandaid    Post-procedure details: Patient was observed during the procedure. Post-procedure instructions were reviewed.  Patient left the clinic in stable condition.    There was excellent flow of contrast producing a partial arthrogram of the sacroiliac joint.  Procedure, treatment alternatives, risks and benefits explained, specific risks discussed. Consent was given by the patient. Immediately prior to procedure a time out was called to verify the correct patient, procedure, equipment, support staff and site/side marked as required. Patient was prepped and draped in the usual sterile fashion.          Clinical History: MRI LUMBAR SPINE WITHOUT CONTRAST   TECHNIQUE: Multiplanar, multisequence MR imaging of the lumbar spine was performed. No intravenous contrast was administered.   COMPARISON:  None Available.   FINDINGS: Segmentation:  Standard.   Alignment:  Mild dextrocurvature.   Vertebrae:  No fracture, evidence of discitis, or  bone lesion.   Conus medullaris and cauda equina: Conus extends to the L1 level. Conus and cauda equina appear normal.   Paraspinal and other soft tissues: Negative for perispinal mass. Mild degenerative periarticular edema at L3-4 and L5-S1.   Disc levels:   L2-L3: Mild disc bulging with tiny left foraminal protrusion. No neural impingement   L3-L4: Mild disc bulging. Degenerative facet spurring to a moderate degree. No neural impingement   L4-L5: At least moderate degenerative facet spurring with mild anterolisthesis. Mild disc bulging. No neural impingement   L5-S1:At least moderate degenerative facet spurring. Preserved disc height and hydration. No neural impingement.   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 No neural impingement.     Electronically Signed   By: Tiburcio Pea M.D.   On: 07/03/2022 07:27     Objective:  VS:  HT:    WT:   BMI:     BP:   HR: bpm  TEMP: ( )  RESP:  Physical Exam   Imaging: No results found.

## 2023-01-13 NOTE — Telephone Encounter (Signed)
Patient calls nurse line in regards to diabetic medications.   She reports great difficulty being able to obtain injectables or Jardiance. She reports she has not been on "anything for diabetes" in over 2 months.   It appears [place] Pepper was approved by her insurance back in August, however I am unsure if the pharmacy was ever updated. See PA telephone note from 8/7.  I attempted to call [facility], however I had to LVM with their new system.   Will await their return call to discuss options.

## 2023-01-24 NOTE — Telephone Encounter (Signed)
Spoke with [facility] in regards to [place].   She reports with the PA approval the [place] Pepper is still 170.00 dollars.   She reports Trulicity is going through for 50.00 dollars.   Spoke with patient and she can not afford either one at this time. She reports she has nothing for diabetes.  Will forward to pharmacy team for assistance.

## 2023-01-27 NOTE — Telephone Encounter (Signed)
Called patient and advised of message per Dr. Royal Piedra.   Scheduled for 02/07/23.   Patient appreciative.   Veronda Prude, RN

## 2023-02-04 ENCOUNTER — Other Ambulatory Visit (HOSPITAL_COMMUNITY): Payer: Self-pay

## 2023-02-04 NOTE — Telephone Encounter (Signed)
Patient contacted for follow-up of need for support - cost of Jardiance (empagliflozin) and Trulicity (dulaglutide)  Patient informed we have vouchers for copay reduction coupons for her use.  Brief details provided on use.   Patient agreed to pick-up vouchers today. Placed at front desk.    Total time with patient call and documentation of interaction: 11 minutes.  F/U Phone call planned: None

## 2023-02-05 NOTE — Telephone Encounter (Signed)
Reviewed and agree with Dr Koval's plan.   

## 2023-02-07 ENCOUNTER — Encounter: Payer: Self-pay | Admitting: Student

## 2023-02-07 ENCOUNTER — Ambulatory Visit (INDEPENDENT_AMBULATORY_CARE_PROVIDER_SITE_OTHER): Payer: 59 | Admitting: Student

## 2023-02-07 VITALS — BP 124/75 | HR 77 | Ht 64.0 in | Wt 219.8 lb

## 2023-02-07 DIAGNOSIS — K219 Gastro-esophageal reflux disease without esophagitis: Secondary | ICD-10-CM | POA: Diagnosis not present

## 2023-02-07 DIAGNOSIS — E119 Type 2 diabetes mellitus without complications: Secondary | ICD-10-CM

## 2023-02-07 DIAGNOSIS — M549 Dorsalgia, unspecified: Secondary | ICD-10-CM | POA: Insufficient documentation

## 2023-02-07 DIAGNOSIS — R300 Dysuria: Secondary | ICD-10-CM | POA: Diagnosis not present

## 2023-02-07 LAB — POCT URINALYSIS DIP (MANUAL ENTRY)
Bilirubin, UA: NEGATIVE
Blood, UA: NEGATIVE
Glucose, UA: NEGATIVE mg/dL
Ketones, POC UA: NEGATIVE mg/dL
Leukocytes, UA: NEGATIVE
Nitrite, UA: NEGATIVE
Protein Ur, POC: NEGATIVE mg/dL
Spec Grav, UA: 1.025 (ref 1.010–1.025)
Urobilinogen, UA: 0.2 U/dL
pH, UA: 5.5 (ref 5.0–8.0)

## 2023-02-07 LAB — POCT GLYCOSYLATED HEMOGLOBIN (HGB A1C): HbA1c, POC (controlled diabetic range): 6.5 % (ref 0.0–7.0)

## 2023-02-07 LAB — GLUCOSE, POCT (MANUAL RESULT ENTRY): POC Glucose: 127 mg/dL — AB (ref 70–99)

## 2023-02-07 MED ORDER — TIZANIDINE HCL 2 MG PO TABS: 2.0000 mg | ORAL_TABLET | Freq: Two times a day (BID) | ORAL | 0 refills | Status: AC | PRN

## 2023-02-07 MED ORDER — PANTOPRAZOLE SODIUM 40 MG PO TBEC: 40.0000 mg | DELAYED_RELEASE_TABLET | Freq: Every day | ORAL | 3 refills | Status: AC

## 2023-02-07 NOTE — Assessment & Plan Note (Signed)
Acute, physical exam reassuring.  Suspect this will improve with conservative management.  Provided tizanidine 2 mg twice daily as needed for comfortable sleeping at night.

## 2023-02-07 NOTE — Patient Instructions (Addendum)
It was great to see you today! Thank you for choosing [facility] for your primary care.  Today we addressed: Your urine did not show signs of UTI.  Your A1c is 6.5, this is pretty well-controlled although I would recommend you restart Jardiance. Back pain: Suspect this is muscle strains.  It should get better with conservative management although to help you sleep I have prescribed tizanidine to take as needed.  If you haven't already, sign up for My Chart to have easy access to your labs results, and communication with your primary care physician.  Return if symptoms worsen or fail to improve. Please arrive 15 minutes before your appointment to ensure smooth check in process.  We appreciate your efforts in making this happen.  Thank you for allowing me to participate in your care, [name], DO 02/07/2023, 2:53 PM PGY-3, [facility]

## 2023-02-07 NOTE — Assessment & Plan Note (Signed)
well controlled.  This is in the setting of being off medications for 1 month.  Recommend restarting Jardiance 10 mg daily. - Last A1c: 6.5% Check urine micro albumin/creatinine ratio.

## 2023-02-07 NOTE — Assessment & Plan Note (Signed)
Stable.  Rx refill.

## 2023-02-07 NOTE — Progress Notes (Signed)
  SUBJECTIVE:   CHIEF COMPLAINT / HPI:   Type 2 Diabetes: Home medications include: Jardiance 10 mg daily,. Does not endorse compliance.  She is currently off Trulicity and has not started [place]. Patient is not up to date on diabetic eye.  Dysuria: She states she is having some itching with urination and request to be checked for UTI.  Back pain: She endorses mid back pain for the last 2 weeks.  Denies trauma.  States it is worse with positional changes in particular right rotation.  Denies shooting pain down her legs.  She has tried Tylenol, ibuprofen, Voltaren, numbing spray, lidocaine patch.  PERTINENT  PMH / PSH: T2DM, HTN, HLD, GERD  OBJECTIVE:  BP 124/75   Pulse 77   Ht 5\' 4"  (1.626 m)   Wt 219 lb 12.8 oz (99.7 kg)   SpO2 97%   BMI 37.73 kg/m  General: Well-appearing, NAD Back, thoracic:  - Inspection: no gross deformity or asymmetry, swelling or ecchymosis. No skin changes - Palpation: No TTP over the spinous processes, paraspinal muscles, or SI joints b/l - ROM: full active ROM of the thoracic spine in flexion and extension without pain, limited range of motion in right rotation and left sidebending  ASSESSMENT/PLAN:   Assessment & Plan Type 2 diabetes mellitus without complication, without long-term current use of insulin (HCC) well controlled.  This is in the setting of being off medications for 1 month.  Recommend restarting Jardiance 10 mg daily. - Last A1c: 6.5% Check urine micro albumin/creatinine ratio. Mid-back pain, acute Acute, physical exam reassuring.  Suspect this will improve with conservative management.  Provided tizanidine 2 mg twice daily as needed for comfortable sleeping at night. Dysuria UA unremarkable. Gastroesophageal reflux disease, unspecified whether esophagitis present Stable.  Rx refill. Return if symptoms worsen or fail to improve. [name], DO 02/07/2023, 5:46 PM PGY-3, [facility]

## 2023-02-09 LAB — MICROALBUMIN / CREATININE URINE RATIO
Creatinine, Urine: 131 mg/dL
Microalb/Creat Ratio: <2 mg/g{creat} (ref 0–29)
Microalbumin, Urine: <3 ug/mL

## 2023-02-12 DIAGNOSIS — M791 Myalgia, unspecified site: Secondary | ICD-10-CM | POA: Diagnosis not present

## 2023-02-12 DIAGNOSIS — M7918 Myalgia, other site: Secondary | ICD-10-CM | POA: Diagnosis not present

## 2023-02-12 DIAGNOSIS — T466X5A Adverse effect of antihyperlipidemic and antiarteriosclerotic drugs, initial encounter: Secondary | ICD-10-CM | POA: Diagnosis not present

## 2023-02-12 DIAGNOSIS — M461 Sacroiliitis, not elsewhere classified: Secondary | ICD-10-CM | POA: Diagnosis not present

## 2023-02-12 DIAGNOSIS — E782 Mixed hyperlipidemia: Secondary | ICD-10-CM | POA: Diagnosis not present

## 2023-02-12 DIAGNOSIS — E119 Type 2 diabetes mellitus without complications: Secondary | ICD-10-CM | POA: Diagnosis not present

## 2023-04-21 ENCOUNTER — Telehealth: Payer: Self-pay

## 2023-04-21 MED ORDER — ONDANSETRON 4 MG PO TBDP: 4.0000 mg | ORAL_TABLET | Freq: Three times a day (TID) | ORAL | 0 refills | Status: AC | PRN

## 2023-04-21 NOTE — Telephone Encounter (Signed)
Called patient and informed.   She was appreciative.   Veronda Prude, RN

## 2023-04-21 NOTE — Telephone Encounter (Signed)
Patient calls nurse line requesting refill on nausea medication.   Patient was previously being prescribed zofran 8 mg, however, this is no longer on her medication list.  Patient reports that she still experiences nausea while taking Jardiance. She is no longer taking Trulicity.   She denies fever, chills or abdominal pain.   Advised that I would forward request to PCP and that we would call her if she needed follow up appointment prior to this being prescribed.   Veronda Prude, RN

## 2023-04-22 ENCOUNTER — Telehealth: Payer: Self-pay | Admitting: Orthopedic Surgery

## 2023-04-22 NOTE — Telephone Encounter (Signed)
Pt called requesting to submit to insurance for right knee gel injection Monovisc. Please call pt when approved at [phone].

## 2023-04-23 ENCOUNTER — Encounter: Payer: Self-pay | Admitting: Physician Assistant

## 2023-04-23 ENCOUNTER — Ambulatory Visit (INDEPENDENT_AMBULATORY_CARE_PROVIDER_SITE_OTHER): Payer: No Typology Code available for payment source | Admitting: Physician Assistant

## 2023-04-23 DIAGNOSIS — M1711 Unilateral primary osteoarthritis, right knee: Secondary | ICD-10-CM | POA: Diagnosis not present

## 2023-04-23 MED ORDER — METHYLPREDNISOLONE ACETATE 40 MG/ML IJ SUSP
40.0000 mg | INTRAMUSCULAR | Status: AC | PRN
Start: 2023-04-23 — End: 2023-04-23
  Administered 2023-04-23: 40 mg via INTRA_ARTICULAR

## 2023-04-23 MED ORDER — LIDOCAINE HCL 1 % IJ SOLN
3.0000 mL | INTRAMUSCULAR | Status: AC | PRN
Start: 2023-04-23 — End: 2023-04-23
  Administered 2023-04-23: 3 mL

## 2023-04-23 NOTE — Progress Notes (Signed)
Office Visit Note   Patient: Lisa Sullivan           Date of Birth: Jan 16, 1966           MRN: [id number] Visit Date: 04/23/2023              Requested by: [name], DO [address] [place],  [place] [address] PCP: [name], DO  Chief Complaint  Patient presents with  . Right Knee - Pain      HPI: Lisa Sullivan is a 58 year old woman who is a patient of Erin Zamora's.  She periodically comes in and gets injections into her right knee.  She has had no new injury but is requesting an injection in her right knee today.  Denies any fever chills or calf pain  Assessment & Plan: Visit Diagnoses: Right knee pain  Plan: [name] forward with an injection today she may follow-up with Erin as needed  Follow-Up Instructions: No follow-ups on file.   Ortho Exam  Patient is alert, oriented, no adenopathy, well-dressed, normal affect, normal respiratory effort. Right knee no effusion she is neurovascular intact compartments are soft and compressible no evidence of infection or cellulitis no erythema  Imaging: No results found. No images are attached to the encounter.  Labs: Lab Results  Component Value Date   HGBA1C 6.5 02/07/2023   HGBA1C 6.4 09/02/2022   HGBA1C 6.6 03/12/2022   REPTSTATUS 02/26/2007 FINAL 02/24/2007   GRAMSTAIN  02/24/2007    NO WBC SEEN RARE SQUAMOUS EPITHELIAL CELLS PRESENT FEW GRAM POSITIVE COCCI IN CLUSTERS IN PAIRS   CULT  02/24/2007    MODERATE METHICILLIN RESISTANT STAPHYLOCOCCUS AUREUS Note: RIFAMPIN AND GENTAMICIN SHOULD NOT BE USED AS SINGLE DRUGS FOR TREATMENT OF STAPH INFECTIONS. Contact laboratory if Clindamycin results needed. [phone] CRITICAL RESULT CALLED TO, READ BACK BY AND VERIFIED WITH: TONY SILVIANO 02/26/07 1020 BY  SMITHERSJ   LABORGA METHICILLIN RESISTANT STAPHYLOCOCCUS AUREUS 02/24/2007     Lab Results  Component Value Date   ALBUMIN 4.3 01/20/2022   ALBUMIN 4.1 06/21/2019   ALBUMIN 4.2 09/02/2016    No results found  for: "MG" No results found for: "VD25OH"  No results found for: "PREALBUMIN"    Latest Ref Rng & Units 01/20/2022   11:22 AM 12/26/2021    2:51 PM 04/11/2021    3:17 PM  CBC EXTENDED  WBC 4.0 - 10.5 K/uL 14.9  9.1  9.0   RBC 3.87 - 5.11 MIL/uL 4.85  5.00  4.89   Hemoglobin 12.0 - 15.0 g/dL 10.2  72.5  36.6   HCT 36.0 - 46.0 % 41.7  44.2  41.0   Platelets 150 - 400 K/uL 263  288  281   NEUT# 1.7 - 7.7 K/uL 10.5  5.2    Lymph# 0.7 - 4.0 K/uL 3.1  3.1       There is no height or weight on file to calculate BMI.  Orders:  No orders of the defined types were placed in this encounter.  No orders of the defined types were placed in this encounter.    Procedures: Large Joint Inj: R knee on 04/23/2023 1:53 PM Indications: pain and diagnostic evaluation Details: 25 G 1.5 in needle, anteromedial approach  Arthrogram: No  Medications: 40 mg methylPREDNISolone acetate 40 MG/ML; 3 mL lidocaine 1 % Outcome: tolerated well, no immediate complications Procedure, treatment alternatives, risks and benefits explained, specific risks discussed. Consent was given by the patient.    Clinical Data: No additional findings.  ROS:  All other systems negative, except as noted in the HPI. Review of Systems  Objective: Vital Signs: There were no vitals taken for this visit.  Specialty Comments:  MRI LUMBAR SPINE WITHOUT CONTRAST   TECHNIQUE: Multiplanar, multisequence MR imaging of the lumbar spine was performed. No intravenous contrast was administered.   COMPARISON:  None Available.   FINDINGS: Segmentation:  Standard.   Alignment:  Mild dextrocurvature.   Vertebrae:  No fracture, evidence of discitis, or bone lesion.   Conus medullaris and cauda equina: Conus extends to the L1 level. Conus and cauda equina appear normal.   Paraspinal and other soft tissues: Negative for perispinal mass. Mild degenerative periarticular edema at L3-4 and L5-S1.   Disc levels:   L2-L3: Mild  disc bulging with tiny left foraminal protrusion. No neural impingement   L3-L4: Mild disc bulging. Degenerative facet spurring to a moderate degree. No neural impingement   L4-L5: At least moderate degenerative facet spurring with mild anterolisthesis. Mild disc bulging. No neural impingement   L5-S1:At least moderate degenerative facet spurring. Preserved disc height and hydration. No neural impingement.   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 No neural impingement.     Electronically Signed   By: Tiburcio Pea M.D.   On: 07/03/2022 07:27  PMFS History: Patient Active Problem List   Diagnosis Date Noted  . Mid-back pain, acute 02/07/2023  . Nausea 10/24/2022  . Episodic lightheadedness 12/10/2021  . Chronic obstructive pulmonary disease (HCC) 04/25/2021  . Gastroesophageal reflux disease 11/10/2020  . Anxiety 05/26/2020  . Hyperlipidemia associated with type 2 diabetes mellitus (HCC) 10/20/2019  . Insomnia 10/06/2019  . Type 2 diabetes mellitus without complications (HCC) 06/18/2016  . Depression 01/23/2015  . Hypertension 02/28/2011  . Goiter 02/28/2011  . OBESITY, UNSPECIFIED 03/27/2010  . SMOKER 03/27/2010  . 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 Diabetes mellitus without complication (HCC)   . Gall stone   . Hyperlipidemia   . Hypertension     Family History  Problem Relation Age of Onset  . Liver cancer Mother   . Diabetes Mother   . Stroke Father   . Breast cancer Paternal Grandmother   . Stomach cancer Other   . Rectal cancer Other   . Esophageal cancer Other   . Colon polyps Other   . Colon cancer Other     Past Surgical History:  Procedure Laterality Date  . CESAREAN SECTION    . CHOLECYSTECTOMY    . TUBAL LIGATION     Social History   Occupational History  . Not on file  Tobacco Use  . Smoking  status: Some Days    Current packs/day: 0.25    Average packs/day: 0.2 packs/day for 40.1 years (10.0 ttl pk-yrs)    Types: Cigarettes    Start date: 03/26/1983    Passive exposure: Past  . Smokeless tobacco: Never  . Tobacco comments:    Less than five cigarettes a day.  Vaping Use  . Vaping status: Never Used  Substance and Sexual Activity  . Alcohol use: Yes    Comment: socially   . Drug use: No  . Sexual activity: Not on file

## 2023-05-03 ENCOUNTER — Other Ambulatory Visit: Payer: Self-pay | Admitting: Student

## 2023-05-13 NOTE — Telephone Encounter (Signed)
VOB has been submitted for Monovisc, right knee.

## 2023-05-15 ENCOUNTER — Telehealth: Payer: Self-pay

## 2023-05-15 NOTE — Telephone Encounter (Signed)
 Faxed completed PA form to [employer] at [phone] and [phone] for Monovisc, right knee. PA pending

## 2023-05-23 ENCOUNTER — Telehealth: Payer: Self-pay

## 2023-05-23 NOTE — Telephone Encounter (Signed)
 Patient is denied for Monovisc, right knee per [employer].  Stated that patient must have tried/failed steroid injections on the affected knee at least 2 times and tried/failed euflexxa.  Will submit for Euflexxa, right knee. Authorization is required

## 2023-07-24 ENCOUNTER — Telehealth: Admitting: Physician Assistant

## 2023-07-24 DIAGNOSIS — S61411A Laceration without foreign body of right hand, initial encounter: Secondary | ICD-10-CM | POA: Diagnosis not present

## 2023-07-24 MED ORDER — AMOXICILLIN-POT CLAVULANATE 875-125 MG PO TABS: 1.0000 | ORAL_TABLET | Freq: Two times a day (BID) | ORAL | 0 refills | Status: AC

## 2023-07-24 NOTE — Progress Notes (Signed)
 E-Visit for Simple Cut/Laceration  We are sorry that you have had an injury. Here is how we plan to help!  Based on what you shared with me it looks like you have a simple laceration that does not need to be repaired with stitches or tissue glue.  and I have prescribed Augmentin  875-125 mg by mouth twice daily for two days  HOME CARE: Clean the cut or scrape - Wash it well with soap and water. * avoid using hydrogen peroxide which may cause tissue damage, or impede wound healing.  Stop the bleeding - If your cut or scrape is bleeding, press a clean cloth or bandage firmly on the area for 20 minutes. You can also help slow the bleeding by holding the cut above the level of your heart.   Put a thin layer of Bacitracin antibiotic ointment on the cut or scrape. (this can be purchased at any local pharmacy- ask your pharmacist if you need assistance)   Cover the cut or scrape with a bandage or gauze. Keep the bandage clean and dry. Change the bandage 1 to 2 times every day until your cut or scrape heals.   Watch for signs that your cut or scrape is infected (redness, drainage, pain, warmth, swelling or fever)  Over the next 48 hours your wound should start to improve with less pain, less swelling and less redness. If you should develop increasing pain, swelling, redness, fever, pus from the wound you should be seen immediately to make sure this is not becoming infected.   WOUND CARE: Please keep a layer of antibiotic ointment (bacitracin preferred) on this wound at least twice a day for the next seven days and keep a sterile dressing over top of it. You may gently clean the wound with warm soap and water between dressing changes.  We strongly recommend that you have a medical provider reevaluate your wound within 2 to 3 days in person to make sure that it is healing appropriately.  Thank you for choosing an e-visit.  Your e-visit answers were reviewed by a board certified advanced clinical  practitioner to complete your personal care plan. Depending upon the condition, your plan could have included both over the counter or prescription medications.  Please review your pharmacy choice. Make sure the pharmacy is open so you can pick up prescription now. If there is a problem, you may contact your provider through [employer] and have the prescription routed to another pharmacy.  Your safety is important to us . If you have drug allergies check your prescription carefully.   For the next 24 hours you can use MyChart to ask questions about today's visit, request a non-urgent call back, or ask for a work or school excuse. You will get an email in the next two days asking about your experience. I hope that your e-visit has been valuable and will speed your recovery.   I have spent 5 minutes in review of e-visit questionnaire, review and updating patient chart, medical decision making and response to patient.   Angelia Kelp, PA-C

## 2023-07-29 ENCOUNTER — Telehealth: Payer: Self-pay

## 2023-07-29 NOTE — Telephone Encounter (Signed)
 Insurance card uploaded for VOB through [employer] portal. Approval still pending

## 2023-07-31 ENCOUNTER — Other Ambulatory Visit: Payer: Self-pay | Admitting: Student

## 2023-07-31 ENCOUNTER — Other Ambulatory Visit: Payer: Self-pay | Admitting: Family Medicine

## 2023-07-31 DIAGNOSIS — M549 Dorsalgia, unspecified: Secondary | ICD-10-CM

## 2023-08-05 ENCOUNTER — Telehealth: Payer: Self-pay

## 2023-08-05 NOTE — Telephone Encounter (Signed)
 Office uploaded on [employer] portal for authorization. PA pending.

## 2023-08-08 ENCOUNTER — Other Ambulatory Visit: Payer: Self-pay

## 2023-08-08 ENCOUNTER — Telehealth: Payer: Self-pay

## 2023-08-08 DIAGNOSIS — M1711 Unilateral primary osteoarthritis, right knee: Secondary | ICD-10-CM

## 2023-08-08 NOTE — Telephone Encounter (Signed)
 Per [employer], patient is denied for [employer], right knee.  Stated that patient would have had to have at least 2 trials of oral alternatives and tried/failed 2 intraarticular steroid injections.  Patient has only had one of each.  See referrals tab for denial letter .  Please advise on next option for patient.

## 2023-08-20 ENCOUNTER — Other Ambulatory Visit: Payer: Self-pay | Admitting: Student

## 2023-08-20 DIAGNOSIS — I10 Essential (primary) hypertension: Secondary | ICD-10-CM

## 2023-09-29 ENCOUNTER — Ambulatory Visit (INDEPENDENT_AMBULATORY_CARE_PROVIDER_SITE_OTHER): Admitting: Physician Assistant

## 2023-09-29 ENCOUNTER — Encounter: Payer: Self-pay | Admitting: Physician Assistant

## 2023-09-29 ENCOUNTER — Telehealth: Payer: Self-pay

## 2023-09-29 ENCOUNTER — Other Ambulatory Visit (INDEPENDENT_AMBULATORY_CARE_PROVIDER_SITE_OTHER): Payer: Self-pay

## 2023-09-29 DIAGNOSIS — M25561 Pain in right knee: Secondary | ICD-10-CM

## 2023-09-29 DIAGNOSIS — G8929 Other chronic pain: Secondary | ICD-10-CM | POA: Diagnosis not present

## 2023-09-29 DIAGNOSIS — M25562 Pain in left knee: Secondary | ICD-10-CM

## 2023-09-29 MED ORDER — METHYLPREDNISOLONE ACETATE 40 MG/ML IJ SUSP
40.0000 mg | INTRAMUSCULAR | Status: AC | PRN
  Administered 2023-09-29: 40 mg via INTRA_ARTICULAR

## 2023-09-29 MED ORDER — LIDOCAINE HCL 1 % IJ SOLN
4.0000 mL | INTRAMUSCULAR | Status: AC | PRN
Start: 2023-09-29 — End: 2023-09-29
  Administered 2023-09-29: 4 mL

## 2023-09-29 NOTE — Progress Notes (Signed)
 Office Visit Note   Patient: Lisa Sullivan           Date of Birth: [birth year]           MRN: [id number] Visit Date: 09/29/2023              Requested by: No referring provider defined for this encounter. PCP: No primary care provider on file.   Assessment & Plan: Visit Diagnoses:  1. Chronic pain of right knee   2. Chronic pain of left knee     Plan: Bilateral knee arthritis.  She has done well with viscosupplementation in the past however in order to get these authorized she has to fail a trial of steroid injections twice.  We will go forward with steroid injections today can follow-up with us  as needed  Follow-Up Instructions: No follow-ups on file.   Orders:  Orders Placed This Encounter  Procedures  . XR KNEE 3 VIEW RIGHT  . XR KNEE 3 VIEW LEFT   No orders of the defined types were placed in this encounter.     Procedures: Large Joint Inj: bilateral knee on 09/29/2023 4:13 PM Indications: pain and diagnostic evaluation Details: 25 G 1.5 in needle, anteromedial approach  Arthrogram: No  Medications (Right): 4 mL lidocaine  1 %; 40 mg methylPREDNISolone  acetate 40 MG/ML Medications (Left): 4 mL lidocaine  1 %; 40 mg methylPREDNISolone  acetate 40 MG/ML Outcome: tolerated well, no immediate complications Procedure, treatment alternatives, risks and benefits explained, specific risks discussed. Consent was given by the patient.      Clinical Data: No additional findings.   Subjective: No chief complaint on file.   HPI Patient is a 58 year old woman who comes in today with a chief complaint of bilateral knee pain.  She is requesting injections into her knee if appropriate.  She is diabetic but her last hemoglobin A1c was 6.2 no injury no fever chills Review of Systems  All other systems reviewed and are negative.    Objective: Vital Signs: There were no vitals taken for this visit.  Physical Exam Constitutional:      Appearance: Normal appearance.   Pulmonary:     Effort: Pulmonary effort is normal.     Breath sounds: Normal breath sounds.  Neurological:     General: No focal deficit present.     Mental Status: She is alert and oriented to person, place, and time.  Psychiatric:        Mood and Affect: Mood normal.        Behavior: Behavior normal.     Ortho Exam Bilateral knees no effusion no erythema she has some varus alignment on the right knee with medial sided pain.  Positive crepitus right greater than left.  Compartments are soft and compressible no cellulitis neurovascular intact Specialty Comments:  MRI LUMBAR SPINE WITHOUT CONTRAST   TECHNIQUE: Multiplanar, multisequence MR imaging of the lumbar spine was performed. No intravenous contrast was administered.   COMPARISON:  None Available.   FINDINGS: Segmentation:  Standard.   Alignment:  Mild dextrocurvature.   Vertebrae:  No fracture, evidence of discitis, or bone lesion.   Conus medullaris and cauda equina: Conus extends to the L1 level. Conus and cauda equina appear normal.   Paraspinal and other soft tissues: Negative for perispinal mass. Mild degenerative periarticular edema at L3-4 and L5-S1.   Disc levels:   L2-L3: Mild disc bulging with tiny left foraminal protrusion. No neural impingement   L3-L4: Mild disc bulging. Degenerative facet spurring to  a moderate degree. No neural impingement   L4-L5: At least moderate degenerative facet spurring with mild anterolisthesis. Mild disc bulging. No neural impingement   L5-S1:At least moderate degenerative facet spurring. Preserved disc height and hydration. No neural impingement.   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 No neural impingement.     Electronically Signed   By: Dorn Roulette M.D.   On: 07/03/2022 07:27  Imaging: No results found.   PMFS History: Patient Active Problem List    Diagnosis Date Noted  . Mid-back pain, acute 02/07/2023  . Nausea 10/24/2022  . Episodic lightheadedness 12/10/2021  . Chronic obstructive pulmonary disease (HCC) 04/25/2021  . Gastroesophageal reflux disease 11/10/2020  . Anxiety 05/26/2020  . Hyperlipidemia associated with type 2 diabetes mellitus (HCC) 10/20/2019  . Insomnia 10/06/2019  . Type 2 diabetes mellitus without complications (HCC) 06/18/2016  . Depression 01/23/2015  . Hypertension 02/28/2011  . Goiter 02/28/2011  . OBESITY, UNSPECIFIED 03/27/2010  . SMOKER 03/27/2010  . 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 Diabetes mellitus without complication (HCC)   . Gall stone   . Hyperlipidemia   . Hypertension     Family History  Problem Relation Age of Onset  . Liver cancer Mother   . Diabetes Mother   . Stroke Father   . Breast cancer Paternal Grandmother   . Stomach cancer Other   . Rectal cancer Other   . Esophageal cancer Other   . Colon polyps Other   . Colon cancer Other     Past Surgical History:  Procedure Laterality Date  . CESAREAN SECTION    . CHOLECYSTECTOMY    . TUBAL LIGATION     Social History   Occupational History  . Not on file  Tobacco Use  . Smoking status: Some Days    Current packs/day: 0.25    Average packs/day: 0.3 packs/day for 40.5 years (10.1 ttl pk-yrs)    Types: Cigarettes    Start date: 03/26/1983    Passive exposure: Past  . Smokeless tobacco: Never  . Tobacco comments:    Less than five cigarettes a day.  Vaping Use  . Vaping status: Never Used  Substance and Sexual Activity  . Alcohol use: Yes    Comment: socially   . Drug use: No  . Sexual activity: Not on file

## 2023-10-01 NOTE — Telephone Encounter (Signed)
 Per patient's insurance, she would need to have tried/failed cortisone injections at least two times before being approved for gel injection.  Provider is aware.

## 2023-12-01 ENCOUNTER — Other Ambulatory Visit: Payer: Self-pay | Admitting: Family Medicine

## 2023-12-01 DIAGNOSIS — Z1231 Encounter for screening mammogram for malignant neoplasm of breast: Secondary | ICD-10-CM

## 2023-12-24 ENCOUNTER — Ambulatory Visit

## 2024-01-26 ENCOUNTER — Encounter: Payer: Self-pay | Admitting: Radiology

## 2024-04-02 ENCOUNTER — Ambulatory Visit: Admitting: Physician Assistant

## 2024-04-02 ENCOUNTER — Telehealth: Payer: Self-pay | Admitting: Orthopedic Surgery

## 2024-04-02 ENCOUNTER — Encounter: Payer: Self-pay | Admitting: Physician Assistant

## 2024-04-02 DIAGNOSIS — M170 Bilateral primary osteoarthritis of knee: Secondary | ICD-10-CM | POA: Insufficient documentation

## 2024-04-02 NOTE — Telephone Encounter (Signed)
 This patient has never seen Dr. Harden.

## 2024-04-02 NOTE — Progress Notes (Signed)
 "  Office Visit Note   Patient: Lisa Sullivan           Date of Birth: 19-Apr-1965           MRN: [id number] Visit Date: 04/02/2024              Requested by: No referring provider defined for this encounter. PCP: No primary care provider on file.  Chief Complaint  Patient presents with   Right Knee - Pain   Left Knee - Pain      HPI: Lisa Sullivan is a pleasant 59 year old woman who periodically comes in for steroid and viscosupplementation into her knees.  Last was over 6 months ago.  No new injury her right knee hurts more than her left  Assessment & Plan: Visit Diagnoses:  1. Primary osteoarthritis of both knees     Plan: Because she is diabetic we will go forward with injection into the right knee today can follow-up in 2 weeks.  Also was requested for viscosupplementation into both of her knees we will submit for preauthorization for gel injections This patient is diagnosed with osteoarthritis of the knee(s).    Radiographs show evidence of joint space narrowing, osteophytes, subchondral sclerosis and/or subchondral cysts.  This patient has knee pain which interferes with functional and activities of daily living.    This patient has experienced inadequate response, adverse effects and/or intolerance with conservative treatments such as acetaminophen , NSAIDS, topical creams, physical therapy or regular exercise, knee bracing and/or weight loss.   This patient has experienced inadequate response or has a contraindication to intra articular steroid injections for at least 3 months.   This patient is not scheduled to have a total knee replacement within 6 months of starting treatment with viscosupplementation.   Follow-Up Instructions: No follow-ups on file.   Ortho Exam  Patient is alert, oriented, no adenopathy, well-dressed, normal affect, normal respiratory effort. Bilateral knees no effusion no erythema compartments are soft and compressible she is neurovascular intact she  has positive grinding with range of motion    Imaging: No results found. No images are attached to the encounter.  Labs: Lab Results  Component Value Date   HGBA1C 6.5 02/07/2023   HGBA1C 6.4 09/02/2022   HGBA1C 6.6 03/12/2022   REPTSTATUS 02/26/2007 FINAL 02/24/2007   GRAMSTAIN  02/24/2007    NO WBC SEEN RARE SQUAMOUS EPITHELIAL CELLS PRESENT FEW GRAM POSITIVE COCCI IN CLUSTERS IN PAIRS   CULT  02/24/2007    MODERATE METHICILLIN RESISTANT STAPHYLOCOCCUS AUREUS Note: RIFAMPIN AND GENTAMICIN SHOULD NOT BE USED AS SINGLE DRUGS FOR TREATMENT OF STAPH INFECTIONS. Contact laboratory if Clindamycin results needed. [phone] CRITICAL RESULT CALLED TO, READ BACK BY AND VERIFIED WITH: TONY SILVIANO 02/26/07 1020 BY  SMITHERSJ   LABORGA METHICILLIN RESISTANT STAPHYLOCOCCUS AUREUS 02/24/2007     Lab Results  Component Value Date   ALBUMIN 4.3 01/20/2022   ALBUMIN 4.1 06/21/2019   ALBUMIN 4.2 09/02/2016    No results found for: MG No results found for: VD25OH  No results found for: PREALBUMIN    Latest Ref Rng & Units 01/20/2022   11:22 AM 12/26/2021    2:51 PM 04/11/2021    3:17 PM  CBC EXTENDED  WBC 4.0 - 10.5 K/uL 14.9  9.1  9.0   RBC 3.87 - 5.11 MIL/uL 4.85  5.00  4.89   Hemoglobin 12.0 - 15.0 g/dL 86.6  86.2  86.2   HCT 36.0 - 46.0 % 41.7  44.2  41.0  Platelets 150 - 400 K/uL 263  288  281   NEUT# 1.7 - 7.7 K/uL 10.5  5.2    Lymph# 0.7 - 4.0 K/uL 3.1  3.1       There is no height or weight on file to calculate BMI.  Orders:  No orders of the defined types were placed in this encounter.  No orders of the defined types were placed in this encounter.    Procedures: No procedures performed  Clinical Data: No additional findings.  ROS:  All other systems negative, except as noted in the HPI. Review of Systems  Objective: Vital Signs: There were no vitals taken for this visit.  Specialty Comments:  MRI LUMBAR SPINE WITHOUT CONTRAST    TECHNIQUE: Multiplanar, multisequence MR imaging of the lumbar spine was performed. No intravenous contrast was administered.   COMPARISON:  None Available.   FINDINGS: Segmentation:  Standard.   Alignment:  Mild dextrocurvature.   Vertebrae:  No fracture, evidence of discitis, or bone lesion.   Conus medullaris and cauda equina: Conus extends to the L1 level. Conus and cauda equina appear normal.   Paraspinal and other soft tissues: Negative for perispinal mass. Mild degenerative periarticular edema at L3-4 and L5-S1.   Disc levels:   L2-L3: Mild disc bulging with tiny left foraminal protrusion. No neural impingement   L3-L4: Mild disc bulging. Degenerative facet spurring to a moderate degree. No neural impingement   L4-L5: At least moderate degenerative facet spurring with mild anterolisthesis. Mild disc bulging. No neural impingement   L5-S1:At least moderate degenerative facet spurring. Preserved disc height and hydration. No neural impingement.   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 No neural impingement.     Electronically Signed   By: Dorn Roulette M.D.   On: 07/03/2022 07:27  PMFS History: Patient Active Problem List   Diagnosis Date Noted   Osteoarthritis of knees, bilateral 04/02/2024   Mid-back pain, acute 02/07/2023   Nausea 10/24/2022   Episodic lightheadedness 12/10/2021   Chronic obstructive pulmonary disease (HCC) 04/25/2021   Gastroesophageal reflux disease 11/10/2020   Anxiety 05/26/2020   Hyperlipidemia associated with type 2 diabetes mellitus (HCC) 10/20/2019   Insomnia 10/06/2019   Type 2 diabetes mellitus without complications (HCC) 06/18/2016   Depression 01/23/2015   Hypertension 02/28/2011   Goiter 02/28/2011   OBESITY, UNSPECIFIED 03/27/2010   SMOKER 03/27/2010   DYSHIDROTIC ECZEMA, HANDS 03/27/2010   Past Medical History:   Diagnosis Date   Diabetes mellitus without complication (HCC)    Gall stone    Hyperlipidemia    Hypertension     Family History  Problem Relation Age of Onset   Liver cancer Mother    Diabetes Mother    Stroke Father    Breast cancer Paternal Grandmother    Stomach cancer Other    Rectal cancer Other    Esophageal cancer Other    Colon polyps Other    Colon cancer Other     Past Surgical History:  Procedure Laterality Date   CESAREAN SECTION     CHOLECYSTECTOMY     TUBAL LIGATION     Social History   Occupational History   Not on file  Tobacco Use   Smoking status: Some Days    Current packs/day: 0.25    Average packs/day: 0.3 packs/day for 41.0 years (10.3 ttl pk-yrs)    Types: Cigarettes    Start date: 03/26/1983  Passive exposure: Past   Smokeless tobacco: Never   Tobacco comments:    Less than five cigarettes a day.  Vaping Use   Vaping status: Never Used  Substance and Sexual Activity   Alcohol use: Yes    Comment: socially    Drug use: No   Sexual activity: Not on file       "

## 2024-04-02 NOTE — Telephone Encounter (Signed)
 Pt came in to fill out FMLA Paper work and wants a Dr's note sating when he is supposed to be out of work. Call back number is [phone]/ [phone]

## 2024-04-20 ENCOUNTER — Ambulatory Visit: Payer: Self-pay | Admitting: Physician Assistant
